# Patient Record
Sex: Female | Born: 1950
Health system: Southern US, Community
[De-identification: ages and names within clinical notes are randomized; demographics above are authoritative.]

## PROBLEM LIST (undated history)

## (undated) DIAGNOSIS — T7840XA Allergy, unspecified, initial encounter: Secondary | ICD-10-CM

## (undated) DIAGNOSIS — J45909 Unspecified asthma, uncomplicated: Secondary | ICD-10-CM

## (undated) DIAGNOSIS — J329 Chronic sinusitis, unspecified: Secondary | ICD-10-CM

## (undated) DIAGNOSIS — D649 Anemia, unspecified: Secondary | ICD-10-CM

## (undated) HISTORY — DX: Allergy, unspecified, initial encounter: T78.40XA

## (undated) HISTORY — DX: Anemia, unspecified: D64.9

## (undated) HISTORY — PX: OTHER SURGICAL HISTORY: SHX169

## (undated) HISTORY — PX: POLYPECTOMY: SHX149

---

## 2004-09-04 ENCOUNTER — Emergency Department (HOSPITAL_COMMUNITY): Admission: EM | Admit: 2004-09-04 | Discharge: 2004-09-04 | Payer: Self-pay | Admitting: Emergency Medicine

## 2005-04-29 ENCOUNTER — Ambulatory Visit: Payer: Self-pay | Admitting: Gastroenterology

## 2005-05-12 ENCOUNTER — Ambulatory Visit: Payer: Self-pay | Admitting: Gastroenterology

## 2005-05-13 ENCOUNTER — Ambulatory Visit: Payer: Self-pay | Admitting: Gastroenterology

## 2005-05-18 ENCOUNTER — Emergency Department (HOSPITAL_COMMUNITY): Admission: EM | Admit: 2005-05-18 | Discharge: 2005-05-18 | Payer: Self-pay | Admitting: Emergency Medicine

## 2006-08-12 ENCOUNTER — Ambulatory Visit (HOSPITAL_COMMUNITY): Admission: RE | Admit: 2006-08-12 | Discharge: 2006-08-12 | Payer: Self-pay | Admitting: Otolaryngology

## 2006-08-12 ENCOUNTER — Encounter (INDEPENDENT_AMBULATORY_CARE_PROVIDER_SITE_OTHER): Payer: Self-pay | Admitting: Specialist

## 2007-03-03 ENCOUNTER — Other Ambulatory Visit: Admission: RE | Admit: 2007-03-03 | Discharge: 2007-03-03 | Payer: Self-pay | Admitting: Internal Medicine

## 2007-12-30 ENCOUNTER — Encounter: Admission: RE | Admit: 2007-12-30 | Discharge: 2007-12-30 | Payer: Self-pay | Admitting: Internal Medicine

## 2008-04-11 ENCOUNTER — Other Ambulatory Visit: Admission: RE | Admit: 2008-04-11 | Discharge: 2008-04-11 | Payer: Self-pay | Admitting: Internal Medicine

## 2009-04-16 ENCOUNTER — Other Ambulatory Visit: Admission: RE | Admit: 2009-04-16 | Discharge: 2009-04-16 | Payer: Self-pay | Admitting: Internal Medicine

## 2009-12-03 ENCOUNTER — Encounter: Admission: RE | Admit: 2009-12-03 | Discharge: 2009-12-03 | Payer: Self-pay | Admitting: Internal Medicine

## 2010-07-14 ENCOUNTER — Encounter: Payer: Self-pay | Admitting: Internal Medicine

## 2010-11-08 NOTE — Op Note (Signed)
[name], Thompson           ACCOUNT NO.:  [id number]   MEDICAL RECORD NO.:  [id number]          PATIENT TYPE:  AMB   LOCATION:  SDS                          FACILITY:  [facility]   PHYSICIAN:  Kinnie Scales. Annalee Genta, M.D.DATE OF BIRTH:  05/28/1951   DATE OF PROCEDURE:  08/12/2006  DATE OF DISCHARGE:                               OPERATIVE REPORT   PRE AND POSTOPERATIVE DIAGNOSIS AND INDICATIONS FOR SURGERY:  1. Chronic sinusitis.  2. Nasal polyposis with airway obstruction.  3. Inferior turbinate hypertrophy.  4. Reactive airway disease.   SURGICAL PROCEDURES:  1. Bilateral endoscopic sinus surgery with computer-assisted      navigation (Stealth) consisting of bilateral total ethmoidectomy,      bilateral maxillary antrostomy with removal of diseased tissue,      bilateral nasal frontal recess exploration and right sphenoidotomy      with removal of tissue.  2. Bilateral inferior turbinate reduction.   SURGEON:  Kinnie Scales. Annalee Genta, M.D.   COMPLICATIONS:  None.   ANESTHESIA:  General endotracheal.   ESTIMATED BLOOD LOSS:  Approximately 200 mL.   The patient transferred from the operating room to recovery room in  stable condition.   BRIEF HISTORY:  [name] is a 60 year old black female who is  referred for evaluation of nasal airway obstruction and chronic  sinusitis.  She had undergone prior sinus surgery in the 1980s with an  initial improvement in symptoms of nasal airway and obstruction.  Over  the last five years, she has had increasing nasal blockage and recurrent  sinusitis.  She has a history of allergies and reactive airway disease  and has had difficulty managing her asthma because of worsening sinus  symptoms.  Examination in the office revealed massive nasal polyposis  with near complete bilateral nasal airway obstruction.  She was treated  with antibiotic therapy and oral and topical steroids and seen for  follow-up.  She had some improvement in breathing  but continued to have,  anosmia, nasal congestion and airway obstruction.  A CT scan was  obtained in the Stealth format for intraoperative computer-assisted  navigation and this revealed massive nasal polyposis involving the  entire nasal cavity maxillary, ethmoid, frontal and right sphenoid  sinuses.  After treatment CT scan consistent with chronic sinusitis and  nasal polyposis.  With these findings the patient was counseled  regarding surgical intervention and the above surgical procedure was  recommended.  Risk and possible complications of this procedure  discussed in detail with the patient and her husband.  They understood  and concurred with our plan for surgery which is scheduled on elective  basis at [facility] on August 12, 2006.   SURGICAL PROCEDURES:  The patient brought to the operating room at [facility] on August 12, 2006, placed in supine position on the  operating table.  General endotracheal anesthesia was established  without difficulty.  When the patient was adequately anesthetized, her  nose injected with total of 7 mL of 1% lidocaine 1:100,000 dilution of  epinephrine injected into the intranasal polyps on the lateral nasal  wall, inferior turbinates via  transoral sphenopalatine block.  A total  of 7 mL was injected.  Bilateral Afrin soaked cottonoid pledgets were  placed for hemostasis.  The patient was positioned on the operating  table, prepped and draped in sterile fashion.  The Stealth navigation  headgear was applied and anatomic and surgical landmarks were identified  and confirmed.  The Stealth device was used throughout the surgical  procedure to facilitate removal of polyps in a safe fashion.   With the patient prepared for surgery, a 0 degree endoscope was used on  the right-hand side to begin the sinus portion of the operation. Using a  0 degree endoscope and a straight microdebrider, polypoid disease was  resected from entire  nasal cavity to the level of the middle meatus.  With the nasal cavity patent, dissection was then carried out to the  middle meatus and anterior ethmoid region.  The ethmoid bulla was  identified and dissection was carried out preserving the middle  turbinate from posterior to anterior through the ethmoid region. Stealth  was used for guidance and a 0 degree scope and straight microdebrider  was used to remove all polypoid disease.  The posterior roof of the  ethmoid sinus was identified.  Dissection was then carried from  posterior to anterior removing bony septations and polyps, nasal frontal  recess was completely occluded as was the entire frontal sinus.  A 45  degrees telescope was used in this area with a curved microdebrider to  remove polyps.  A large amount of thick, mucinous material was aspirated  from the frontal sinus and the nasal frontal recess was widely patent.  Attention was then turned to the lateral nasal wall where the natural  ostium of the maxillary sinus identified.  This completely occluded with  polyp disease.  Microdebrider was used to resect polyps from within the  sinus creating a widely patent maxillary sinus.  There was continued  polypoid disease within the superior nasal cavity and sphenoethmoid  recess.  This was resected using a 0 degrees telescope and the Stealth  for guidance.  Natural ostium of the sphenoid sinus was identified and  polypoid material was removed from within the sphenoid ostium and  sphenoid sinus.   Attention then turned the left nasal passageway.  Again using a 0  degrees telescope and microdebrider, extensive polypoid disease was  resected from the entire nasal cavity, creating a patent nasal cavity,  identifying the natural anatomy including the middle turbinate and  anterior ethmoid region.  Dissection was then carried out through the  ethmoid sinus from anterior to posterior removing bony septations and significant diseased  polyps.  The posterior ethmoid region was  identified and cleared of polypoid disease and then resection was  carried from posterior to anterior on the roof of the ethmoid using 45  degrees telescope and curved microdebrider.  The nasal frontal recess  was identified.  Again this was completely occluded with polypoid  disease and resection was undertaken.  There was thick, mucinous  material which was aspirated and sent to pathology for evaluation of  allergic fungal mucin.  Lateral nasal wall was then inspected and  polypoid disease was removed.  The natural ostium of the maxillary sinus  was completely occluded and intramaxillary sinus disease was resected  using a curved microdebrider creating a widely patent maxillary sinus.   Attention was then turned to the inferior turbinates where bilateral  inferior turbinate reduction was performed.  Two passes were made in  submucosal  fashion with the bipolar cautery set at 12 watts.  The  turbinates were then outfractured and a small anterior incision was  created and turbinate bone was resected preserving the overlying mucosa.   With the sinus procedure completed, cottonoid pledgets were removed.  Sponge count was correct.  The patient's nasal cavity was thoroughly  irrigated and cleared of surgical debris.  A 50/50 mixture of Kenalog 40  and Bactroban cream was then instilled in the frontal ethmoid and  maxillary sinuses bilaterally and bilateral Kennedy sinus packs were  placed after the application of Bactroban ointment and hydrated with  sterile saline.  Oral cavity and  oropharynx were irrigated and suctioned.  Orogastric tube was passed and  the stomach contents were aspirated.  The patient awakened from her  anesthetic, extubated and transferred to the operating room to recovery  room in stable condition.  No complications.  Blood loss approximately  200 mL.           ______________________________  Kinnie Scales. Annalee Genta,  M.D.     DLS/MEDQ  D:  41/32/4401  T:  08/12/2006  Job:  [id number]

## 2010-12-26 ENCOUNTER — Other Ambulatory Visit: Payer: Self-pay | Admitting: Internal Medicine

## 2010-12-26 DIAGNOSIS — Z1231 Encounter for screening mammogram for malignant neoplasm of breast: Secondary | ICD-10-CM

## 2011-01-29 ENCOUNTER — Ambulatory Visit: Payer: Self-pay

## 2011-02-05 ENCOUNTER — Ambulatory Visit: Payer: Self-pay

## 2011-02-19 ENCOUNTER — Ambulatory Visit
Admission: RE | Admit: 2011-02-19 | Discharge: 2011-02-19 | Disposition: A | Discharge status: Home / Self Care | Payer: BC Managed Care – PPO | Source: Ambulatory Visit | Attending: Internal Medicine | Admitting: Internal Medicine

## 2011-02-19 DIAGNOSIS — Z1231 Encounter for screening mammogram for malignant neoplasm of breast: Secondary | ICD-10-CM

## 2012-03-15 ENCOUNTER — Other Ambulatory Visit: Payer: Self-pay | Admitting: Internal Medicine

## 2012-03-15 DIAGNOSIS — Z1231 Encounter for screening mammogram for malignant neoplasm of breast: Secondary | ICD-10-CM

## 2012-04-16 ENCOUNTER — Ambulatory Visit: Payer: BC Managed Care – PPO

## 2012-05-19 ENCOUNTER — Ambulatory Visit
Admission: RE | Admit: 2012-05-19 | Discharge: 2012-05-19 | Disposition: A | Discharge status: Home / Self Care | Payer: BC Managed Care – PPO | Source: Ambulatory Visit | Attending: Internal Medicine | Admitting: Internal Medicine

## 2012-05-19 DIAGNOSIS — Z1231 Encounter for screening mammogram for malignant neoplasm of breast: Secondary | ICD-10-CM

## 2012-07-01 ENCOUNTER — Other Ambulatory Visit: Payer: Self-pay | Admitting: Internal Medicine

## 2012-07-01 ENCOUNTER — Other Ambulatory Visit (HOSPITAL_COMMUNITY)
Admission: RE | Admit: 2012-07-01 | Discharge: 2012-07-01 | Disposition: A | Discharge status: Home / Self Care | Payer: BC Managed Care – PPO | Source: Ambulatory Visit | Attending: Internal Medicine | Admitting: Internal Medicine

## 2012-07-01 DIAGNOSIS — Z1151 Encounter for screening for human papillomavirus (HPV): Secondary | ICD-10-CM | POA: Insufficient documentation

## 2012-07-01 DIAGNOSIS — Z01419 Encounter for gynecological examination (general) (routine) without abnormal findings: Secondary | ICD-10-CM | POA: Insufficient documentation

## 2013-07-05 ENCOUNTER — Encounter (HOSPITAL_COMMUNITY): Payer: Self-pay | Admitting: Emergency Medicine

## 2013-07-05 ENCOUNTER — Emergency Department (HOSPITAL_COMMUNITY)
Admission: EM | Admit: 2013-07-05 | Discharge: 2013-07-05 | Disposition: A | Discharge status: Home / Self Care | Payer: Worker's Compensation | Attending: Emergency Medicine | Admitting: Emergency Medicine

## 2013-07-05 DIAGNOSIS — J45909 Unspecified asthma, uncomplicated: Secondary | ICD-10-CM | POA: Insufficient documentation

## 2013-07-05 DIAGNOSIS — X58XXXA Exposure to other specified factors, initial encounter: Secondary | ICD-10-CM | POA: Insufficient documentation

## 2013-07-05 DIAGNOSIS — Z23 Encounter for immunization: Secondary | ICD-10-CM | POA: Insufficient documentation

## 2013-07-05 DIAGNOSIS — Y9289 Other specified places as the place of occurrence of the external cause: Secondary | ICD-10-CM | POA: Insufficient documentation

## 2013-07-05 DIAGNOSIS — S058X9A Other injuries of unspecified eye and orbit, initial encounter: Secondary | ICD-10-CM | POA: Insufficient documentation

## 2013-07-05 DIAGNOSIS — Y9389 Activity, other specified: Secondary | ICD-10-CM | POA: Insufficient documentation

## 2013-07-05 DIAGNOSIS — IMO0002 Reserved for concepts with insufficient information to code with codable children: Secondary | ICD-10-CM | POA: Insufficient documentation

## 2013-07-05 DIAGNOSIS — S0500XA Injury of conjunctiva and corneal abrasion without foreign body, unspecified eye, initial encounter: Secondary | ICD-10-CM

## 2013-07-05 DIAGNOSIS — Z79899 Other long term (current) drug therapy: Secondary | ICD-10-CM | POA: Insufficient documentation

## 2013-07-05 HISTORY — DX: Unspecified asthma, uncomplicated: J45.909

## 2013-07-05 MED ORDER — TETRACAINE HCL 0.5 % OP SOLN
2.0000 [drp] | Freq: Once | OPHTHALMIC | Status: AC
  Administered 2013-07-05: 2 [drp] via OPHTHALMIC
  Filled 2013-07-05: qty 2

## 2013-07-05 MED ORDER — FLUORESCEIN SODIUM 1 MG OP STRP
1.0000 | ORAL_STRIP | Freq: Once | OPHTHALMIC | Status: AC
  Administered 2013-07-05: 1 via OPHTHALMIC
  Filled 2013-07-05: qty 1

## 2013-07-05 MED ORDER — TETANUS-DIPHTH-ACELL PERTUSSIS 5-2.5-18.5 LF-MCG/0.5 IM SUSP
0.5000 mL | Freq: Once | INTRAMUSCULAR | Status: AC
  Administered 2013-07-05: 0.5 mL via INTRAMUSCULAR
  Filled 2013-07-05: qty 0.5

## 2013-07-05 MED ORDER — CIPROFLOXACIN HCL 0.3 % OP SOLN: OPHTHALMIC | Status: DC

## 2013-07-05 NOTE — Progress Notes (Signed)
[facility] provided pt with a list of primary care resources and [facility] information.  °
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
## 2013-07-05 NOTE — Discharge Instructions (Signed)
Please call and set-up an appointment with eye doctor, please call first thing tomorrow morning Please rest and stay hydrated Please avoid rubbing the eye Please apply cool compressions Please take medication drops as prescribed Please continue to monitor symptoms closely and if symptoms are to worsen or change (fever greater than 101, neck pain, neck stiffness, swelling to the eye, tearing, drainage from the eye, pus drainage from the eye, redness, worsening pain, inability to see out of the eye, loss of vision, changes to vision) please report back to the ED   Corneal Abrasion The cornea is the clear covering at the front and center of the eye. When looking at the colored portion of the eye (iris), you are looking through the cornea. This very thin tissue is made up of many layers. The surface layer is a single layer of cells (corneal epithelium) and is one of the most sensitive tissues in the body. If a scratch or injury causes the corneal epithelium to come off, it is called a corneal abrasion. If the injury extends to the tissues below the epithelium, the condition is called a corneal ulcer. CAUSES   Scratches.  Trauma.  Foreign body in the eye. Some people have recurrences of abrasions in the area of the original injury even after it has healed (recurrent erosion syndrome). Recurrent erosion syndrome generally improves and goes away with time. SYMPTOMS   Eye pain.  Difficulty or inability to keep the injured eye open.  The eye becomes very sensitive to light.  Recurrent erosions tend to happen suddenly, first thing in the morning, usually after waking up and opening the eye. DIAGNOSIS  Your health care provider can diagnose a corneal abrasion during an eye exam. Dye is usually placed in the eye using a drop or a small paper strip moistened by your tears. When the eye is examined with a special light, the abrasion shows up clearly because of the dye. TREATMENT   Small abrasions  may be treated with antibiotic drops or ointment alone.  Usually a pressure patch is specially applied. Pressure patches prevent the eye from blinking, allowing the corneal epithelium to heal. A pressure patch also reduces the amount of pain present in the eye during healing. Most corneal abrasions heal within 2 3 days with no effect on vision. If the abrasion becomes infected and spreads to the deeper tissues of the cornea, a corneal ulcer can result. This is serious because it can cause corneal scarring. Corneal scars interfere with light passing through the cornea and cause a loss of vision in the involved eye. HOME CARE INSTRUCTIONS  Use medicine or ointment as directed. Only take over-the-counter or prescription medicines for pain, discomfort, or fever as directed by your health care provider.  Do not drive or operate machinery while your eye is patched. Your ability to judge distances is impaired.  If your health care provider has given you a follow-up appointment, it is very important to keep that appointment. Not keeping the appointment could result in a severe eye infection or permanent loss of vision. If there is any problem keeping the appointment, let your health care provider know. SEEK MEDICAL CARE IF:   You have pain, light sensitivity, and a scratchy feeling in one eye or both eyes.  Your pressure patch keeps loosening up, and you can blink your eye under the patch after treatment.  Any kind of discharge develops from the eye after treatment or if the lids stick together in the morning.  You have the same symptoms in the morning as you did with the original abrasion days, weeks, or months after the abrasion healed. MAKE SURE YOU:   Understand these instructions.  Will watch your condition.  Will get help right away if you are not doing well or get worse. Document Released: 06/06/2000 Document Revised: 03/30/2013 Document Reviewed: 02/14/2013 [facility] Patient Information  2014 [employer], [place].   Emergency Department Resource Guide 1) Find a Doctor and Pay Out of Pocket Although you won't have to find out who is covered by your insurance plan, it is a good idea to ask around and get recommendations. You will then need to call the office and see if the doctor you have chosen will accept you as a new patient and what types of options they offer for patients who are self-pay. Some doctors offer discounts or will set up payment plans for their patients who do not have insurance, but you will need to ask so you aren't surprised when you get to your appointment.  2) Contact Your Local Health Department Not all health departments have doctors that can see patients for sick visits, but many do, so it is worth a call to see if yours does. If you don't know where your local health department is, you can check in your phone book. The CDC also has a tool to help you locate your state's health department, and many state websites also have listings of all of their local health departments.  3) Find a Walk-in Clinic If your illness is not likely to be very severe or complicated, you may want to try a walk in clinic. These are popping up all over the country in pharmacies, drugstores, and shopping centers. They're usually staffed by nurse practitioners or physician assistants that have been trained to treat common illnesses and complaints. They're usually fairly quick and inexpensive. However, if you have serious medical issues or chronic medical problems, these are probably not your best option.  No Primary Care Doctor: - Call Health Connect at  [phone] - they can help you locate a primary care doctor that  accepts your insurance, provides certain services, etc. - Physician Referral Service- [phone]  Chronic Pain Problems: Organization         Address  Phone   Notes  [facility]  [phone] Patients need to be referred by their primary care doctor.    Medication Assistance: Organization         Address  Phone   Notes  [facility] [address] [place], [place] [address] [phone] --Must be a resident of [facility] [place] -- Must have NO insurance coverage whatsoever (no Medicaid/ Medicare, etc.) -- The pt. MUST have a primary care doctor that directs their care regularly and follows them in the community   MedAssist  [phone]   [employer]  [phone]    Agencies that provide inexpensive medical care: Organization         Address  Phone   Notes  [facility]  [phone]   [facility]    [phone]   [facility] [address] [place], [place] [address] [phone]) [phone]   [facility] [address], [place] [phone]   [facility]anned [facility]    [phone]   [facility]    [phone]   [facility]  [phone]  [address], [place] Phone:  [phone], Fax:  [phone] Hours of Operation:  9 am - 6 pm, M-F.  Also accepts Medicaid/Medicare and self-pay.  [facility]  [address], [place] Phone: [phone], Fax: [phone]. Hours of Operation:  8:30 am - 5:30 pm, M-F.  Also accepts Medicaid and self-pay.  [facility] [place] [address], [place] Phone: [phone]   [facility] [address] Natasha Bence [place], [place] [phone], Ext. [phone] Mondays & Thursdays: 7-9 AM.  First 15 patients are seen on a first come, first serve basis.    Medicaid-accepting [facility] Providers:  Organization         Address  Phone   Notes  [facility] [address], [place] [phone] Also accepts self-pay patients.  [facility] [address] Laurell Josephs [place], [place]  [phone]   [facility] [address], [place] [phone]   [facility] [address] [place] [phone]   Renaye Rakers [address], [place]   [phone] Only accepts [place] Access [place] patients after they have their name applied to their card.   Self-Pay (no insurance) in [facility]:  Organization         Address  Phone   Notes  Sickle Cell Patients, [facility] [address] [place], [place] [phone]   [facility] [address] [place], [place] [phone]   [facility]  [address], [place] [phone]   [facility]/Dr. Osei-Bonsu  [address], [place] or [address], [place] [phone] Phone number for both [place] and [place] locations is the same.  Urgent Medical and [facility] [address], [place] [phone]   [facility] [address], [place] or [address] [phone]   [facility] [address] [place] [phone], phone; [phone], fax Sees patients 1st and 3rd Saturday of every month.  Must not qualify for public or private insurance (i.e. Medicaid, Medicare, [place] Health Choice, Veterans' Benefits)  Household income should be no more than 200% of the poverty level The clinic cannot treat you if you are pregnant or think you are pregnant  Sexually transmitted diseases are not treated at the clinic.    Dental Care: Organization         Address  Phone  Notes  [facility] Department of [facility] [address] [place], [place] [phone] Accepts children up to age 2 who are enrolled in [place] or [place] Health Choice; pregnant women with a Medicaid card; and children who have applied for Medicaid or [place] Health Choice, but were declined, whose parents can pay a reduced fee at time of service.    [facility] Department of [facility]  [address], [place] [phone] Accepts children up to age 81 who are enrolled in [place] or [place] Health Choice; pregnant women with a Medicaid card; and children who have applied for Medicaid or [place] Health Choice, but were declined, whose parents can pay a reduced fee at time of service.  [facility] Adult Dental Access PROGRAM  [address] [place], [place] [phone]  Patients are seen by appointment only. Walk-ins are not accepted. [place] will see patients 27 years of age and older. Monday - Tuesday (8am-5pm) Most Wednesdays (8:30-5pm) $30 per visit, cash only  [facility] Adult [facility] Access PROGRAM  [address], [facility] [phone] Patients are seen by appointment only. Walk-ins are not accepted. [place] will see patients 61 years of age and older. One Wednesday Evening (Monthly: Volunteer Based).  $30 per visit, cash only  [place]  [phone] for adults; Children under age 42, call Graduate Pediatric Dentistry at [phone]. Children aged 64-14, please call [phone] to request a pediatric application.  Dental services are provided in all areas of dental care including fillings, crowns and bridges, complete and partial dentures, implants, gum treatment, root canals, and extractions. Preventive care is also provided. Treatment is provided to both adults and children. Patients are selected via a lottery and there is often a waiting list.   [facility] [address], [place]  [phone] [email or web address]   [facility] [address] [place], [place] [phone], Ext. [phone] Second and Fourth Thursday of each month, opens at 6:30 AM; Clinic ends at 9 AM.  Patients are seen on a first-come first-served basis, and a limited number are seen during each clinic.   [facility]  [address] Hillard Danker [place], [place] [phone]   Eligibility Requirements You must have lived in [place], [place], or [place] counties for at least the last three months.   You cannot be eligible for state or federal sponsored [employer], including [employer], [place], or [employer].   You generally cannot be eligible for healthcare insurance through your employer.    How to apply: Eligibility screenings are held every Tuesday and Wednesday afternoon from 1:00 pm until 4:00 pm. You do not need an appointment for the interview!  [facility] [address], [place], [place]   [place]  [place] Department  [place]  [phone]    Behavioral Health Resources in the Community: Intensive Outpatient Programs Organization         Address  Phone  Notes  [facility] [place]. [address] [place], [place] [phone]   [facility] [address] [place], [place]   ADS: Alcohol & Drug Svcs [address] [place], [place]   [place] [address],  [place], [place] or [phone]   Substance Abuse Resources Organization         Address  Phone  Notes  Alcohol and Drug Services  [phone]   [place]  [phone]   [facility] [place]   Chinita Pester  [phone]   Residential & Outpatient Substance Abuse Program  [phone]   Psychological Services Organization         Address  Phone  Notes  [facility] [place]  [place]  [phone]   [place] [address] [place] or [phone]    Mobile Crisis Teams Organization         Address  Phone  Notes  [facility], Mobile Crisis Care Unit  [phone]   [facility]  [address] [place], [place]   [facility] [address], [place]  [place] [phone]    Self-Help/Support Groups Organization         Address  Phone             Notes  [facility]ental Health Assoc[facility] of [place] - variety of support groups  [place] Call for more information  Narcotics Anonymous (NA), Caring [facility] [address], [employer] [place]  2 meetings at this location   Special educational needs teacher         Address  Phone  Notes  [facility] [place],    [place]  [phone]   [facility]  [address] [place] [id number], [place], [place]   [place], [place] [phone] Admissions: 8am-3pm M-F  Incentives Substance [place] [address]    [place], [place] [phone]   [facility] [address] [place], [place], [place]   [facility] [address]  [place], [place]   [facility] Programs - Intensive Outpatient [address] Kristeen [address], [place], [place]   [facility] [place].,  [place], [place] [phone] or [phone]   [facility] (RTS) [address], [place], [place] Accepts Medicaid  [facility] [address],  [place] [phone] Substance Abuse/Addiction Treatment   [facility] Organization         Address  Phone  Notes  [facility]  [phone]   Domenic Schwab, PhD [address] Arlis Porta [place], [place]   [phone] or [phone]   [facility] [place], [place] [phone]   [facility] [address], [place], [place] [phone] Insurance/Medicaid/sponsorship through [facility] and Families [address]                                    [place], [place] [phone]  [place] [address], [place] [phone]    Dr. Adele Schilder  [phone]   [facility] Dept. 1) [address] [place] 2) [place] 3)  [address], [place] [phone]  [phone]   [place] [phone] or [phone] (After Hours)
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
## 2013-07-08 NOTE — ED Provider Notes (Signed)
Medical screening examination/treatment/procedure(s) were performed by non-physician practitioner and as supervising physician I was immediately available for consultation/collaboration.  EKG Interpretation   None         Ephraim Hamburger, MD 07/08/13 [id number]

## 2013-07-15 ENCOUNTER — Other Ambulatory Visit: Payer: Self-pay

## 2013-07-15 DIAGNOSIS — Z1231 Encounter for screening mammogram for malignant neoplasm of breast: Secondary | ICD-10-CM

## 2013-07-31 ENCOUNTER — Emergency Department (HOSPITAL_COMMUNITY)
Admission: EM | Admit: 2013-07-31 | Discharge: 2013-07-31 | Disposition: A | Discharge status: Home / Self Care | Payer: Medicare Other | Attending: Emergency Medicine | Admitting: Emergency Medicine

## 2013-07-31 ENCOUNTER — Encounter (HOSPITAL_COMMUNITY): Payer: Self-pay | Admitting: Emergency Medicine

## 2013-07-31 DIAGNOSIS — J45909 Unspecified asthma, uncomplicated: Secondary | ICD-10-CM | POA: Insufficient documentation

## 2013-07-31 DIAGNOSIS — Z79899 Other long term (current) drug therapy: Secondary | ICD-10-CM | POA: Insufficient documentation

## 2013-07-31 DIAGNOSIS — J3489 Other specified disorders of nose and nasal sinuses: Secondary | ICD-10-CM | POA: Insufficient documentation

## 2013-07-31 DIAGNOSIS — X58XXXA Exposure to other specified factors, initial encounter: Secondary | ICD-10-CM | POA: Insufficient documentation

## 2013-07-31 DIAGNOSIS — Y939 Activity, unspecified: Secondary | ICD-10-CM | POA: Insufficient documentation

## 2013-07-31 DIAGNOSIS — S058X9A Other injuries of unspecified eye and orbit, initial encounter: Secondary | ICD-10-CM | POA: Insufficient documentation

## 2013-07-31 DIAGNOSIS — IMO0002 Reserved for concepts with insufficient information to code with codable children: Secondary | ICD-10-CM | POA: Insufficient documentation

## 2013-07-31 DIAGNOSIS — S0501XA Injury of conjunctiva and corneal abrasion without foreign body, right eye, initial encounter: Secondary | ICD-10-CM

## 2013-07-31 DIAGNOSIS — Z8669 Personal history of other diseases of the nervous system and sense organs: Secondary | ICD-10-CM | POA: Insufficient documentation

## 2013-07-31 DIAGNOSIS — Y929 Unspecified place or not applicable: Secondary | ICD-10-CM | POA: Insufficient documentation

## 2013-07-31 HISTORY — DX: Chronic sinusitis, unspecified: J32.9

## 2013-07-31 MED ORDER — FLUORESCEIN SODIUM 1 MG OP STRP
1.0000 | ORAL_STRIP | Freq: Once | OPHTHALMIC | Status: AC
  Administered 2013-07-31: 09:00:00 via OPHTHALMIC

## 2013-07-31 MED ORDER — TOBRAMYCIN 0.3 % OP SOLN: 2.0000 [drp] | OPHTHALMIC | Status: DC

## 2013-07-31 MED ORDER — PROPARACAINE HCL 0.5 % OP SOLN
1.0000 [drp] | Freq: Once | OPHTHALMIC | Status: AC
  Administered 2013-07-31: 1 [drp] via OPHTHALMIC
  Filled 2013-07-31: qty 15

## 2013-07-31 MED ORDER — HYDROCODONE-ACETAMINOPHEN 5-325 MG PO TABS: 1.0000 | ORAL_TABLET | ORAL | Status: DC | PRN

## 2013-07-31 NOTE — ED Notes (Signed)
Note:  uncorrected

## 2013-07-31 NOTE — ED Notes (Addendum)
Pt c/o R eye pain, light sensitivity, and blurred vision x 3 days.  Pain score 4/10.  Pink sclera and clear drainage noted.  Pt sts "I had this happen to my other eye before and they told me that my cornea was scratched."
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
## 2013-07-31 NOTE — Discharge Instructions (Signed)
Read the information below.  Use the prescribed medication as directed.  Please discuss all new medications with your pharmacist.  Do not take additional tylenol while taking the prescribed pain medication to avoid overdose.  You may return to the Emergency Department at any time for worsening condition or any new symptoms that concern you.  If you develop fevers, redness, swelling, uncontrolled pain, change in your vision, see the ophthalmologist above or return to the ER immediately for a recheck.     Corneal Abrasion The cornea is the clear covering at the front and center of the eye. When looking at the colored portion of the eye (iris), you are looking through the cornea. This very thin tissue is made up of many layers. The surface layer is a single layer of cells (corneal epithelium) and is one of the most sensitive tissues in the body. If a scratch or injury causes the corneal epithelium to come off, it is called a corneal abrasion. If the injury extends to the tissues below the epithelium, the condition is called a corneal ulcer. CAUSES   Scratches.  Trauma.  Foreign body in the eye. Some people have recurrences of abrasions in the area of the original injury even after it has healed (recurrent erosion syndrome). Recurrent erosion syndrome generally improves and goes away with time. SYMPTOMS   Eye pain.  Difficulty or inability to keep the injured eye open.  The eye becomes very sensitive to light.  Recurrent erosions tend to happen suddenly, first thing in the morning, usually after waking up and opening the eye. DIAGNOSIS  Your health care provider can diagnose a corneal abrasion during an eye exam. Dye is usually placed in the eye using a drop or a small paper strip moistened by your tears. When the eye is examined with a special light, the abrasion shows up clearly because of the dye. TREATMENT   Small abrasions may be treated with antibiotic drops or ointment alone.  Usually  a pressure patch is specially applied. Pressure patches prevent the eye from blinking, allowing the corneal epithelium to heal. A pressure patch also reduces the amount of pain present in the eye during healing. Most corneal abrasions heal within 2 3 days with no effect on vision. If the abrasion becomes infected and spreads to the deeper tissues of the cornea, a corneal ulcer can result. This is serious because it can cause corneal scarring. Corneal scars interfere with light passing through the cornea and cause a loss of vision in the involved eye. HOME CARE INSTRUCTIONS  Use medicine or ointment as directed. Only take over-the-counter or prescription medicines for pain, discomfort, or fever as directed by your health care provider.  Do not drive or operate machinery while your eye is patched. Your ability to judge distances is impaired.  If your health care provider has given you a follow-up appointment, it is very important to keep that appointment. Not keeping the appointment could result in a severe eye infection or permanent loss of vision. If there is any problem keeping the appointment, let your health care provider know. SEEK MEDICAL CARE IF:   You have pain, light sensitivity, and a scratchy feeling in one eye or both eyes.  Your pressure patch keeps loosening up, and you can blink your eye under the patch after treatment.  Any kind of discharge develops from the eye after treatment or if the lids stick together in the morning.  You have the same symptoms in the morning as  you did with the original abrasion days, weeks, or months after the abrasion healed. MAKE SURE YOU:   Understand these instructions.  Will watch your condition.  Will get help right away if you are not doing well or get worse. Document Released: 06/06/2000 Document Revised: 03/30/2013 Document Reviewed: 02/14/2013 [facility] Patient Information 2014 [place].

## 2013-08-02 NOTE — ED Provider Notes (Signed)
Medical screening examination/treatment/procedure(s) were performed by non-physician practitioner and as supervising physician I was immediately available for consultation/collaboration.  EKG Interpretation   None         Alfonzo Feller, DO 08/02/13 1148

## 2013-08-09 ENCOUNTER — Ambulatory Visit: Payer: Self-pay

## 2013-08-30 ENCOUNTER — Ambulatory Visit: Payer: Self-pay

## 2013-10-06 ENCOUNTER — Other Ambulatory Visit: Payer: Self-pay | Admitting: Internal Medicine

## 2013-10-06 DIAGNOSIS — E2839 Other primary ovarian failure: Secondary | ICD-10-CM

## 2013-10-27 ENCOUNTER — Ambulatory Visit
Admission: RE | Admit: 2013-10-27 | Discharge: 2013-10-27 | Disposition: A | Discharge status: Home / Self Care | Payer: Medicare Other | Source: Ambulatory Visit

## 2013-10-27 ENCOUNTER — Ambulatory Visit
Admission: RE | Admit: 2013-10-27 | Discharge: 2013-10-27 | Disposition: A | Discharge status: Home / Self Care | Payer: Medicare Other | Source: Ambulatory Visit | Attending: Internal Medicine | Admitting: Internal Medicine

## 2013-10-27 ENCOUNTER — Encounter (INDEPENDENT_AMBULATORY_CARE_PROVIDER_SITE_OTHER): Payer: Self-pay

## 2013-10-27 DIAGNOSIS — E2839 Other primary ovarian failure: Secondary | ICD-10-CM

## 2013-10-27 DIAGNOSIS — Z1231 Encounter for screening mammogram for malignant neoplasm of breast: Secondary | ICD-10-CM

## 2013-11-01 ENCOUNTER — Encounter (INDEPENDENT_AMBULATORY_CARE_PROVIDER_SITE_OTHER): Payer: Self-pay | Admitting: Ophthalmology

## 2013-11-09 ENCOUNTER — Encounter (INDEPENDENT_AMBULATORY_CARE_PROVIDER_SITE_OTHER): Payer: Self-pay | Admitting: Ophthalmology

## 2013-12-07 ENCOUNTER — Encounter (INDEPENDENT_AMBULATORY_CARE_PROVIDER_SITE_OTHER): Payer: Self-pay | Admitting: Ophthalmology

## 2014-01-06 ENCOUNTER — Encounter (INDEPENDENT_AMBULATORY_CARE_PROVIDER_SITE_OTHER): Payer: Medicare Other | Admitting: Ophthalmology

## 2014-01-06 DIAGNOSIS — H2510 Age-related nuclear cataract, unspecified eye: Secondary | ICD-10-CM

## 2014-01-06 DIAGNOSIS — H43819 Vitreous degeneration, unspecified eye: Secondary | ICD-10-CM

## 2014-01-06 DIAGNOSIS — H4420 Degenerative myopia, unspecified eye: Secondary | ICD-10-CM

## 2014-03-08 ENCOUNTER — Encounter (INDEPENDENT_AMBULATORY_CARE_PROVIDER_SITE_OTHER): Payer: Medicare Other | Admitting: Ophthalmology

## 2014-03-08 DIAGNOSIS — H35039 Hypertensive retinopathy, unspecified eye: Secondary | ICD-10-CM

## 2014-03-08 DIAGNOSIS — H4420 Degenerative myopia, unspecified eye: Secondary | ICD-10-CM

## 2014-03-08 DIAGNOSIS — I10 Essential (primary) hypertension: Secondary | ICD-10-CM

## 2014-03-08 DIAGNOSIS — H2510 Age-related nuclear cataract, unspecified eye: Secondary | ICD-10-CM

## 2014-05-10 ENCOUNTER — Encounter (INDEPENDENT_AMBULATORY_CARE_PROVIDER_SITE_OTHER): Payer: Medicare Other | Admitting: Ophthalmology

## 2014-05-10 DIAGNOSIS — H2513 Age-related nuclear cataract, bilateral: Secondary | ICD-10-CM

## 2014-05-10 DIAGNOSIS — H4423 Degenerative myopia, bilateral: Secondary | ICD-10-CM

## 2014-05-10 DIAGNOSIS — H35033 Hypertensive retinopathy, bilateral: Secondary | ICD-10-CM

## 2014-05-10 DIAGNOSIS — I10 Essential (primary) hypertension: Secondary | ICD-10-CM

## 2014-05-10 DIAGNOSIS — H43813 Vitreous degeneration, bilateral: Secondary | ICD-10-CM

## 2014-07-29 DIAGNOSIS — J321 Chronic frontal sinusitis: Secondary | ICD-10-CM | POA: Insufficient documentation

## 2014-07-29 DIAGNOSIS — J3489 Other specified disorders of nose and nasal sinuses: Secondary | ICD-10-CM | POA: Insufficient documentation

## 2014-07-29 DIAGNOSIS — IMO0001 Reserved for inherently not codable concepts without codable children: Secondary | ICD-10-CM | POA: Insufficient documentation

## 2014-08-17 ENCOUNTER — Encounter: Payer: Self-pay | Admitting: Gastroenterology

## 2014-09-13 ENCOUNTER — Ambulatory Visit (INDEPENDENT_AMBULATORY_CARE_PROVIDER_SITE_OTHER): Payer: Medicare Other | Admitting: Ophthalmology

## 2014-09-13 DIAGNOSIS — H35033 Hypertensive retinopathy, bilateral: Secondary | ICD-10-CM | POA: Diagnosis not present

## 2014-09-13 DIAGNOSIS — H4423 Degenerative myopia, bilateral: Secondary | ICD-10-CM | POA: Diagnosis not present

## 2014-09-13 DIAGNOSIS — H2513 Age-related nuclear cataract, bilateral: Secondary | ICD-10-CM | POA: Diagnosis not present

## 2014-09-13 DIAGNOSIS — I10 Essential (primary) hypertension: Secondary | ICD-10-CM | POA: Diagnosis not present

## 2014-09-13 DIAGNOSIS — H43813 Vitreous degeneration, bilateral: Secondary | ICD-10-CM

## 2014-09-14 ENCOUNTER — Other Ambulatory Visit (HOSPITAL_COMMUNITY): Payer: Self-pay | Admitting: Otolaryngology

## 2014-09-14 DIAGNOSIS — Z881 Allergy status to other antibiotic agents status: Secondary | ICD-10-CM

## 2014-09-21 ENCOUNTER — Ambulatory Visit (HOSPITAL_COMMUNITY)
Admission: RE | Admit: 2014-09-21 | Discharge: 2014-09-21 | Disposition: A | Discharge status: Home / Self Care | Payer: Medicare Other | Source: Ambulatory Visit | Attending: Otolaryngology | Admitting: Otolaryngology

## 2014-09-21 DIAGNOSIS — Z881 Allergy status to other antibiotic agents status: Secondary | ICD-10-CM

## 2014-09-21 DIAGNOSIS — J329 Chronic sinusitis, unspecified: Secondary | ICD-10-CM | POA: Diagnosis not present

## 2014-09-21 DIAGNOSIS — J339 Nasal polyp, unspecified: Secondary | ICD-10-CM | POA: Diagnosis not present

## 2014-10-20 ENCOUNTER — Ambulatory Visit (INDEPENDENT_AMBULATORY_CARE_PROVIDER_SITE_OTHER): Payer: Medicare Other | Admitting: Internal Medicine

## 2014-10-20 ENCOUNTER — Encounter: Payer: Self-pay | Admitting: Internal Medicine

## 2014-10-20 ENCOUNTER — Other Ambulatory Visit (INDEPENDENT_AMBULATORY_CARE_PROVIDER_SITE_OTHER): Payer: Medicare Other

## 2014-10-20 VITALS — BP 150/90 | HR 73 | Temp 97.8°F | Resp 16 | Ht 65.0 in | Wt 158.4 lb

## 2014-10-20 DIAGNOSIS — E789 Disorder of lipoprotein metabolism, unspecified: Secondary | ICD-10-CM

## 2014-10-20 DIAGNOSIS — Z0000 Encounter for general adult medical examination without abnormal findings: Secondary | ICD-10-CM

## 2014-10-20 DIAGNOSIS — R030 Elevated blood-pressure reading, without diagnosis of hypertension: Secondary | ICD-10-CM

## 2014-10-20 DIAGNOSIS — D563 Thalassemia minor: Secondary | ICD-10-CM | POA: Diagnosis not present

## 2014-10-20 DIAGNOSIS — J302 Other seasonal allergic rhinitis: Secondary | ICD-10-CM | POA: Diagnosis not present

## 2014-10-20 LAB — COMPREHENSIVE METABOLIC PANEL
ALT: 10 U/L (ref 0–35)
AST: 17 U/L (ref 0–37)
Albumin: 4.1 g/dL (ref 3.5–5.2)
Alkaline Phosphatase: 37 U/L — ABNORMAL LOW (ref 39–117)
BUN: 16 mg/dL (ref 6–23)
CO2: 31 mEq/L (ref 19–32)
Calcium: 9.7 mg/dL (ref 8.4–10.5)
Chloride: 102 mEq/L (ref 96–112)
Creatinine, Ser: 0.86 mg/dL (ref 0.40–1.20)
GFR: 84.85 mL/min (ref >60.00)
Glucose, Bld: 96 mg/dL (ref 70–99)
Potassium: 4.3 mEq/L (ref 3.5–5.1)
Sodium: 136 mEq/L (ref 135–145)
Total Bilirubin: 0.4 mg/dL (ref 0.2–1.2)
Total Protein: 7.4 g/dL (ref 6.0–8.3)

## 2014-10-20 LAB — CBC
HCT: 32.9 % — ABNORMAL LOW (ref 36.0–46.0)
Hemoglobin: 10.5 g/dL — ABNORMAL LOW (ref 12.0–15.0)
MCHC: 31.9 g/dL (ref 30.0–36.0)
MCV: 72.1 fl — ABNORMAL LOW (ref 78.0–100.0)
Platelets: 260 10*3/uL (ref 150.0–400.0)
RBC: 4.56 Mil/uL (ref 3.87–5.11)
RDW: 16 % — ABNORMAL HIGH (ref 11.5–15.5)
WBC: 4.9 10*3/uL (ref 4.0–10.5)

## 2014-10-20 LAB — LIPID PANEL
Cholesterol: 207 mg/dL — ABNORMAL HIGH (ref 0–200)
HDL: 75.9 mg/dL (ref >39.00)
LDL Cholesterol: 117 mg/dL — ABNORMAL HIGH (ref 0–99)
NonHDL: 131.1
Total CHOL/HDL Ratio: 3
Triglycerides: 72 mg/dL (ref 0.0–149.0)
VLDL: 14.4 mg/dL (ref 0.0–40.0)

## 2014-10-20 MED ORDER — ALBUTEROL SULFATE HFA 108 (90 BASE) MCG/ACT IN AERS: 2.0000 | INHALATION_SPRAY | Freq: Four times a day (QID) | RESPIRATORY_TRACT | Status: DC | PRN

## 2014-10-20 MED ORDER — BUDESONIDE 180 MCG/ACT IN AEPB: 1.0000 | INHALATION_SPRAY | Freq: Four times a day (QID) | RESPIRATORY_TRACT | Status: DC | PRN

## 2014-10-20 NOTE — Patient Instructions (Signed)
We will check on the blood work today and call you back with the results.   We have sent in the refills on the 2 inhalers to your insurance company.   We will continue to watch the blood pressure and see you back in about 6 months to check on it. It may be that it is starting to go up a little bit and we may eventually need to start a blood pressure medicine but not today.   Health Maintenance Adopting a healthy lifestyle and getting preventive care can go a long way to promote health and wellness. Talk with your health care provider about what schedule of regular examinations is right for you. This is a good chance for you to check in with your provider about disease prevention and staying healthy. In between checkups, there are plenty of things you can do on your own. Experts have done a lot of research about which lifestyle changes and preventive measures are most likely to keep you healthy. Ask your health care provider for more information. WEIGHT AND DIET  Eat a healthy diet  Be sure to include plenty of vegetables, fruits, low-fat dairy products, and lean protein.  Do not eat a lot of foods high in solid fats, added sugars, or salt.  Get regular exercise. This is one of the most important things you can do for your health.  Most adults should exercise for at least 150 minutes each week. The exercise should increase your heart rate and make you sweat (moderate-intensity exercise).  Most adults should also do strengthening exercises at least twice a week. This is in addition to the moderate-intensity exercise.  Maintain a healthy weight  Body mass index (BMI) is a measurement that can be used to identify possible weight problems. It estimates body fat based on height and weight. Your health care provider can help determine your BMI and help you achieve or maintain a healthy weight.  For females 72 years of age and older:   A BMI below 18.5 is considered underweight.  A BMI of 18.5  to 24.9 is normal.  A BMI of 25 to 29.9 is considered overweight.  A BMI of 30 and above is considered obese.  Watch levels of cholesterol and blood lipids  You should start having your blood tested for lipids and cholesterol at 66 years of age, then have this test every 5 years.  You may need to have your cholesterol levels checked more often if:  Your lipid or cholesterol levels are high.  You are older than 66 years of age.  You are at high risk for heart disease.  CANCER SCREENING   Lung Cancer  Lung cancer screening is recommended for adults 38-27 years old who are at high risk for lung cancer because of a history of smoking.  A yearly low-dose CT scan of the lungs is recommended for people who:  Currently smoke.  Have quit within the past 15 years.  Have at least a 30-pack-year history of smoking. A pack year is smoking an average of one pack of cigarettes a day for 1 year.  Yearly screening should continue until it has been 15 years since you quit.  Yearly screening should stop if you develop a health problem that would prevent you from having lung cancer treatment.  Breast Cancer  Practice breast self-awareness. This means understanding how your breasts normally appear and feel.  It also means doing regular breast self-exams. Let your health care provider know about  any changes, no matter how small.  If you are in your 20s or 30s, you should have a clinical breast exam (CBE) by a health care provider every 1-3 years as part of a regular health exam.  If you are 17 or older, have a CBE every year. Also consider having a breast X-ray (mammogram) every year.  If you have a family history of breast cancer, talk to your health care provider about genetic screening.  If you are at high risk for breast cancer, talk to your health care provider about having an MRI and a mammogram every year.  Breast cancer gene (BRCA) assessment is recommended for women who have  family members with BRCA-related cancers. BRCA-related cancers include:  Breast.  Ovarian.  Tubal.  Peritoneal cancers.  Results of the assessment will determine the need for genetic counseling and BRCA1 and BRCA2 testing. Cervical Cancer Routine pelvic examinations to screen for cervical cancer are no longer recommended for nonpregnant women who are considered low risk for cancer of the pelvic organs (ovaries, uterus, and vagina) and who do not have symptoms. A pelvic examination may be necessary if you have symptoms including those associated with pelvic infections. Ask your health care provider if a screening pelvic exam is right for you.   The Pap test is the screening test for cervical cancer for women who are considered at risk.  If you had a hysterectomy for a problem that was not cancer or a condition that could lead to cancer, then you no longer need Pap tests.  If you are older than 65 years, and you have had normal Pap tests for the past 10 years, you no longer need to have Pap tests.  If you have had past treatment for cervical cancer or a condition that could lead to cancer, you need Pap tests and screening for cancer for at least 20 years after your treatment.  If you no longer get a Pap test, assess your risk factors if they change (such as having a new sexual partner). This can affect whether you should start being screened again.  Some women have medical problems that increase their chance of getting cervical cancer. If this is the case for you, your health care provider may recommend more frequent screening and Pap tests.  The human papillomavirus (HPV) test is another test that may be used for cervical cancer screening. The HPV test looks for the virus that can cause cell changes in the cervix. The cells collected during the Pap test can be tested for HPV.  The HPV test can be used to screen women 69 years of age and older. Getting tested for HPV can extend the interval  between normal Pap tests from three to five years.  An HPV test also should be used to screen women of any age who have unclear Pap test results.  After 66 years of age, women should have HPV testing as often as Pap tests.  Colorectal Cancer  This type of cancer can be detected and often prevented.  Routine colorectal cancer screening usually begins at 66 years of age and continues through 66 years of age.  Your health care provider may recommend screening at an earlier age if you have risk factors for colon cancer.  Your health care provider may also recommend using home test kits to check for hidden blood in the stool.  A small camera at the end of a tube can be used to examine your colon directly (sigmoidoscopy or  colonoscopy). This is done to check for the earliest forms of colorectal cancer.  Routine screening usually begins at age 12.  Direct examination of the colon should be repeated every 5-10 years through 66 years of age. However, you may need to be screened more often if early forms of precancerous polyps or small growths are found. Skin Cancer  Check your skin from head to toe regularly.  Tell your health care provider about any new moles or changes in moles, especially if there is a change in a mole's shape or color.  Also tell your health care provider if you have a mole that is larger than the size of a pencil eraser.  Always use sunscreen. Apply sunscreen liberally and repeatedly throughout the day.  Protect yourself by wearing long sleeves, pants, a wide-brimmed hat, and sunglasses whenever you are outside. HEART DISEASE, DIABETES, AND HIGH BLOOD PRESSURE   Have your blood pressure checked at least every 1-2 years. High blood pressure causes heart disease and increases the risk of stroke.  If you are between 69 years and 57 years old, ask your health care provider if you should take aspirin to prevent strokes.  Have regular diabetes screenings. This involves  taking a blood sample to check your fasting blood sugar level.  If you are at a normal weight and have a low risk for diabetes, have this test once every three years after 66 years of age.  If you are overweight and have a high risk for diabetes, consider being tested at a younger age or more often. PREVENTING INFECTION  Hepatitis B  If you have a higher risk for hepatitis B, you should be screened for this virus. You are considered at high risk for hepatitis B if:  You were born in a country where hepatitis B is common. Ask your health care provider which countries are considered high risk.  Your parents were born in a high-risk country, and you have not been immunized against hepatitis B (hepatitis B vaccine).  You have HIV or AIDS.  You use needles to inject street drugs.  You live with someone who has hepatitis B.  You have had sex with someone who has hepatitis B.  You get hemodialysis treatment.  You take certain medicines for conditions, including cancer, organ transplantation, and autoimmune conditions. Hepatitis C  Blood testing is recommended for:  Everyone born from [birth year] through 1967.  Anyone with known risk factors for hepatitis C. Sexually transmitted infections (STIs)  You should be screened for sexually transmitted infections (STIs) including gonorrhea and chlamydia if:  You are sexually active and are younger than 66 years of age.  You are older than 66 years of age and your health care provider tells you that you are at risk for this type of infection.  Your sexual activity has changed since you were last screened and you are at an increased risk for chlamydia or gonorrhea. Ask your health care provider if you are at risk.  If you do not have HIV, but are at risk, it may be recommended that you take a prescription medicine daily to prevent HIV infection. This is called pre-exposure prophylaxis (PrEP). You are considered at risk if:  You are sexually active  and do not regularly use condoms or know the HIV status of your partner(s).  You take drugs by injection.  You are sexually active with a partner who has HIV. Talk with your health care provider about whether you are at high risk  of being infected with HIV. If you choose to begin PrEP, you should first be tested for HIV. You should then be tested every 3 months for as long as you are taking PrEP.  PREGNANCY   If you are premenopausal and you may become pregnant, ask your health care provider about preconception counseling.  If you may become pregnant, take 400 to 800 micrograms (mcg) of folic acid every day.  If you want to prevent pregnancy, talk to your health care provider about birth control (contraception). OSTEOPOROSIS AND MENOPAUSE   Osteoporosis is a disease in which the bones lose minerals and strength with aging. This can result in serious bone fractures. Your risk for osteoporosis can be identified using a bone density scan.  If you are 20 years of age or older, or if you are at risk for osteoporosis and fractures, ask your health care provider if you should be screened.  Ask your health care provider whether you should take a calcium or vitamin D supplement to lower your risk for osteoporosis.  Menopause may have certain physical symptoms and risks.  Hormone replacement therapy may reduce some of these symptoms and risks. Talk to your health care provider about whether hormone replacement therapy is right for you.  HOME CARE INSTRUCTIONS   Schedule regular health, dental, and eye exams.  Stay current with your immunizations.   Do not use any tobacco products including cigarettes, chewing tobacco, or electronic cigarettes.  If you are pregnant, do not drink alcohol.  If you are breastfeeding, limit how much and how often you drink alcohol.  Limit alcohol intake to no more than 1 drink per day for nonpregnant women. One drink equals 12 ounces of beer, 5 ounces of wine,  or 1 ounces of hard liquor.  Do not use street drugs.  Do not share needles.  Ask your health care provider for help if you need support or information about quitting drugs.  Tell your health care provider if you often feel depressed.  Tell your health care provider if you have ever been abused or do not feel safe at home. Document Released: 12/23/2010 Document Revised: 10/24/2013 Document Reviewed: 05/11/2013 [facility] Patient Information 2015 [place], [place]. This information is not intended to replace advice given to you by your health care provider. Make sure you discuss any questions you have with your health care provider.

## 2014-10-20 NOTE — Progress Notes (Signed)
Pre visit review using our clinic review tool, if applicable. No additional management support is needed unless otherwise documented below in the visit note. 

## 2014-10-22 DIAGNOSIS — R030 Elevated blood-pressure reading, without diagnosis of hypertension: Secondary | ICD-10-CM | POA: Insufficient documentation

## 2014-10-22 DIAGNOSIS — J309 Allergic rhinitis, unspecified: Secondary | ICD-10-CM | POA: Insufficient documentation

## 2014-10-22 DIAGNOSIS — D563 Thalassemia minor: Secondary | ICD-10-CM | POA: Insufficient documentation

## 2014-10-22 NOTE — Assessment & Plan Note (Signed)
She has been sick with bronchitis recently. Still may be taking some OTC meds. Recheck close to normal. Will monitor closely and if she has repeat elevated BP that would make 3 readings and could diagnose hypertension. Encouraged her to continue with her healthy lifestyle to help reduce her chance of hypertension.

## 2014-10-22 NOTE — Assessment & Plan Note (Signed)
Per patient baseline Hg around 10, check CBC today. No bleeding or dark stools.

## 2014-10-22 NOTE — Assessment & Plan Note (Signed)
She is taking claritin and flonase right now. Will add symbicort for the mild wheezing in her lungs (likely postbronchitic changes).

## 2014-10-22 NOTE — Progress Notes (Signed)
   Subjective:    Patient ID: Jessica Thompson, female    DOB: 1950-11-09, 64 y.o.   MRN: [id number]  HPI The patient is a 64 YO female who is coming in new to talk about her blood pressure. It has been high at one place recently and runs normal at home. She does not take medicine for it and does not have any symptoms including headaches, chest pains, SOB, nausea. She does exercise regularly and tries to eat low sodium by not adding salt to things and not buying pre-prepared foods.   PMH, [facility], social history reviewed with the patient.   Review of Systems  Constitutional: Negative for fever, activity change, appetite change and fatigue.  HENT: Positive for congestion. Negative for ear discharge, ear pain, postnasal drip, rhinorrhea, sinus pressure, sore throat and trouble swallowing.   Eyes: Negative.   Respiratory: Positive for cough and wheezing. Negative for chest tightness and shortness of breath.   Cardiovascular: Negative for chest pain, palpitations and leg swelling.  Gastrointestinal: Negative.   Musculoskeletal: Negative.   Skin: Negative.   Neurological: Negative.   Psychiatric/Behavioral: Negative.       Objective:   Physical Exam  Constitutional: She appears well-developed and well-nourished.  HENT:  Head: Normocephalic and atraumatic.  Eyes: EOM are normal.  Neck: Normal range of motion.  Cardiovascular: Normal rate and regular rhythm.   Pulmonary/Chest: Effort normal. She has wheezes.  Mild wheeze that partial clears with cough.   Abdominal: Soft.  Skin: Skin is warm and dry.    Filed Vitals:   10/20/14 0913 10/20/14 0955  BP: 170/100 150/90  Pulse: 73   Temp: 97.8 F (36.6 C)   TempSrc: Oral   Resp: 16   Height: 5\' 5"  (1.651 m)   Weight: 158 lb 6.4 oz (71.85 kg)   SpO2: 96%       Assessment & Plan:

## 2014-10-24 ENCOUNTER — Other Ambulatory Visit: Payer: Self-pay | Admitting: Geriatric Medicine

## 2014-10-24 ENCOUNTER — Telehealth: Payer: Self-pay | Admitting: Internal Medicine

## 2014-10-24 ENCOUNTER — Other Ambulatory Visit: Payer: Self-pay

## 2014-10-24 DIAGNOSIS — Z1231 Encounter for screening mammogram for malignant neoplasm of breast: Secondary | ICD-10-CM

## 2014-10-24 MED ORDER — ALBUTEROL SULFATE HFA 108 (90 BASE) MCG/ACT IN AERS: 2.0000 | INHALATION_SPRAY | Freq: Four times a day (QID) | RESPIRATORY_TRACT | Status: DC | PRN

## 2014-10-24 NOTE — Telephone Encounter (Signed)
md sent rx to [facility] resent to [facility]...Jessica Thompson

## 2014-10-24 NOTE — Telephone Encounter (Signed)
Sent to pharmacy 

## 2014-10-24 NOTE — Telephone Encounter (Signed)
Patient needs script for albuterol sent to [facility] on [address].  Script was sent to incorrect pharmacy.

## 2014-10-30 ENCOUNTER — Telehealth: Payer: Self-pay

## 2014-10-30 NOTE — Telephone Encounter (Signed)
Received pharmacy rejection stating that insurance will not cover ventolin  without a prior authorization. PA submitted to [facility] via covermymeds, approval now pending their decision.

## 2014-11-02 ENCOUNTER — Other Ambulatory Visit: Payer: Self-pay | Admitting: Geriatric Medicine

## 2014-11-02 MED ORDER — ALBUTEROL SULFATE HFA 108 (90 BASE) MCG/ACT IN AERS: 2.0000 | INHALATION_SPRAY | Freq: Four times a day (QID) | RESPIRATORY_TRACT | Status: DC | PRN

## 2014-11-10 ENCOUNTER — Ambulatory Visit
Admission: RE | Admit: 2014-11-10 | Discharge: 2014-11-10 | Disposition: A | Discharge status: Home / Self Care | Payer: Medicare Other | Source: Ambulatory Visit

## 2014-11-10 DIAGNOSIS — Z1231 Encounter for screening mammogram for malignant neoplasm of breast: Secondary | ICD-10-CM

## 2014-11-14 ENCOUNTER — Telehealth: Payer: Self-pay | Admitting: Internal Medicine

## 2014-11-14 NOTE — Telephone Encounter (Signed)
Received records from [place] forwarded to Dr. Vertell Novak 11/14/14 fbg.

## 2014-11-15 ENCOUNTER — Other Ambulatory Visit: Payer: Self-pay | Admitting: Otolaryngology

## 2014-12-22 ENCOUNTER — Other Ambulatory Visit: Payer: Self-pay | Admitting: Geriatric Medicine

## 2014-12-22 ENCOUNTER — Telehealth: Payer: Self-pay | Admitting: Internal Medicine

## 2014-12-22 MED ORDER — ALBUTEROL SULFATE HFA 108 (90 BASE) MCG/ACT IN AERS: 2.0000 | INHALATION_SPRAY | Freq: Four times a day (QID) | RESPIRATORY_TRACT | Status: DC | PRN

## 2014-12-22 NOTE — Telephone Encounter (Signed)
Patient called and insurance only needs verbal approval to fill her albuterol (PROVENTIL HFA;VENTOLIN HFA) 108 (90 BASE) MCG/ACT inhaler [[id number]]  Pharmacy is [facility] in [place] on [address].

## 2014-12-22 NOTE — Telephone Encounter (Signed)
Sent to pharmacy 

## 2015-02-22 ENCOUNTER — Encounter: Payer: Self-pay | Admitting: Gastroenterology

## 2015-03-01 ENCOUNTER — Telehealth: Payer: Self-pay | Admitting: Internal Medicine

## 2015-03-01 ENCOUNTER — Other Ambulatory Visit: Payer: Self-pay | Admitting: Geriatric Medicine

## 2015-03-01 MED ORDER — BUDESONIDE 180 MCG/ACT IN AEPB: 1.0000 | INHALATION_SPRAY | Freq: Four times a day (QID) | RESPIRATORY_TRACT | Status: DC | PRN

## 2015-03-01 NOTE — Telephone Encounter (Signed)
Pt request refill for budesonide (PULMICORT) 180 MCG/ACT inhaler to be send into [facility]. Pt stated she tried to get this done but we told the pharmacy that she is not in our system. Please help, pt need this done ASAP

## 2015-03-01 NOTE — Telephone Encounter (Signed)
Sent to pharmacy 

## 2015-03-28 ENCOUNTER — Ambulatory Visit: Payer: Medicare Other | Admitting: Allergy and Immunology

## 2015-04-03 ENCOUNTER — Ambulatory Visit: Payer: Medicare Other | Admitting: Allergy and Immunology

## 2015-04-24 ENCOUNTER — Ambulatory Visit: Payer: Self-pay | Admitting: Internal Medicine

## 2015-04-25 ENCOUNTER — Ambulatory Visit: Payer: Medicare Other | Admitting: Allergy and Immunology

## 2015-05-02 ENCOUNTER — Encounter: Payer: Self-pay | Admitting: Internal Medicine

## 2015-05-02 ENCOUNTER — Ambulatory Visit (INDEPENDENT_AMBULATORY_CARE_PROVIDER_SITE_OTHER): Payer: Medicare Other | Admitting: Internal Medicine

## 2015-05-02 ENCOUNTER — Ambulatory Visit (INDEPENDENT_AMBULATORY_CARE_PROVIDER_SITE_OTHER)
Admission: RE | Admit: 2015-05-02 | Discharge: 2015-05-02 | Disposition: A | Discharge status: Home / Self Care | Payer: Medicare Other | Source: Ambulatory Visit | Attending: Internal Medicine | Admitting: Internal Medicine

## 2015-05-02 VITALS — BP 138/88 | HR 77 | Temp 97.9°F | Resp 16 | Ht 66.0 in | Wt 158.1 lb

## 2015-05-02 DIAGNOSIS — Z23 Encounter for immunization: Secondary | ICD-10-CM

## 2015-05-02 DIAGNOSIS — Z111 Encounter for screening for respiratory tuberculosis: Secondary | ICD-10-CM

## 2015-05-02 DIAGNOSIS — Z0000 Encounter for general adult medical examination without abnormal findings: Secondary | ICD-10-CM | POA: Insufficient documentation

## 2015-05-02 NOTE — Progress Notes (Signed)
Pre visit review using our clinic review tool, if applicable. No additional management support is needed unless otherwise documented below in the visit note. 

## 2015-05-02 NOTE — Assessment & Plan Note (Signed)
Prevnar 13 given at visit, declines flu today. Up to date on other health maintenance. Does not want hepatitis C screening today. Talked to her about shingles. Counseled about regular exercise to help with her lungs to avoid flare and increase her stamina. 10 year screening recommendations given at visit. CXR for TB screening for her work given past PPD positive.

## 2015-05-02 NOTE — Patient Instructions (Signed)
We will check the chest x-ray today and finish your form and mail it to you.   Come back next year for a check up.   Health Maintenance, Female Adopting a healthy lifestyle and getting preventive care can go a long way to promote health and wellness. Talk with your health care provider about what schedule of regular examinations is right for you. This is a good chance for you to check in with your provider about disease prevention and staying healthy. In between checkups, there are plenty of things you can do on your own. Experts have done a lot of research about which lifestyle changes and preventive measures are most likely to keep you healthy. Ask your health care provider for more information. WEIGHT AND DIET  Eat a healthy diet  Be sure to include plenty of vegetables, fruits, low-fat dairy products, and lean protein.  Do not eat a lot of foods high in solid fats, added sugars, or salt.  Get regular exercise. This is one of the most important things you can do for your health.  Most adults should exercise for at least 150 minutes each week. The exercise should increase your heart rate and make you sweat (moderate-intensity exercise).  Most adults should also do strengthening exercises at least twice a week. This is in addition to the moderate-intensity exercise.  Maintain a healthy weight  Body mass index (BMI) is a measurement that can be used to identify possible weight problems. It estimates body fat based on height and weight. Your health care provider can help determine your BMI and help you achieve or maintain a healthy weight.  For females 29 years of age and older:   A BMI below 18.5 is considered underweight.  A BMI of 18.5 to 24.9 is normal.  A BMI of 25 to 29.9 is considered overweight.  A BMI of 30 and above is considered obese.  Watch levels of cholesterol and blood lipids  You should start having your blood tested for lipids and cholesterol at 66 years of age,  then have this test every 5 years.  You may need to have your cholesterol levels checked more often if:  Your lipid or cholesterol levels are high.  You are older than 66 years of age.  You are at high risk for heart disease.  CANCER SCREENING   Lung Cancer  Lung cancer screening is recommended for adults 31-27 years old who are at high risk for lung cancer because of a history of smoking.  A yearly low-dose CT scan of the lungs is recommended for people who:  Currently smoke.  Have quit within the past 15 years.  Have at least a 30-pack-year history of smoking. A pack year is smoking an average of one pack of cigarettes a day for 1 year.  Yearly screening should continue until it has been 15 years since you quit.  Yearly screening should stop if you develop a health problem that would prevent you from having lung cancer treatment.  Breast Cancer  Practice breast self-awareness. This means understanding how your breasts normally appear and feel.  It also means doing regular breast self-exams. Let your health care provider know about any changes, no matter how small.  If you are in your 20s or 30s, you should have a clinical breast exam (CBE) by a health care provider every 1-3 years as part of a regular health exam.  If you are 12 or older, have a CBE every year. Also consider having  a breast X-ray (mammogram) every year.  If you have a family history of breast cancer, talk to your health care provider about genetic screening.  If you are at high risk for breast cancer, talk to your health care provider about having an MRI and a mammogram every year.  Breast cancer gene (BRCA) assessment is recommended for women who have family members with BRCA-related cancers. BRCA-related cancers include:  Breast.  Ovarian.  Tubal.  Peritoneal cancers.  Results of the assessment will determine the need for genetic counseling and BRCA1 and BRCA2 testing. Cervical Cancer Your  health care provider may recommend that you be screened regularly for cancer of the pelvic organs (ovaries, uterus, and vagina). This screening involves a pelvic examination, including checking for microscopic changes to the surface of your cervix (Pap test). You may be encouraged to have this screening done every 3 years, beginning at age 27.  For women ages 30-65, health care providers may recommend pelvic exams and Pap testing every 3 years, or they may recommend the Pap and pelvic exam, combined with testing for human papilloma virus (HPV), every 5 years. Some types of HPV increase your risk of cervical cancer. Testing for HPV may also be done on women of any age with unclear Pap test results.  Other health care providers may not recommend any screening for nonpregnant women who are considered low risk for pelvic cancer and who do not have symptoms. Ask your health care provider if a screening pelvic exam is right for you.  If you have had past treatment for cervical cancer or a condition that could lead to cancer, you need Pap tests and screening for cancer for at least 20 years after your treatment. If Pap tests have been discontinued, your risk factors (such as having a new sexual partner) need to be reassessed to determine if screening should resume. Some women have medical problems that increase the chance of getting cervical cancer. In these cases, your health care provider may recommend more frequent screening and Pap tests. Colorectal Cancer  This type of cancer can be detected and often prevented.  Routine colorectal cancer screening usually begins at 66 years of age and continues through 66 years of age.  Your health care provider may recommend screening at an earlier age if you have risk factors for colon cancer.  Your health care provider may also recommend using home test kits to check for hidden blood in the stool.  A small camera at the end of a tube can be used to examine your  colon directly (sigmoidoscopy or colonoscopy). This is done to check for the earliest forms of colorectal cancer.  Routine screening usually begins at age 60.  Direct examination of the colon should be repeated every 5-10 years through 66 years of age. However, you may need to be screened more often if early forms of precancerous polyps or small growths are found. Skin Cancer  Check your skin from head to toe regularly.  Tell your health care provider about any new moles or changes in moles, especially if there is a change in a mole's shape or color.  Also tell your health care provider if you have a mole that is larger than the size of a pencil eraser.  Always use sunscreen. Apply sunscreen liberally and repeatedly throughout the day.  Protect yourself by wearing long sleeves, pants, a wide-brimmed hat, and sunglasses whenever you are outside. HEART DISEASE, DIABETES, AND HIGH BLOOD PRESSURE   High blood pressure  causes heart disease and increases the risk of stroke. High blood pressure is more likely to develop in:  People who have blood pressure in the high end of the normal range (130-139/85-89 mm Hg).  People who are overweight or obese.  People who are African American.  If you are 30-53 years of age, have your blood pressure checked every 3-5 years. If you are 41 years of age or older, have your blood pressure checked every year. You should have your blood pressure measured twice--once when you are at a hospital or clinic, and once when you are not at a hospital or clinic. Record the average of the two measurements. To check your blood pressure when you are not at a hospital or clinic, you can use:  An automated blood pressure machine at a pharmacy.  A home blood pressure monitor.  If you are between 56 years and 33 years old, ask your health care provider if you should take aspirin to prevent strokes.  Have regular diabetes screenings. This involves taking a blood sample to  check your fasting blood sugar level.  If you are at a normal weight and have a low risk for diabetes, have this test once every three years after 66 years of age.  If you are overweight and have a high risk for diabetes, consider being tested at a younger age or more often. PREVENTING INFECTION  Hepatitis B  If you have a higher risk for hepatitis B, you should be screened for this virus. You are considered at high risk for hepatitis B if:  You were born in a country where hepatitis B is common. Ask your health care provider which countries are considered high risk.  Your parents were born in a high-risk country, and you have not been immunized against hepatitis B (hepatitis B vaccine).  You have HIV or AIDS.  You use needles to inject street drugs.  You live with someone who has hepatitis B.  You have had sex with someone who has hepatitis B.  You get hemodialysis treatment.  You take certain medicines for conditions, including cancer, organ transplantation, and autoimmune conditions. Hepatitis C  Blood testing is recommended for:  Everyone born from [birth year] through 1967.  Anyone with known risk factors for hepatitis C. Sexually transmitted infections (STIs)  You should be screened for sexually transmitted infections (STIs) including gonorrhea and chlamydia if:  You are sexually active and are younger than 66 years of age.  You are older than 66 years of age and your health care provider tells you that you are at risk for this type of infection.  Your sexual activity has changed since you were last screened and you are at an increased risk for chlamydia or gonorrhea. Ask your health care provider if you are at risk.  If you do not have HIV, but are at risk, it may be recommended that you take a prescription medicine daily to prevent HIV infection. This is called pre-exposure prophylaxis (PrEP). You are considered at risk if:  You are sexually active and do not regularly use  condoms or know the HIV status of your partner(s).  You take drugs by injection.  You are sexually active with a partner who has HIV. Talk with your health care provider about whether you are at high risk of being infected with HIV. If you choose to begin PrEP, you should first be tested for HIV. You should then be tested every 3 months for as long as you  are taking PrEP.  PREGNANCY   If you are premenopausal and you may become pregnant, ask your health care provider about preconception counseling.  If you may become pregnant, take 400 to 800 micrograms (mcg) of folic acid every day.  If you want to prevent pregnancy, talk to your health care provider about birth control (contraception). OSTEOPOROSIS AND MENOPAUSE   Osteoporosis is a disease in which the bones lose minerals and strength with aging. This can result in serious bone fractures. Your risk for osteoporosis can be identified using a bone density scan.  If you are 36 years of age or older, or if you are at risk for osteoporosis and fractures, ask your health care provider if you should be screened.  Ask your health care provider whether you should take a calcium or vitamin D supplement to lower your risk for osteoporosis.  Menopause may have certain physical symptoms and risks.  Hormone replacement therapy may reduce some of these symptoms and risks. Talk to your health care provider about whether hormone replacement therapy is right for you.  HOME CARE INSTRUCTIONS   Schedule regular health, dental, and eye exams.  Stay current with your immunizations.   Do not use any tobacco products including cigarettes, chewing tobacco, or electronic cigarettes.  If you are pregnant, do not drink alcohol.  If you are breastfeeding, limit how much and how often you drink alcohol.  Limit alcohol intake to no more than 1 drink per day for nonpregnant women. One drink equals 12 ounces of beer, 5 ounces of wine, or 1 ounces of hard  liquor.  Do not use street drugs.  Do not share needles.  Ask your health care provider for help if you need support or information about quitting drugs.  Tell your health care provider if you often feel depressed.  Tell your health care provider if you have ever been abused or do not feel safe at home.   This information is not intended to replace advice given to you by your health care provider. Make sure you discuss any questions you have with your health care provider.   Document Released: 12/23/2010 Document Revised: 06/30/2014 Document Reviewed: 05/11/2013 Elsevier Interactive Patient Education [employer].

## 2015-05-02 NOTE — Progress Notes (Signed)
   Subjective:    Patient ID: Jessica Thompson, female    DOB: Jan 07, 1951, 64 y.o.   MRN: [id number]  HPI Here for medicare wellness, no new complaints.   Diet: heart healthy  Physical activity: sedentary Depression/mood screen: negative Hearing: intact to whispered voice Visual acuity: grossly normal, performs annual eye exam  ADLs: capable Fall risk: none Home safety: good Cognitive evaluation: intact to orientation, naming, recall and repetition EOL planning: adv directives discussed  I have personally reviewed and have noted 1. The patient's medical and social history - reviewed today no changes 2. Their use of alcohol, tobacco or illicit drugs 3. Their current medications and supplements 4. The patient's functional ability including ADL's, fall risks, home safety risks and hearing or visual impairment. 5. Diet and physical activities 6. Evidence for depression or mood disorders 7. Care team reviewed and updated (available in snapshot)  Review of Systems  Constitutional: Negative for fever, activity change, appetite change and fatigue.  HENT: Negative for congestion, ear discharge, ear pain, postnasal drip, rhinorrhea, sinus pressure, sore throat and trouble swallowing.   Eyes: Negative.   Respiratory: Negative for cough, chest tightness, shortness of breath and wheezing.   Cardiovascular: Negative for chest pain, palpitations and leg swelling.  Gastrointestinal: Negative.   Musculoskeletal: Negative.   Skin: Negative.   Neurological: Negative.   Psychiatric/Behavioral: Negative.       Objective:   Physical Exam  Constitutional: She is oriented to person, place, and time. She appears well-developed and well-nourished.  HENT:  Head: Normocephalic and atraumatic.  Eyes: EOM are normal.  Neck: Normal range of motion.  Cardiovascular: Normal rate and regular rhythm.   Pulmonary/Chest: Effort normal. She has no wheezes.  Abdominal: Soft.  Musculoskeletal: She  exhibits no edema.  Neurological: She is alert and oriented to person, place, and time.  Skin: Skin is warm and dry.   Filed Vitals:   05/02/15 1055  BP: 138/88  Pulse: 77  Temp: 97.9 F (36.6 C)  TempSrc: Oral  Resp: 16  Height: 5\' 6"  (1.676 m)  Weight: 158 lb 1.9 oz (71.723 kg)  SpO2: 99%      Assessment & Plan:  Prevnar 13 given at visit.

## 2015-05-22 ENCOUNTER — Ambulatory Visit: Payer: Medicare Other | Admitting: Allergy and Immunology

## 2015-06-02 ENCOUNTER — Ambulatory Visit (INDEPENDENT_AMBULATORY_CARE_PROVIDER_SITE_OTHER): Payer: Medicare Other | Admitting: Family

## 2015-06-02 ENCOUNTER — Encounter: Payer: Self-pay | Admitting: Family

## 2015-06-02 VITALS — BP 150/82 | HR 89 | Temp 98.2°F | Wt 157.0 lb

## 2015-06-02 DIAGNOSIS — J069 Acute upper respiratory infection, unspecified: Secondary | ICD-10-CM | POA: Insufficient documentation

## 2015-06-02 MED ORDER — PREDNISONE 10 MG PO TABS: 10.0000 mg | ORAL_TABLET | Freq: Every day | ORAL | Status: DC

## 2015-06-02 NOTE — Patient Instructions (Signed)
Thank you for choosing [facility].  Summary/Instructions:  If your symptoms worsen or fail to improve, please contact our office for further instruction, or in case of emergency go directly to the emergency room at the closest medical facility.    General Recommendations:    Please drink plenty of fluids.  Get plenty of rest   Sleep in humidified air  Use saline nasal sprays  Netti pot   OTC Medications:  Decongestants - helps relieve congestion   Flonase (generic fluticasone) or Nasacort (generic triamcinolone) - please make sure to use the "cross-over" technique at a 45 degree angle towards the opposite eye as opposed to straight up the nasal passageway.   Sudafed (generic pseudoephedrine - Note this is the one that is available behind the pharmacy counter); Products with phenylephrine (-PE) may also be used but is often not as effective as pseudoephedrine.   If you have HIGH BLOOD PRESSURE - Coricidin HBP; AVOID any product that is -D as this contains pseudoephedrine which may increase your blood pressure.  Afrin (oxymetazoline) every 6-8 hours for up to 3 days.   Allergies - helps relieve runny nose, itchy eyes and sneezing   Claritin (generic loratidine), Allegra (fexofenidine), or Zyrtec (generic cyrterizine) for runny nose. These medications should not cause drowsiness.  Note - Benadryl (generic diphenhydramine) may be used however may cause drowsiness  Cough -   Delsym or Robitussin (generic dextromethorphan)  Expectorants - helps loosen mucus to ease removal   Mucinex (generic guaifenesin) as directed on the package.  Headaches / General Aches   Tylenol (generic acetaminophen) - DO NOT EXCEED 3 grams (3,000 mg) in a 24 hour time period  Advil/Motrin (generic ibuprofen)   Sore Throat -   Salt water gargle   Chloraseptic (generic benzocaine) spray or lozenges / Sucrets (generic dyclonine)     Upper Respiratory Infection, Adult Most  upper respiratory infections (URIs) are a viral infection of the air passages leading to the lungs. A URI affects the nose, throat, and upper air passages. The most common type of URI is nasopharyngitis and is typically referred to as "the common cold." URIs run their course and usually go away on their own. Most of the time, a URI does not require medical attention, but sometimes a bacterial infection in the upper airways can follow a viral infection. This is called a secondary infection. Sinus and middle ear infections are common types of secondary upper respiratory infections. Bacterial pneumonia can also complicate a URI. A URI can worsen asthma and chronic obstructive pulmonary disease (COPD). Sometimes, these complications can require emergency medical care and may be life threatening.  CAUSES Almost all URIs are caused by viruses. A virus is a type of germ and can spread from one person to another.  RISKS FACTORS You may be at risk for a URI if:  4. You smoke.  5. You have chronic heart or lung disease. 6. You have a weakened defense (immune) system.  39. You are very young or very old.  8. You have nasal allergies or asthma. 9. You work in crowded or poorly ventilated areas. 10. You work in health care facilities or schools. SIGNS AND SYMPTOMS  Symptoms typically develop 2-3 days after you come in contact with a cold virus. Most viral URIs last 7-10 days. However, viral URIs from the influenza virus (flu virus) can last 14-18 days and are typically more severe. Symptoms may include:  2. Runny or stuffy (congested) nose.  3. Sneezing.  4. Cough.  5. Sore throat.  6. Headache.  7. Fatigue.  8. Fever.  9. Loss of appetite.  10. Pain in your forehead, behind your eyes, and over your cheekbones (sinus pain). 11. Muscle aches.  DIAGNOSIS  Your health care provider may diagnose a URI by: 2. Physical exam. 3. Tests to check that your symptoms are not due to another condition  such as: 1. Strep throat. 2. Sinusitis. 3. Pneumonia. 4. Asthma. TREATMENT  A URI goes away on its own with time. It cannot be cured with medicines, but medicines may be prescribed or recommended to relieve symptoms. Medicines may help: 3. Reduce your fever. 4. Reduce your cough. 5. Relieve nasal congestion. HOME CARE INSTRUCTIONS  3. Take medicines only as directed by your health care provider.  4. Gargle warm saltwater or take cough drops to comfort your throat as directed by your health care provider. 5. Use a warm mist humidifier or inhale steam from a shower to increase air moisture. This may make it easier to breathe. 6. Drink enough fluid to keep your urine clear or pale yellow.  7. Eat soups and other clear broths and maintain good nutrition.  8. Rest as needed.  9. Return to work when your temperature has returned to normal or as your health care provider advises. You may need to stay home longer to avoid infecting others. You can also use a face mask and careful hand washing to prevent spread of the virus. 10. Increase the usage of your inhaler if you have asthma.  11. Do not use any tobacco products, including cigarettes, chewing tobacco, or electronic cigarettes. If you need help quitting, ask your health care provider. PREVENTION  The best way to protect yourself from getting a cold is to practice good hygiene.  6. Avoid oral or hand contact with people with cold symptoms.  7. Wash your hands often if contact occurs.  There is no clear evidence that vitamin C, vitamin E, echinacea, or exercise reduces the chance of developing a cold. However, it is always recommended to get plenty of rest, exercise, and practice good nutrition.  SEEK MEDICAL CARE IF:   You are getting worse rather than better.   Your symptoms are not controlled by medicine.   You have chills.  You have worsening shortness of breath.  You have brown or red mucus.  You have yellow or brown  nasal discharge.  You have pain in your face, especially when you bend forward.  You have a fever.  You have swollen neck glands.  You have pain while swallowing.  You have white areas in the back of your throat. SEEK IMMEDIATE MEDICAL CARE IF:  2. You have severe or persistent: 1. Headache. 2. Ear pain. 3. Sinus pain. 4. Chest pain. 3. You have chronic lung disease and any of the following: 1. Wheezing. 2. Prolonged cough. 3. Coughing up blood. 4. A change in your usual mucus. 4. You have a stiff neck. 5. You have changes in your: 1. Vision. 2. Hearing. 3. Thinking. 4. Mood. MAKE SURE YOU:  3. Understand these instructions. 4. Will watch your condition. 5. Will get help right away if you are not doing well or get worse.   This information is not intended to replace advice given to you by your health care provider. Make sure you discuss any questions you have with your health care provider.   Document Released: 12/03/2000 Document Revised: 10/24/2014 Document Reviewed: 09/14/2013 Elsevier Interactive Patient Education 2016  [employer].

## 2015-06-02 NOTE — Progress Notes (Signed)
Subjective:    Patient ID: Jessica Thompson, female    DOB: 08-10-1950, 64 y.o.   MRN: [id number]  Chief Complaint  Patient presents with  . chest congestion    HPI:  Jessica Thompson is a 64 y.o. female who  has a past medical history of Asthma and Sinusitis. and presents today for an acute office visit.   This is a new problem. Associated symptoms of chest congestion, productive cough with purulent sputum, and wheezing has been going on for about 6 days. Modifying factors include saline nasal sprays, Mucinex, and her albuterol which did seem to help. Denies fevers. Over the course of the week the symptoms have gotten better.   Allergies  Allergen Reactions  . Cabbage Other (See Comments)    congestion  . Cheese Other (See Comments)    congestion  . Corn-Containing Products Other (See Comments)    congestion  . Erythromycin Other (See Comments)    Abdominal pain  . Peanut-Containing Drug Products Other (See Comments)    congestion  . Soy Allergy Other (See Comments)    congestion  . Wheat Bran Other (See Comments)    congestion  . Mushroom Extract Complex Hives, Rash and Other (See Comments)    Congestion      Current Outpatient Prescriptions on File Prior to Visit  Medication Sig Dispense Refill  . budesonide (PULMICORT) 180 MCG/ACT inhaler Inhale 1 puff into the lungs every 6 (six) hours as needed (wheezing). 3 Inhaler 4  . Calcium-Vitamin D (CALTRATE 600 PLUS-VIT D PO) Take 1 tablet by mouth daily.    Marland Kitchen loratadine (CLARITIN) 10 MG tablet Take 10 mg by mouth 2 (two) times daily.    . Multiple Vitamins-Minerals (CENTRUM SILVER ADULT 50+ PO) Take 1 tablet by mouth daily.    Marland Kitchen triamcinolone (NASACORT) 55 MCG/ACT AERO nasal inhaler Place 2 sprays into the nose daily.    Marland Kitchen albuterol (PROAIR HFA) 108 (90 BASE) MCG/ACT inhaler Inhale 2 puffs into the lungs every 6 (six) hours as needed for wheezing or shortness of breath. (Patient not taking: Reported on 05/02/2015) 1  Inhaler 3  . guaiFENesin (MUCINEX) 600 MG 12 hr tablet Take 600 mg by mouth 2 (two) times daily.     No current facility-administered medications on file prior to visit.    Review of Systems  Constitutional: Negative for fever and chills.  HENT: Positive for congestion. Negative for sinus pressure and sore throat.   Respiratory: Positive for cough and wheezing. Negative for chest tightness and shortness of breath.   Neurological: Negative for headaches.      Objective:    BP 150/82 mmHg  Pulse 89  Temp(Src) 98.2 F (36.8 C)  Wt 157 lb (71.215 kg)  SpO2 98% Nursing note and vital signs reviewed.  Physical Exam  Constitutional: She is oriented to person, place, and time. She appears well-developed and well-nourished. No distress.  HENT:  Right Ear: Hearing, tympanic membrane, external ear and ear canal normal.  Left Ear: Hearing, tympanic membrane, external ear and ear canal normal.  Nose: Nose normal.  Mouth/Throat: Uvula is midline, oropharynx is clear and moist and mucous membranes are normal.  Cardiovascular: Normal rate, regular rhythm, normal heart sounds and intact distal pulses.   Pulmonary/Chest: Effort normal and breath sounds normal.  Neurological: She is alert and oriented to person, place, and time.  Skin: Skin is warm and dry.  Psychiatric: She has a normal mood and affect. Her behavior is normal. Judgment  and thought content normal.       Assessment & Plan:   Problem List Items Addressed This Visit      Respiratory   Acute upper respiratory infection - Primary    Symptoms and exam consistent with upper respiratory infection. Continue conservative treatment with over the counter medications as needed for symptom relief and supportive care. Written prescription for prednisone provided for wheezing and cough. Follow up if symptoms worsen or fail to improve.       Relevant Medications   predniSONE (DELTASONE) 10 MG tablet

## 2015-06-02 NOTE — Assessment & Plan Note (Signed)
Symptoms and exam consistent with upper respiratory infection. Continue conservative treatment with over the counter medications as needed for symptom relief and supportive care. Written prescription for prednisone provided for wheezing and cough. Follow up if symptoms worsen or fail to improve.

## 2015-06-19 ENCOUNTER — Ambulatory Visit: Payer: Medicare Other | Admitting: Allergy and Immunology

## 2015-06-26 ENCOUNTER — Ambulatory Visit (INDEPENDENT_AMBULATORY_CARE_PROVIDER_SITE_OTHER): Payer: Medicare Other | Admitting: Allergy and Immunology

## 2015-06-26 ENCOUNTER — Encounter: Payer: Self-pay | Admitting: Allergy and Immunology

## 2015-06-26 ENCOUNTER — Ambulatory Visit: Payer: Medicare Other | Admitting: Allergy and Immunology

## 2015-06-26 VITALS — BP 122/52 | HR 80 | Temp 98.0°F | Resp 16 | Ht 63.58 in | Wt 153.9 lb

## 2015-06-26 DIAGNOSIS — J339 Nasal polyp, unspecified: Secondary | ICD-10-CM | POA: Insufficient documentation

## 2015-06-26 DIAGNOSIS — J4520 Mild intermittent asthma, uncomplicated: Secondary | ICD-10-CM | POA: Insufficient documentation

## 2015-06-26 DIAGNOSIS — J3089 Other allergic rhinitis: Secondary | ICD-10-CM | POA: Diagnosis not present

## 2015-06-26 MED ORDER — METHYLPREDNISOLONE ACETATE 80 MG/ML IJ SUSP
80.0000 mg | Freq: Once | INTRAMUSCULAR | Status: AC
  Administered 2015-06-26: 80 mg via INTRAMUSCULAR

## 2015-06-26 NOTE — Progress Notes (Signed)
[facility]    NEW PATIENT NOTE  Referring Provider: Hoyt Koch, * Primary Provider: Hoyt Koch, MD Date of office visit: 06/26/2015    Subjective:   Chief Complaint:  Jessica Thompson is a 65 y.o. female with a chief complaint of No chief complaint on file.  who presents to the clinic with the following problems:  HPI Comments:  Jessica Thompson presents this clinic on 06/26/2015 in evaluation of respiratory tract symptoms. She apparently has a history of asthma dating back many years that exacerbates a possibly 4 times per year for which she will use an inhaled steroid any short acting bronchodilator but has not required any systemic steroids to treat this condition. She rarely uses any short acting bronchodilator in a rescue mode. Provoking factors for her symptoms include exposure cats. She also has a history of nasal congestion and some occasional rhinorrhea and sniffing usually following exposure to dust and cats is been of long-standing nature. She's apparently had a polypectomy performed by Dr. Ernesto Rutherford in May 2016 with a good response. She did not have very good ability to smell prior to that polypectomy but since that polypectomy she is smelled well until this past week. She thinks that she may contracted a small cold with nasal congestion and sneezing and rhinorrhea and anosmia over the course of the past week. Jessica Thompson is undergone a course of immunotherapy back in the 1970s and 45s. She had a very good response regarding her atopic disease although she had to discontinue this therapy in the 1980s because she may of had a allergic reaction with administration of immunotherapy which may of involved her respiratory tract. She has tried some Singulair in the past but apparently this gave rise to blurred vision. She relies on the use of Pulmicort and Ventolin as needed and Claritin every day.   Past Medical History   Diagnosis Date  . Asthma   . Sinusitis     Past Surgical History  Procedure Laterality Date  . Sinus sugery      Current Outpatient Prescriptions on File Prior to Visit  Medication Sig Dispense Refill  . budesonide (PULMICORT) 180 MCG/ACT inhaler Inhale 1 puff into the lungs every 6 (six) hours as needed (wheezing). 3 Inhaler 4  . loratadine (CLARITIN) 10 MG tablet Take 10 mg by mouth 2 (two) times daily.    . Multiple Vitamins-Minerals (CENTRUM SILVER ADULT 50+ PO) Take 1 tablet by mouth daily.    Marland Kitchen albuterol (PROAIR HFA) 108 (90 BASE) MCG/ACT inhaler Inhale 2 puffs into the lungs every 6 (six) hours as needed for wheezing or shortness of breath. (Patient not taking: Reported on 05/02/2015) 1 Inhaler 3  . Calcium-Vitamin D (CALTRATE 600 PLUS-VIT D PO) Take 1 tablet by mouth daily. Reported on 06/26/2015    . guaiFENesin (MUCINEX) 600 MG 12 hr tablet Take 600 mg by mouth 2 (two) times daily. Reported on 06/26/2015    . predniSONE (DELTASONE) 10 MG tablet Take 1 tablet (10 mg total) by mouth daily with breakfast. (Patient not taking: Reported on 06/26/2015) 10 tablet 0  . triamcinolone (NASACORT) 55 MCG/ACT AERO nasal inhaler Place 2 sprays into the nose daily. Reported on 06/26/2015     No current facility-administered medications on file prior to visit.    Meds ordered this encounter  Medications  . methylPREDNISolone acetate (DEPO-MEDROL) injection 80 mg    Sig:     Allergies  Allergen Reactions  .  Cabbage Other (See Comments)    congestion  . Cheese Other (See Comments)    congestion  . Corn-Containing Products Other (See Comments)    congestion  . Erythromycin Other (See Comments)    Abdominal pain  . Peanut-Containing Drug Products Other (See Comments)    congestion  . Soy Allergy Other (See Comments)    congestion  . Wheat Bran Other (See Comments)    congestion  . Mushroom Extract Complex Hives, Rash and Other (See Comments)    Congestion     Review of systems  negative except as noted in HPI / PMHx or noted below:  Review of Systems  Constitutional: Negative.   HENT: Negative.   Eyes: Negative.   Respiratory: Negative.   Cardiovascular: Negative.   Gastrointestinal: Negative.   Genitourinary: Negative.   Musculoskeletal: Negative.   Skin: Negative.   Neurological: Negative.   Endo/Heme/Allergies: Negative.   Psychiatric/Behavioral: Negative.     Family History  Problem Relation Age of Onset  . Hyperlipidemia Mother   . Anemia Mother   . Hypertension Father   . Emphysema Father     Social History   Social History  . Marital Status: Married    Spouse Name: N/A  . Number of Children: N/A  . Years of Education: N/A   Occupational History  . Not on file.   Social History Main Topics  . Smoking status: Never Smoker   . Smokeless tobacco: Never Used  . Alcohol Use: No  . Drug Use: No  . Sexual Activity: Not on file   Other Topics Concern  . Not on file   Social History Narrative    Environmental and Social history  Living in a house with a dry environment, a dog located inside the household, carpeting in the bedroom, no plastic on the bed or pillow, and no smokers located inside the household   Objective:   Filed Vitals:   06/26/15 1348  BP: 122/52  Pulse: 80  Temp: 98 F (36.7 C)  Resp: 16   Height: 5' 3.58" (161.5 cm) Weight: 153 lb 14.1 oz (69.8 kg)  Physical Exam  Constitutional: She is well-developed, well-nourished, and in no distress. No distress.  Slightly nasal voice  HENT:  Head: Normocephalic. Head is without right periorbital erythema and without left periorbital erythema.  Right Ear: Tympanic membrane, external ear and ear canal normal.  Left Ear: Tympanic membrane, external ear and ear canal normal.  Nose: Mucosal edema (Erythematous) and rhinorrhea (Clear) present.  Mouth/Throat: Oropharynx is clear and moist and mucous membranes are normal. No oropharyngeal exudate.  Eyes: Conjunctivae and  lids are normal. Pupils are equal, round, and reactive to light.  Neck: Trachea normal. No tracheal deviation present. No thyromegaly present.  Cardiovascular: Normal rate, regular rhythm, S1 normal, S2 normal and normal heart sounds.   No murmur heard. Pulmonary/Chest: Effort normal. No stridor. No tachypnea. No respiratory distress. She has no wheezes. She has no rales. She exhibits no tenderness.  Abdominal: Soft. She exhibits no distension and no mass. There is no hepatosplenomegaly. There is no tenderness. There is no rebound and no guarding.  Musculoskeletal: She exhibits no edema or tenderness.  Lymphadenopathy:       Head (right side): No tonsillar adenopathy present.       Head (left side): No tonsillar adenopathy present.    She has no cervical adenopathy.    She has no axillary adenopathy.  Neurological: She is alert. Gait normal.  Skin: No rash  noted. She is not diaphoretic. No erythema. No pallor. Nails show no clubbing.  Psychiatric: Mood and affect normal.     Diagnostics:  Allergy skin tests were not performed secondary to the use of an antihistamine this morning  Spirometry was performed and demonstrated an FEV1 of 1.55 @ 79 % of predicted. Following the administration of nebulized albuterol her FEV1 rose to 1.76 which was 90% of predicted and [place] out to an increase in the FEV1 of 13%.  The patient had an Asthma Control Test with the following results:  .     Assessment and Plan:    1. Mild intermittent asthma, uncomplicated   2. Other allergic rhinitis   3. Nasal polyposis      1. Allergen avoidance measures - return for skin testing without antihistamine  2. Over-the-counter Nasacort or Rhinocort 1-2 sprays each nostril one time per day  3. "Action plan" for asthma flare up:   A. start Pulmicort 180 - 3 inhalations 3 times per day  B. use ProAir HFA or Ventolin HFA 2 puffs every 4-6 hours if needed  4. If needed:   A. nasal saline wash  B. OTC  Claritin or Zyrtec one time per day  5. Consider immunotherapy?  6. Depo-Medrol 80 mg IM today  Jessica Thompson appears to have some degree of respiratory tract inflammation that may be based upon her atopic state but her most recent event probably is secondary to a upper respiratory tract infection of viral etiology and we will treat her with a Depo-Medrol injection and assume she'll do well. She'll keep in contact with [place] noting her response. I will see her back in this clinic to define her aeroallergens hypersensitivity profile was skin testing sometime in the next several weeks without the use of any antihistamines. I will make a determination about how to proceed pending her response to medical therapy and the results of her diagnostic testing.  Allena Katz, MD [place]

## 2015-06-26 NOTE — Patient Instructions (Signed)
  1. Allergen avoidance measures - return for skin testing without antihistamine  2. Over-the-counter Nasacort or Rhinocort 1-2 sprays each nostril one time per day  3. "Action plan" for asthma flare up:   A. start Pulmicort 180 - 3 inhalations 3 times per day  B. use ProAir HFA or Ventolin HFA 2 puffs every 4-6 hours if needed  4. If needed:   A. nasal saline wash  B. OTC Claritin or Zyrtec one time per day  5. Consider immunotherapy?  6. Depo-Medrol 80 mg IM today

## 2015-07-09 ENCOUNTER — Telehealth: Payer: Self-pay | Admitting: Allergy and Immunology

## 2015-07-09 MED ORDER — AMOXICILLIN-POT CLAVULANATE 875-125 MG PO TABS
ORAL_TABLET | ORAL | Status: DC
Start: 2015-07-09 — End: 2015-07-24

## 2015-07-09 NOTE — Telephone Encounter (Signed)
PT CALLED AND SAID THAT SHE SEEN YOU ON THE THIRD AND SHE IS NO BETTER AND HAS CONGESTION IN HER CHEST AND COUGHING UP GRAYISH COLOR. SHE WOULD LIKE FOR YOU TO CALL IN ANITBOTIC. [place]. [phone] OR [phone].

## 2015-07-09 NOTE — Telephone Encounter (Signed)
Sent prescription to patients pharmacy and patient notified.

## 2015-07-09 NOTE — Telephone Encounter (Signed)
Please provide patient with AUGMENTIN 875 one tablet two times per day for 14 days

## 2015-07-09 NOTE — Telephone Encounter (Signed)
Dr Neldon Mc please advise.

## 2015-07-24 ENCOUNTER — Encounter: Payer: Self-pay | Admitting: Allergy and Immunology

## 2015-07-24 ENCOUNTER — Ambulatory Visit (INDEPENDENT_AMBULATORY_CARE_PROVIDER_SITE_OTHER): Payer: Medicare Other | Admitting: Allergy and Immunology

## 2015-07-24 VITALS — BP 122/78 | HR 72 | Resp 18

## 2015-07-24 DIAGNOSIS — J4520 Mild intermittent asthma, uncomplicated: Secondary | ICD-10-CM

## 2015-07-24 DIAGNOSIS — J3089 Other allergic rhinitis: Secondary | ICD-10-CM

## 2015-07-24 NOTE — Progress Notes (Signed)
Jessica Thompson returns to this clinic today to have skin testing performed. Since her last visit she did require a course of antibiotics to clear up what appeared to be a lingering sinus infection. Fortunately, this was successful in alleviating all of her upper respiratory tract symptoms. She's been very good about using a nasal steroid in the form of Nasacort and she does not have to activate an action plan for her asthma. Allergy skin testing was performed. He demonstrated hypersensitivity against grasses and house dust mite on epi cutaneous testing. She did not have any hypersensitivity identified on intradermal testing. She will performed house dust avoidance measures and we will see her back in this clinic in 12 weeks where she continues on her medical plan. She is also considering a course of immunotherapy at this point.

## 2015-09-18 ENCOUNTER — Ambulatory Visit (INDEPENDENT_AMBULATORY_CARE_PROVIDER_SITE_OTHER): Payer: Medicare Other | Admitting: Ophthalmology

## 2015-10-13 ENCOUNTER — Ambulatory Visit (INDEPENDENT_AMBULATORY_CARE_PROVIDER_SITE_OTHER): Payer: Medicare Other | Admitting: Family Medicine

## 2015-10-13 ENCOUNTER — Encounter: Payer: Self-pay | Admitting: Family Medicine

## 2015-10-13 VITALS — BP 144/92 | HR 68 | Temp 98.2°F | Ht 63.58 in | Wt 152.5 lb

## 2015-10-13 DIAGNOSIS — J45901 Unspecified asthma with (acute) exacerbation: Secondary | ICD-10-CM | POA: Insufficient documentation

## 2015-10-13 DIAGNOSIS — J4531 Mild persistent asthma with (acute) exacerbation: Secondary | ICD-10-CM

## 2015-10-13 MED ORDER — GUAIFENESIN-CODEINE 100-10 MG/5ML PO SYRP: 5.0000 mL | ORAL_SOLUTION | Freq: Every evening | ORAL | Status: DC | PRN

## 2015-10-13 MED ORDER — ALBUTEROL SULFATE (2.5 MG/3ML) 0.083% IN NEBU
5.0000 mg | INHALATION_SOLUTION | Freq: Once | RESPIRATORY_TRACT | Status: AC
  Administered 2015-10-13: 5 mg via RESPIRATORY_TRACT

## 2015-10-13 MED ORDER — AZITHROMYCIN 250 MG PO TABS: ORAL_TABLET | ORAL | Status: DC

## 2015-10-13 MED ORDER — PREDNISONE 20 MG PO TABS
ORAL_TABLET | ORAL | Status: DC
Start: 2015-10-13 — End: 2016-07-26

## 2015-10-13 NOTE — Addendum Note (Signed)
Addended by: Karle Barr on: 10/13/2015 09:39 AM   Modules accepted: Orders

## 2015-10-13 NOTE — Progress Notes (Signed)
Pre visit review using our clinic review tool, if applicable. No additional management support is needed unless otherwise documented below in the visit note. 

## 2015-10-13 NOTE — Patient Instructions (Signed)
Complete course of antibiotics. If shortness of breath not improving start prednsione taper. Cough suppressant at night as needed.  Use albuterol as needed and continue pulmicort as per usual.  Go to ER if severe shortness of breath.

## 2015-10-13 NOTE — Progress Notes (Signed)
   Subjective:    Patient ID: Jessica Thompson, female    DOB: 04/17/1951, 65 y.o.   MRN: [id number]  Cough This is a new problem. The current episode started in the past 7 days. The problem has been gradually worsening. The problem occurs constantly. The cough is productive of sputum and productive of purulent sputum. Associated symptoms include a fever, nasal congestion, postnasal drip and wheezing. Pertinent negatives include no ear congestion, ear pain, hemoptysis, myalgias, sore throat or shortness of breath. Associated symptoms comments: hoarseness. Risk factors: nonsmoker. Treatments tried: Pt has tried mucinex, nasacort, cough drops and cough syrup.  The treatment provided no relief. Her past medical history is significant for asthma.   Asthma: Pulmicort, claritin, now using albuterol more frequently twice a day.  Review of Systems  Constitutional: Positive for fever.  HENT: Positive for postnasal drip. Negative for ear pain and sore throat.   Respiratory: Positive for cough and wheezing. Negative for hemoptysis and shortness of breath.   Musculoskeletal: Negative for myalgias.       Objective:   Physical Exam  Constitutional: Vital signs are normal. She appears well-developed and well-nourished. She is cooperative.  Non-toxic appearance. She does not appear ill. No distress.  HENT:  Head: Normocephalic.  Right Ear: Hearing, tympanic membrane, external ear and ear canal normal. Tympanic membrane is not erythematous, not retracted and not bulging.  Left Ear: Hearing, tympanic membrane, external ear and ear canal normal. Tympanic membrane is not erythematous, not retracted and not bulging.  Nose: Mucosal edema and rhinorrhea present. Right sinus exhibits no maxillary sinus tenderness and no frontal sinus tenderness. Left sinus exhibits no maxillary sinus tenderness and no frontal sinus tenderness.  Mouth/Throat: Uvula is midline, oropharynx is clear and moist and mucous membranes are  normal.  Eyes: Conjunctivae, EOM and lids are normal. Pupils are equal, round, and reactive to light. Lids are everted and swept, no foreign bodies found.  Neck: Trachea normal and normal range of motion. Neck supple. Carotid bruit is not present. No thyroid mass and no thyromegaly present.  Cardiovascular: Normal rate, regular rhythm, S1 normal, S2 normal, normal heart sounds, intact distal pulses and normal pulses.  Exam reveals no gallop and no friction rub.   No murmur heard. Pulmonary/Chest: Effort normal. No tachypnea. No respiratory distress. She has decreased breath sounds. She has wheezes. She has no rhonchi. She has no rales.  Scattered wheeze, improved after neb  Neurological: She is alert.  Skin: Skin is warm, dry and intact. No rash noted.  Psychiatric: Her speech is normal and behavior is normal. Judgment normal. Her mood appears not anxious. Cognition and memory are normal. She does not exhibit a depressed mood.          Assessment & Plan:

## 2015-10-13 NOTE — Assessment & Plan Note (Signed)
Given worsening and purulent mucus at 1 week.. Will cover for bacterial infeciton. Pt given albuterol ne in office today. Pt currently refuses pred taper but will give rx for her to start if not improving as expected.

## 2015-10-15 ENCOUNTER — Telehealth: Payer: Self-pay

## 2015-10-15 MED ORDER — FLUTICASONE FUROATE 100 MCG/ACT IN AEPB: 1.0000 | INHALATION_SPRAY | Freq: Every day | RESPIRATORY_TRACT | Status: DC

## 2015-10-15 NOTE — Telephone Encounter (Signed)
PA initiated via CoverMyMeds key [id number]

## 2015-10-15 NOTE — Telephone Encounter (Signed)
PA DENIED, pt must first try and fail Arnuity Ellipta

## 2015-10-15 NOTE — Telephone Encounter (Signed)
Sent in arnuity, 1 puff daily.

## 2015-11-02 ENCOUNTER — Ambulatory Visit (INDEPENDENT_AMBULATORY_CARE_PROVIDER_SITE_OTHER): Payer: Medicare Other | Admitting: Ophthalmology

## 2015-11-02 DIAGNOSIS — H4423 Degenerative myopia, bilateral: Secondary | ICD-10-CM

## 2015-11-02 DIAGNOSIS — I10 Essential (primary) hypertension: Secondary | ICD-10-CM

## 2015-11-02 DIAGNOSIS — H35033 Hypertensive retinopathy, bilateral: Secondary | ICD-10-CM | POA: Diagnosis not present

## 2015-11-02 DIAGNOSIS — H43813 Vitreous degeneration, bilateral: Secondary | ICD-10-CM | POA: Diagnosis not present

## 2015-11-23 ENCOUNTER — Other Ambulatory Visit: Payer: Self-pay | Admitting: Internal Medicine

## 2016-01-04 ENCOUNTER — Other Ambulatory Visit: Payer: Self-pay | Admitting: Internal Medicine

## 2016-01-04 DIAGNOSIS — Z1231 Encounter for screening mammogram for malignant neoplasm of breast: Secondary | ICD-10-CM

## 2016-01-23 ENCOUNTER — Ambulatory Visit: Payer: Self-pay

## 2016-01-29 ENCOUNTER — Ambulatory Visit: Payer: Self-pay

## 2016-02-27 ENCOUNTER — Encounter: Payer: Self-pay | Admitting: Gastroenterology

## 2016-03-18 ENCOUNTER — Ambulatory Visit (AMBULATORY_SURGERY_CENTER): Payer: Self-pay

## 2016-03-18 VITALS — Ht 65.0 in | Wt 154.6 lb

## 2016-03-18 DIAGNOSIS — Z1211 Encounter for screening for malignant neoplasm of colon: Secondary | ICD-10-CM

## 2016-03-18 MED ORDER — NA SULFATE-K SULFATE-MG SULF 17.5-3.13-1.6 GM/177ML PO SOLN: ORAL | 0 refills | Status: DC

## 2016-03-18 NOTE — Progress Notes (Signed)
Pt is "ALLERGIC TO SOY"-CAUSES CONGESTION!   No allergies to egg products.Pt not taking any weight loss meds or using  O2 at home.

## 2016-03-28 ENCOUNTER — Ambulatory Visit (AMBULATORY_SURGERY_CENTER): Payer: Medicare Other | Admitting: Gastroenterology

## 2016-03-28 ENCOUNTER — Encounter: Payer: Self-pay | Admitting: Gastroenterology

## 2016-03-28 VITALS — BP 134/64 | HR 63 | Temp 96.4°F | Resp 16 | Ht 65.0 in | Wt 154.0 lb

## 2016-03-28 DIAGNOSIS — Z1211 Encounter for screening for malignant neoplasm of colon: Secondary | ICD-10-CM

## 2016-03-28 DIAGNOSIS — Z1212 Encounter for screening for malignant neoplasm of rectum: Secondary | ICD-10-CM | POA: Diagnosis not present

## 2016-03-28 DIAGNOSIS — D123 Benign neoplasm of transverse colon: Secondary | ICD-10-CM | POA: Diagnosis not present

## 2016-03-28 MED ORDER — SODIUM CHLORIDE 0.9 % IV SOLN: 500.0000 mL | INTRAVENOUS | Status: DC

## 2016-03-28 NOTE — Patient Instructions (Signed)
HANDOUT GIVEN FOR POLYPS  YOU HAD AN ENDOSCOPIC PROCEDURE TODAY AT THE [facility]:   Refer to the procedure report that was given to you for any specific questions about what was found during the examination.  If the procedure report does not answer your questions, please call your gastroenterologist to clarify.  If you requested that your care partner not be given the details of your procedure findings, then the procedure report has been included in a sealed envelope for you to review at your convenience later.  YOU SHOULD EXPECT: Some feelings of bloating in the abdomen. Passage of more gas than usual.  Walking can help get rid of the air that was put into your GI tract during the procedure and reduce the bloating. If you had a lower endoscopy (such as a colonoscopy or flexible sigmoidoscopy) you may notice spotting of blood in your stool or on the toilet paper. If you underwent a bowel prep for your procedure, you may not have a normal bowel movement for a few days.  Please Note:  You might notice some irritation and congestion in your nose or some drainage.  This is from the oxygen used during your procedure.  There is no need for concern and it should clear up in a day or so.  SYMPTOMS TO REPORT IMMEDIATELY:   Following lower endoscopy (colonoscopy or flexible sigmoidoscopy):  Excessive amounts of blood in the stool  Significant tenderness or worsening of abdominal pains  Swelling of the abdomen that is new, acute  Fever of 100F or higher  For urgent or emergent issues, a gastroenterologist can be reached at any hour by calling [phone].   DIET:  We do recommend a small meal at first, but then you may proceed to your regular diet.  Drink plenty of fluids but you should avoid alcoholic beverages for 24 hours.  ACTIVITY:  You should plan to take it easy for the rest of today and you should NOT DRIVE or use heavy machinery until tomorrow (because of the sedation medicines  used during the test).    FOLLOW UP: Our staff will call the number listed on your records the next business day following your procedure to check on you and address any questions or concerns that you may have regarding the information given to you following your procedure. If we do not reach you, we will leave a message.  However, if you are feeling well and you are not experiencing any problems, there is no need to return our call.  We will assume that you have returned to your regular daily activities without incident.  If any biopsies were taken you will be contacted by phone or by letter within the next 1-3 weeks.  Please call us at [phone] if you have not heard about the biopsies in 3 weeks.    SIGNATURES/CONFIDENTIALITY: You and/or your care partner have signed paperwork which will be entered into your electronic medical record.  These signatures attest to the fact that that the information above on your After Visit Summary has been reviewed and is understood.  Full responsibility of the confidentiality of this discharge information lies with you and/or your care-partner. 

## 2016-03-28 NOTE — Progress Notes (Signed)
A/ox3 pleased with MAC, report to Karen RN 

## 2016-03-28 NOTE — Op Note (Signed)
[place] Patient Name: Jessica Thompson Procedure Date: 03/28/2016 3:15 PM MRN: [id number] Endoscopist: Remo Lipps P. Havery [name] , MD Age: 65 Referring MD:  Date of Birth: 06/11/1951 Gender: Female Account #: [id number] Procedure:                Colonoscopy Indications:              Screening for malignant neoplasm in the colon, last                            exam in 2006 normal. Medicines:                Monitored Anesthesia Care Procedure:                Pre-Anesthesia Assessment:                           - Prior to the procedure, a History and Physical                            was performed, and patient medications and                            allergies were reviewed. The patient's tolerance of                            previous anesthesia was also reviewed. The risks                            and benefits of the procedure and the sedation                            options and risks were discussed with the patient.                            All questions were answered, and informed consent                            was obtained. Prior Anticoagulants: The patient has                            taken no previous anticoagulant or antiplatelet                            agents. ASA Grade Assessment: II - A patient with                            mild systemic disease. After reviewing the risks                            and benefits, the patient was deemed in                            satisfactory condition to undergo the procedure.  After obtaining informed consent, the colonoscope                            was passed under direct vision. Throughout the                            procedure, the patient's blood pressure, pulse, and                            oxygen saturations were monitored continuously. The                            Model PCF-H190DL [id number]) scope was introduced                            through the anus and  advanced to the the cecum,                            identified by appendiceal orifice and ileocecal                            valve. The colonoscopy was performed without                            difficulty. The patient tolerated the procedure                            well. The quality of the bowel preparation was                            good. The ileocecal valve, appendiceal orifice, and                            rectum were photographed. Scope In: 3:23:04 PM Scope Out: 3:41:01 PM Scope Withdrawal Time: 0 hours 14 minutes 51 seconds  Total Procedure Duration: 0 hours 17 minutes 57 seconds  Findings:                 The perianal and digital rectal examinations were                            normal.                           A 3 mm polyp was found in the transverse colon. The                            polyp was sessile. The polyp was removed with a                            cold biopsy forceps. Resection and retrieval were                            complete.  The exam was otherwise without abnormality on                            direct and retroflexion views. Complications:            No immediate complications. Estimated blood loss:                            Minimal. Estimated Blood Loss:     Estimated blood loss was minimal. Impression:               - One 3 mm polyp in the transverse colon, removed                            with a cold biopsy forceps. Resected and retrieved.                           - The examination was otherwise normal on direct                            and retroflexion views. Recommendation:           - Patient has a contact number available for                            emergencies. The signs and symptoms of potential                            delayed complications were discussed with the                            patient. Return to normal activities tomorrow.                            Written discharge  instructions were provided to the                            patient.                           - Resume previous diet.                           - Continue present medications.                           - Await pathology results.                           - Repeat colonoscopy is recommended for                            surveillance. The colonoscopy date will be                            determined after pathology results from today's  exam become available for review. Remo Lipps P. Armbruster, MD 03/28/2016 3:43:41 PM This report has been signed electronically.

## 2016-03-28 NOTE — Progress Notes (Signed)
Called to room to assist during endoscopic procedure.  Patient ID and intended procedure confirmed with present staff. Received instructions for my participation in the procedure from the performing physician.  

## 2016-03-31 ENCOUNTER — Telehealth: Payer: Self-pay

## 2016-03-31 NOTE — Telephone Encounter (Signed)
  Follow up Call-  Call back number 03/28/2016  Post procedure Call Back phone  # [phone]  Permission to leave phone message Yes  Some recent data might be hidden    Patient was called for follow up after her procedure on 03/28/2016. No answer at the number given for follow up phone call. A message was left on the answering machine. This was the second attempt to contact the patient.

## 2016-03-31 NOTE — Telephone Encounter (Signed)
  Follow up Call-  Call back number 03/28/2016  Post procedure Call Back phone  # [phone]  Permission to leave phone message Yes  Some recent data might be hidden    Patient reports that she had a lot of trouble with the oxygen irritating her nasal passages after the procedure. Patient also complained that when she was sedated the medication burned her hand when it was administered. The patient seems to be doing fine at this point in time and has returned to her normal daily activities.    Patient questions:  Do you have a fever, pain , or abdominal swelling? No. Pain Score  0 *  Have you tolerated food without any problems? Yes.    Have you been able to return to your normal activities? Yes.    Do you have any questions about your discharge instructions: Diet   No. Medications  No. Follow up visit  No.  Do you have questions or concerns about your Care? No.  Actions: * If pain score is 4 or above: No action needed, pain <4.

## 2016-03-31 NOTE — Telephone Encounter (Signed)
Left message on answering machine. 

## 2016-04-05 ENCOUNTER — Encounter: Payer: Self-pay | Admitting: Gastroenterology

## 2016-04-08 ENCOUNTER — Ambulatory Visit: Payer: Self-pay

## 2016-04-16 ENCOUNTER — Inpatient Hospital Stay: Admission: RE | Admit: 2016-04-16 | Payer: Self-pay | Source: Ambulatory Visit

## 2016-05-06 ENCOUNTER — Encounter: Payer: Medicare Other | Admitting: Internal Medicine

## 2016-05-10 ENCOUNTER — Encounter: Payer: Self-pay | Admitting: Family Medicine

## 2016-05-10 ENCOUNTER — Ambulatory Visit (INDEPENDENT_AMBULATORY_CARE_PROVIDER_SITE_OTHER): Payer: Medicare Other | Admitting: Family Medicine

## 2016-05-10 VITALS — BP 110/70 | HR 69 | Temp 97.9°F | Resp 18 | Ht 63.58 in | Wt 160.8 lb

## 2016-05-10 DIAGNOSIS — J069 Acute upper respiratory infection, unspecified: Secondary | ICD-10-CM | POA: Diagnosis not present

## 2016-05-10 DIAGNOSIS — R062 Wheezing: Secondary | ICD-10-CM

## 2016-05-10 MED ORDER — IPRATROPIUM-ALBUTEROL 0.5-2.5 (3) MG/3ML IN SOLN
3.0000 mL | Freq: Once | RESPIRATORY_TRACT | Status: AC
  Administered 2016-05-10: 3 mL via RESPIRATORY_TRACT

## 2016-05-10 MED ORDER — BUDESONIDE 180 MCG/ACT IN AEPB: 1.0000 | INHALATION_SPRAY | Freq: Four times a day (QID) | RESPIRATORY_TRACT | 4 refills | Status: DC | PRN

## 2016-05-10 MED ORDER — AZITHROMYCIN 250 MG PO TABS: ORAL_TABLET | ORAL | 0 refills | Status: DC

## 2016-05-10 NOTE — Assessment & Plan Note (Signed)
Pt's sxs and PE consistent w/ URI.  Given her hx of asthma- will start abx.  She reports she has taken Zpack in the past w/o difficulty.  Duoneb tx given in office.  Refill provided on Pulmicort inhaler at pt's request.  Reviewed supportive care and red flags that should prompt return.  Pt expressed understanding and is in agreement w/ plan.

## 2016-05-10 NOTE — Patient Instructions (Signed)
Follow up as needed Start the Zpack as directed Drink plenty of fluids Continue the Pulmicort and Albuterol inhalers Mucinex DM for cough and congestion REST! Call with any questions or concerns Hang in there! Happy Holidays!

## 2016-05-10 NOTE — Progress Notes (Signed)
Pre-visit discussion using our clinic review tool. No additional management support is needed unless otherwise documented below in the visit note.  

## 2016-05-10 NOTE — Progress Notes (Signed)
   Subjective:    Patient ID: Jessica Thompson, female    DOB: September 25, 1950, 65 y.o.   MRN: [id number]  HPI URI- pt reports hoarseness and chest congestion.  + cough- productive of thick yellow sputum x2-3 days.  No fevers.  No sinus pain/pressure.  No ear pain/pressure.  + sore throat.  + sick contacts.  No N/V.  + SOB w/ some wheezing.  + hx of asthma.  Using albuterol more regularly   Review of Systems For ROS see HPI     Objective:   Physical Exam  Constitutional: She is oriented to person, place, and time. She appears well-developed and well-nourished. No distress.  HENT:  Head: Normocephalic and atraumatic.  TMs normal bilaterally Mild nasal congestion  No TTP over frontal or maxillary sinuses Throat w/out erythema, edema, or exudate  Eyes: Conjunctivae and EOM are normal. Pupils are equal, round, and reactive to light.  Neck: Normal range of motion. Neck supple.  Cardiovascular: Normal rate, regular rhythm, normal heart sounds and intact distal pulses.   No murmur heard. Pulmonary/Chest: Effort normal. No respiratory distress. She has no wheezes.  + cough Coarse BS on L, CTA on R  Lymphadenopathy:    She has no cervical adenopathy.  Neurological: She is alert and oriented to person, place, and time.  Skin: Skin is warm and dry.  Psychiatric: She has a normal mood and affect. Her behavior is normal. Thought content normal.          Assessment & Plan:

## 2016-05-22 IMAGING — DX DG CHEST 2V
2 series · 2 of 2 positions shown · non-contrast
Comparison: 07/01/2012.

CLINICAL DATA: Positive TB test.

EXAM:
CHEST  2 VIEW

[chest pa]
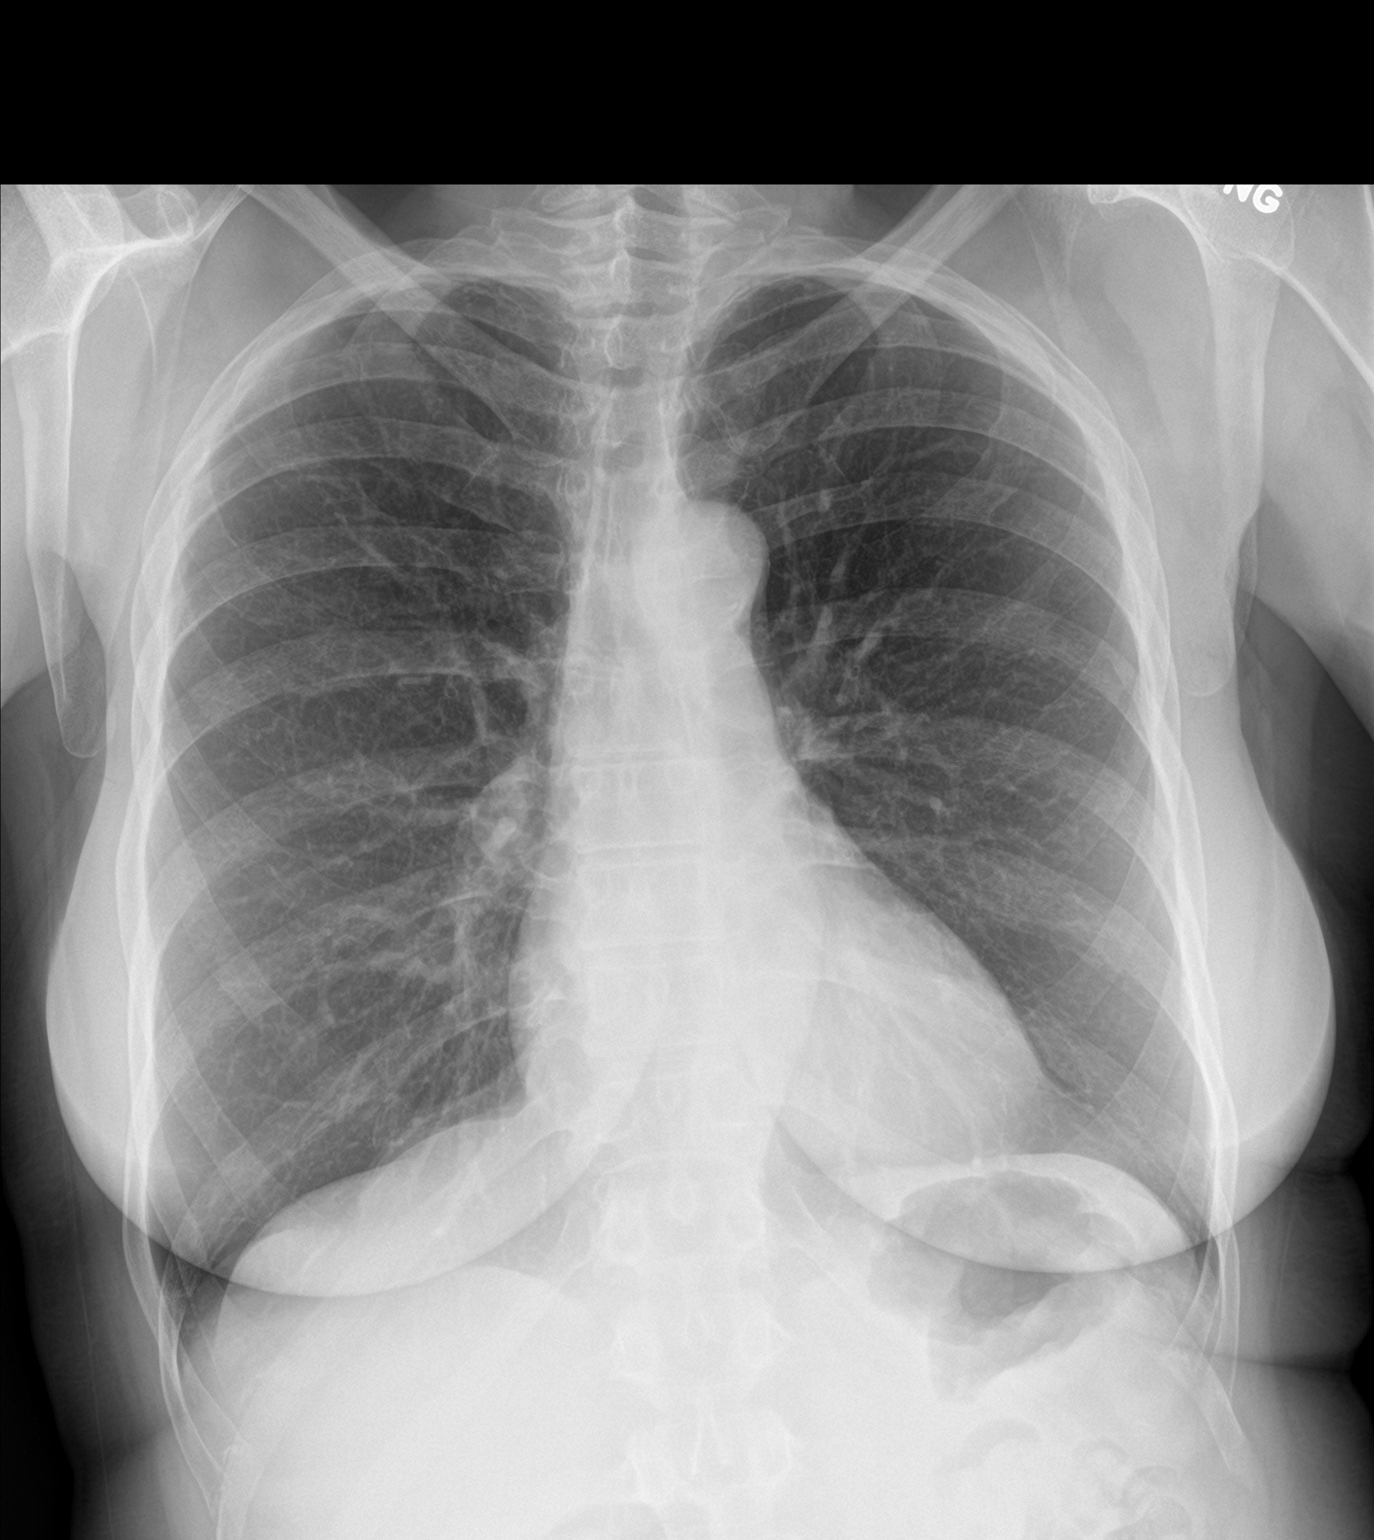

[chest lat]
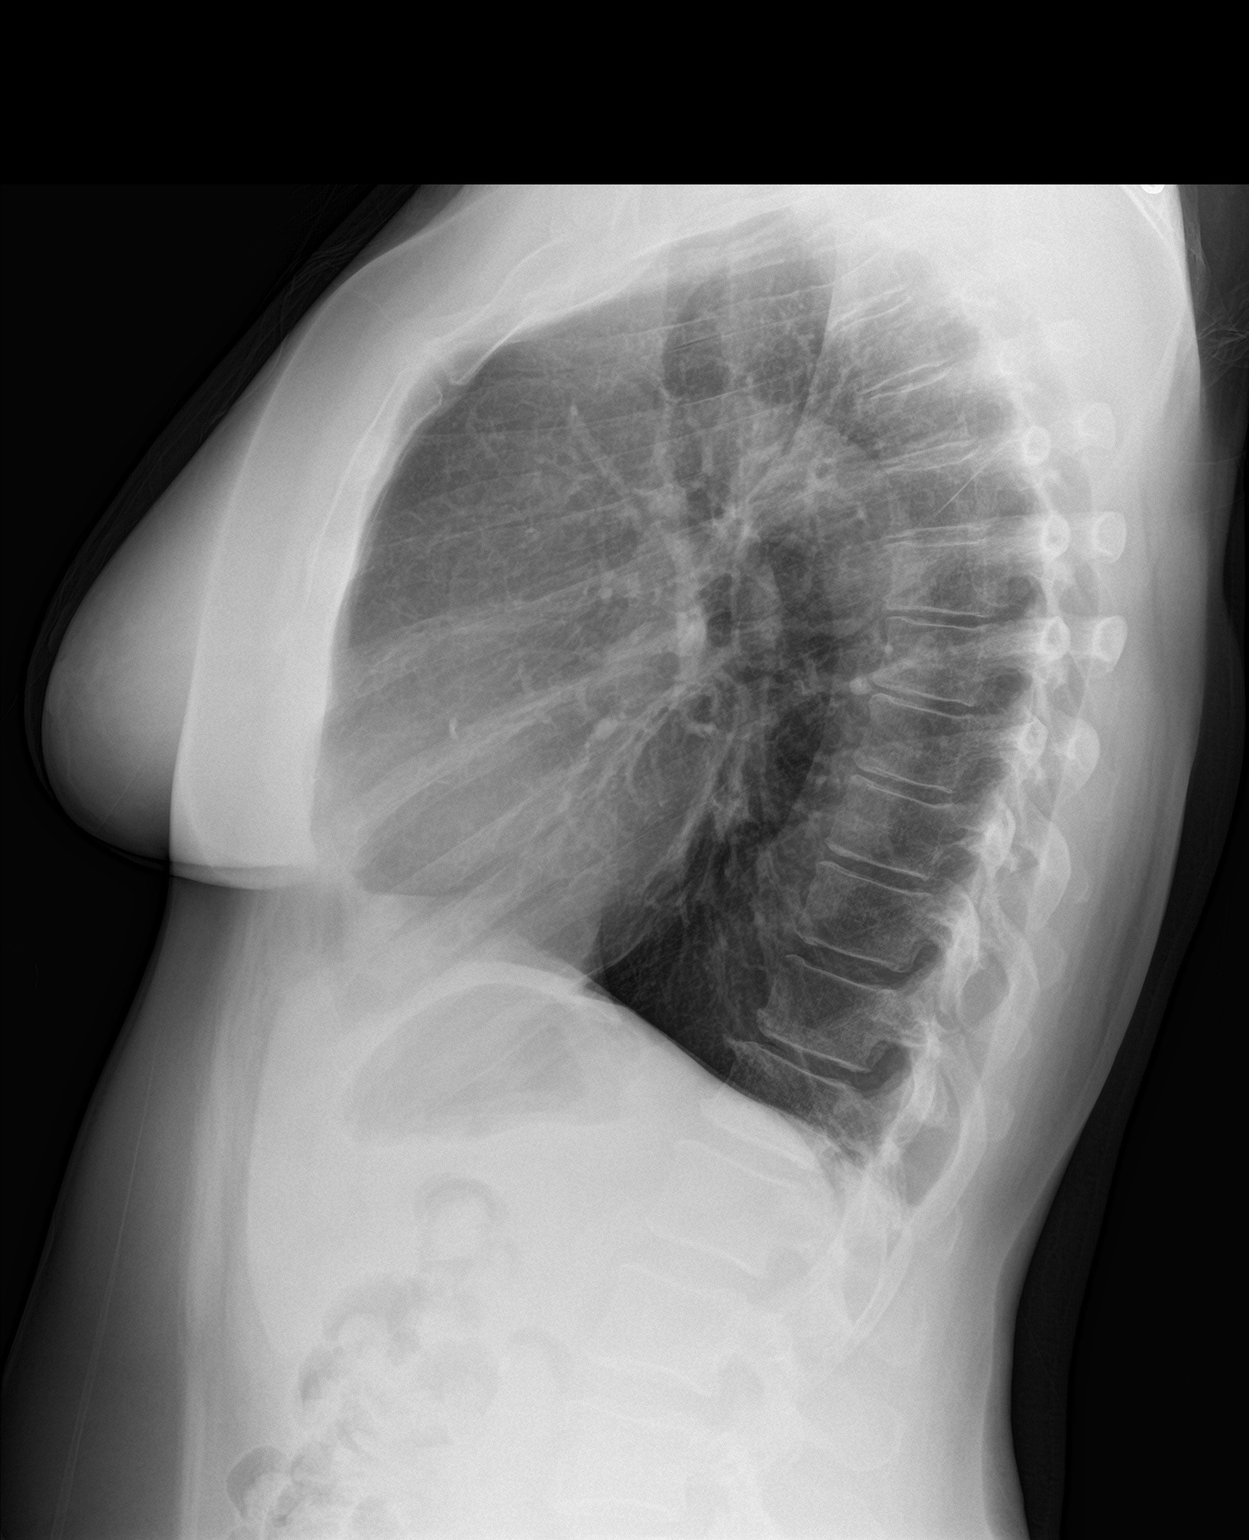

[2 of 2 positions shown; findings below may reference images not displayed]

FINDINGS: Mediastinum and hilar structures normal. Lungs are clear. Heart size
normal. No pleural effusion or pneumothorax. Chest is stable from
prior study of 07/01/2012 .
IMPRESSION: No active cardiopulmonary disease.

## 2016-06-09 ENCOUNTER — Other Ambulatory Visit: Payer: Self-pay | Admitting: Internal Medicine

## 2016-06-09 DIAGNOSIS — E2839 Other primary ovarian failure: Secondary | ICD-10-CM

## 2016-07-04 ENCOUNTER — Ambulatory Visit
Admission: RE | Admit: 2016-07-04 | Discharge: 2016-07-04 | Disposition: A | Discharge status: Home / Self Care | Payer: Medicare Other | Source: Ambulatory Visit | Attending: Internal Medicine | Admitting: Internal Medicine

## 2016-07-04 DIAGNOSIS — E2839 Other primary ovarian failure: Secondary | ICD-10-CM

## 2016-07-04 DIAGNOSIS — Z1231 Encounter for screening mammogram for malignant neoplasm of breast: Secondary | ICD-10-CM

## 2016-07-26 ENCOUNTER — Ambulatory Visit (INDEPENDENT_AMBULATORY_CARE_PROVIDER_SITE_OTHER): Payer: Medicare Other | Admitting: Family Medicine

## 2016-07-26 ENCOUNTER — Encounter: Payer: Self-pay | Admitting: Family Medicine

## 2016-07-26 VITALS — BP 130/76 | HR 80 | Temp 98.4°F | Resp 20 | Wt 156.0 lb

## 2016-07-26 DIAGNOSIS — J45909 Unspecified asthma, uncomplicated: Secondary | ICD-10-CM

## 2016-07-26 DIAGNOSIS — J209 Acute bronchitis, unspecified: Secondary | ICD-10-CM | POA: Diagnosis not present

## 2016-07-26 MED ORDER — AZITHROMYCIN 250 MG PO TABS: ORAL_TABLET | ORAL | 0 refills | Status: DC

## 2016-07-26 MED ORDER — GUAIFENESIN-CODEINE 100-10 MG/5ML PO SYRP: 5.0000 mL | ORAL_SOLUTION | Freq: Every evening | ORAL | 0 refills | Status: DC | PRN

## 2016-07-26 NOTE — Progress Notes (Addendum)
BP 130/76   Pulse 80   Temp 98.4 F (36.9 C) (Oral)   Resp 20   Wt 156 lb (70.8 kg)   SpO2 95%   BMI 27.13 kg/m    CC: chest congestion Subjective:    Patient ID: Jessica Thompson, female    DOB: 02-08-1951, 66 y.o.   MRN: [id number]  HPI: Jessica Thompson is a 66 y.o. female presenting on 07/26/2016 for Nasal Congestion and Cough   Several day h/o chest congestion with cough, over last 2 days noticing darker mucous. + wheezing and some exertional dyspnea.   No fevers/chills, ear or tooth pain, PNDrainge, ST. No nasal congestion. No body aches.   Known asthma and allergies.  Taking claritin, mucinex and taking her inhalers (pulmicort, nasacort, ventolin prn).  + sick contacts at home - flu and bronchitis.   Relevant past medical, surgical, family and social history reviewed and updated as indicated. Interim medical history since our last visit reviewed. Allergies and medications reviewed and updated. Current Outpatient Prescriptions on File Prior to Visit  Medication Sig  . budesonide (PULMICORT) 180 MCG/ACT inhaler Inhale 1 puff into the lungs every 6 (six) hours as needed (wheezing).  . Calcium-Phosphorus-Vitamin D (CITRACAL +D3 PO) Take by mouth daily.  . Calcium-Vitamin D (CALTRATE 600 PLUS-VIT D PO) Take 1 tablet by mouth daily. Reported on 06/26/2015  . Fluticasone Furoate (ARNUITY ELLIPTA) 100 MCG/ACT AEPB Inhale 1 puff into the lungs daily.  Marland Kitchen guaiFENesin (MUCINEX) 600 MG 12 hr tablet Take 600 mg by mouth 2 (two) times daily. Reported on 06/26/2015  . loratadine (CLARITIN) 10 MG tablet Take 10 mg by mouth 2 (two) times daily.  . Multiple Vitamins-Minerals (CENTRUM SILVER ADULT 50+ PO) Take 1 tablet by mouth daily.  . Omega-3 Fatty Acids (OMEGA 3 PO) Take by mouth daily.  Marland Kitchen triamcinolone (NASACORT) 55 MCG/ACT AERO nasal inhaler Place 2 sprays into the nose daily. Reported on 06/26/2015  . VENTOLIN HFA 108 (90 Base) MCG/ACT inhaler INHALE 2 PUFFS BY MOUTH EVERY 6 HOURS  AS NEEDED FOR WHEEZING OR SHORTNESS OF BREATH   Current Facility-Administered Medications on File Prior to Visit  Medication  . 0.9 %  sodium chloride infusion    Review of Systems Per HPI unless specifically indicated in ROS section     Objective:    BP 130/76   Pulse 80   Temp 98.4 F (36.9 C) (Oral)   Resp 20   Wt 156 lb (70.8 kg)   SpO2 95%   BMI 27.13 kg/m   Wt Readings from Last 3 Encounters:  07/26/16 156 lb (70.8 kg)  05/10/16 160 lb 12 oz (72.9 kg)  03/28/16 154 lb (69.9 kg)    Physical Exam  Constitutional: She appears well-developed and well-nourished. No distress.  HENT:  Head: Normocephalic and atraumatic.  Right Ear: Hearing, tympanic membrane, external ear and ear canal normal.  Left Ear: Hearing, tympanic membrane, external ear and ear canal normal.  Nose: Mucosal edema (nasal mucosal congestion) present. No rhinorrhea. Right sinus exhibits no maxillary sinus tenderness and no frontal sinus tenderness. Left sinus exhibits no maxillary sinus tenderness and no frontal sinus tenderness.  Mouth/Throat: Uvula is midline, oropharynx is clear and moist and mucous membranes are normal. No oropharyngeal exudate, posterior oropharyngeal edema, posterior oropharyngeal erythema or tonsillar abscesses.  Eyes: Conjunctivae and EOM are normal. Pupils are equal, round, and reactive to light. No scleral icterus.  Neck: Normal range of motion. Neck supple.  Cardiovascular: Normal rate,  regular rhythm, normal heart sounds and intact distal pulses.   No murmur heard. Pulmonary/Chest: Effort normal and breath sounds normal. No respiratory distress. She has no wheezes. She has no rales.  Lungs clear  Lymphadenopathy:    She has no cervical adenopathy.  Skin: Skin is warm and dry. No rash noted.  Nursing note and vitals reviewed.     Assessment & Plan:   Problem List Items Addressed This Visit    Acute bronchitis with asthma - Primary    Anticipate viral given short  duration. Discussed anticipated course of improvement with patient. Continue supportive care, codeine cough syrup for night time. If worsening productive cough, fever, or not improving as expected, low threshold to start zpack provided today.  Flu swab today given exposures at home - negative.           Follow up plan: Return if symptoms worsen or fail to improve.  Ria Bush, MD

## 2016-07-26 NOTE — Assessment & Plan Note (Addendum)
Anticipate viral given short duration. Discussed anticipated course of improvement with patient. Continue supportive care, codeine cough syrup for night time. If worsening productive cough, fever, or not improving as expected, low threshold to start zpack provided today.  Flu swab today given exposures at home - negative.

## 2016-07-26 NOTE — Patient Instructions (Addendum)
I think you have beginnings of bronchitis developing. These are usually viral and should improve over the next week.  If fever >101, worsening productive cough or not improving as expected, fill zpack antibiotic provided today.  Continue other medicines as up to now.  Codeine codeine cough syrup for night time cough.

## 2016-07-26 NOTE — Progress Notes (Signed)
Pre visit review using our clinic review tool, if applicable. No additional management support is needed unless otherwise documented below in the visit note. 

## 2016-07-28 LAB — POCT INFLUENZA A/B
Influenza A, POC: NEGATIVE
Influenza B, POC: NEGATIVE

## 2016-07-28 NOTE — Addendum Note (Signed)
Addended by: Della Goo C on: 07/28/2016 08:20 AM   Modules accepted: Orders

## 2016-11-04 ENCOUNTER — Ambulatory Visit (INDEPENDENT_AMBULATORY_CARE_PROVIDER_SITE_OTHER): Payer: Medicare Other | Admitting: Ophthalmology

## 2017-04-17 ENCOUNTER — Ambulatory Visit (HOSPITAL_COMMUNITY)
Admission: EM | Admit: 2017-04-17 | Discharge: 2017-04-17 | Disposition: A | Discharge status: Home / Self Care | Payer: Medicare Other | Attending: Emergency Medicine | Admitting: Emergency Medicine

## 2017-04-17 ENCOUNTER — Encounter (HOSPITAL_COMMUNITY): Payer: Self-pay | Admitting: Emergency Medicine

## 2017-04-17 DIAGNOSIS — J4541 Moderate persistent asthma with (acute) exacerbation: Secondary | ICD-10-CM

## 2017-04-17 DIAGNOSIS — R062 Wheezing: Secondary | ICD-10-CM

## 2017-04-17 DIAGNOSIS — R05 Cough: Secondary | ICD-10-CM

## 2017-04-17 DIAGNOSIS — R0602 Shortness of breath: Secondary | ICD-10-CM

## 2017-04-17 MED ORDER — IPRATROPIUM-ALBUTEROL 0.5-2.5 (3) MG/3ML IN SOLN
3.0000 mL | Freq: Once | RESPIRATORY_TRACT | Status: AC
  Administered 2017-04-17: 3 mL via RESPIRATORY_TRACT

## 2017-04-17 MED ORDER — PREDNISONE 10 MG PO TABS: 20.0000 mg | ORAL_TABLET | Freq: Every day | ORAL | 0 refills | Status: DC

## 2017-04-17 MED ORDER — METHYLPREDNISOLONE SODIUM SUCC 125 MG IJ SOLR
INTRAMUSCULAR | Status: AC
Start: 2017-04-17 — End: 2017-04-17
  Filled 2017-04-17: qty 2

## 2017-04-17 MED ORDER — IPRATROPIUM-ALBUTEROL 0.5-2.5 (3) MG/3ML IN SOLN
RESPIRATORY_TRACT | Status: AC
  Filled 2017-04-17: qty 3

## 2017-04-17 MED ORDER — METHYLPREDNISOLONE SODIUM SUCC 125 MG IJ SOLR
80.0000 mg | Freq: Once | INTRAMUSCULAR | Status: AC
  Administered 2017-04-17: 80 mg via INTRAMUSCULAR

## 2017-04-17 NOTE — ED Triage Notes (Signed)
Pt here for asthma flare up onset last night associated w/SOB, wheezing, prod cough, nasal congestion/drainage  Pt is speaking in complete sentences  A&O x4... NAD... Ambulatory

## 2017-04-17 NOTE — ED Provider Notes (Signed)
[facility]    CSN: [id number] Arrival date & time: 04/17/17  1140     History   Chief Complaint Chief Complaint  Patient presents with  . Asthma    HPI Jessica Thompson is a 66 y.o. female.   Jessica Thompson presents with complaints of wheezing, cough and shortness of breath which has worsened since last night. She has had a mild cough which she has been taking mucinex for but has minimally helped. She uses a ventolin inhaler which helps some, but inadequate at this time. She takes daily pulmicort as well for asthma. She states she feels similar to other asthma flairs. Without fevers, gi/gu complaints, ear or throat pain. Her cough is productive. Denies any pain. She does not smoke. Without history of hypertension, she states she is always "borderline." she does have a PCP.   ROS per HPI.       Past Medical History:  Diagnosis Date  . Allergy   . Anemia    Betathalasemia minor  . Asthma   . Sinusitis     Patient Active Problem List   Diagnosis Date Noted  . Acute bronchitis with asthma 07/26/2016  . Nasal polyposis 06/26/2015  . Mild intermittent asthma 06/26/2015  . Routine general medical examination at a health care facility 05/02/2015  . Elevated blood pressure (not hypertension) 10/22/2014  . Allergic rhinitis 10/22/2014  . Thalassemia trait 10/22/2014    Past Surgical History:  Procedure Laterality Date  . POLYPECTOMY    . sinus sugery      OB History    No data available       Home Medications    Prior to Admission medications   Medication Sig Start Date End Date Taking? Authorizing Provider  budesonide (PULMICORT) 180 MCG/ACT inhaler Inhale 1 puff into the lungs every 6 (six) hours as needed (wheezing). 05/10/16  Yes Midge Minium, MD  Calcium-Phosphorus-Vitamin D (CITRACAL +D3 PO) Take by mouth daily.   Yes [provider]  Calcium-Vitamin D (CALTRATE 600 PLUS-VIT D PO) Take 1 tablet by mouth daily. Reported on 06/26/2015    Yes [provider]  Fluticasone Furoate (ARNUITY ELLIPTA) 100 MCG/ACT AEPB Inhale 1 puff into the lungs daily. 10/15/15  Yes Hoyt Koch, MD  loratadine (CLARITIN) 10 MG tablet Take 10 mg by mouth 2 (two) times daily.   Yes [provider]  Multiple Vitamins-Minerals (CENTRUM SILVER ADULT 50+ PO) Take 1 tablet by mouth daily.   Yes [provider]  Omega-3 Fatty Acids (OMEGA 3 PO) Take by mouth daily.   Yes [provider]  triamcinolone (NASACORT) 55 MCG/ACT AERO nasal inhaler Place 2 sprays into the nose daily. Reported on 06/26/2015   Yes [provider]  VENTOLIN HFA 108 (90 Base) MCG/ACT inhaler INHALE 2 PUFFS BY MOUTH EVERY 6 HOURS AS NEEDED FOR WHEEZING OR SHORTNESS OF BREATH 11/23/15  Yes Hoyt Koch, MD  azithromycin [facility]) 250 MG tablet Take two tablets on day one followed by one tablet on days 2-5 07/26/16   Ria Bush, MD  guaiFENesin (MUCINEX) 600 MG 12 hr tablet Take 600 mg by mouth 2 (two) times daily. Reported on 06/26/2015    [provider]  guaiFENesin-codeine (ROBITUSSIN AC) 100-10 MG/5ML syrup Take 5-10 mLs by mouth at bedtime as needed for cough. 07/26/16   Ria Bush, MD  predniSONE (DELTASONE) 10 MG tablet Take 2 tablets (20 mg total) by mouth daily. Take 5 tabs x 2 days, 4 tabs x  2 days, 2 tabs for 2 days 04/17/17   Zigmund Gottron, NP    Family History Family History  Problem Relation Age of Onset  . Hyperlipidemia Mother   . Anemia Mother   . Hypertension Father   . Emphysema Father   . Heart disease Brother   . Liver disease Brother     Social History Social History  Substance Use Topics  . Smoking status: Never Smoker  . Smokeless tobacco: Never Used  . Alcohol use No     Allergies   Cabbage; Cheese; Corn-containing products; Erythromycin; Peanut-containing drug products; Soy allergy; Wheat bran; and Mushroom extract complex   Review of Systems Review of  Systems   Physical Exam Triage Vital Signs ED Triage Vitals [04/17/17 1150]  Enc Vitals Group     BP (!) 167/91     Pulse Rate 98     Resp (!) 24     Temp 98 F (36.7 C)     Temp Source Oral     SpO2 98 %     Weight      Height      Head Circumference      Peak Flow      Pain Score      Pain Loc      Pain Edu?      Excl. in [place]?    No data found.   Updated Vital Signs BP (!) 167/91 (BP Location: Right Arm)   Pulse 98   Temp 98 F (36.7 C) (Oral)   Resp (!) 24   SpO2 98%   Visual Acuity Right Eye Distance:   Left Eye Distance:   Bilateral Distance:    Right Eye Near:   Left Eye Near:    Bilateral Near:     Physical Exam  Constitutional: She is oriented to person, place, and time. She appears well-developed and well-nourished. No distress.  HENT:  Right Ear: External ear normal.  Left Ear: External ear normal.  Mouth/Throat: Oropharynx is clear and moist.  Eyes: Pupils are equal, round, and reactive to light. Conjunctivae are normal.  Cardiovascular: Normal rate, regular rhythm and normal heart sounds.   Pulmonary/Chest: Effort normal. No accessory muscle usage. Tachypnea noted. No respiratory distress. She has wheezes in the right upper field, the right lower field, the left upper field and the left lower field. She exhibits no tenderness.  Inspiratory and expiratory wheezes throughout on initial exam. Patient non distressed at this time without significant work of breathing, tachypnea noted  Musculoskeletal: Normal range of motion.  Neurological: She is alert and oriented to person, place, and time.  Skin: Skin is warm and dry. She is not diaphoretic.  Vitals reviewed.    UC Treatments / Results  Labs (all labs ordered are listed, but only abnormal results are displayed) Labs Reviewed - No data to display  EKG  EKG Interpretation None       Radiology No results found.  Procedures Procedures (including critical care time)  Medications  Ordered in UC Medications  methylPREDNISolone sodium succinate (SOLU-MEDROL) 125 mg/2 mL injection 80 mg (80 mg Intramuscular Given 04/17/17 1237)  ipratropium-albuterol (DUONEB) 0.5-2.5 (3) MG/3ML nebulizer solution 3 mL (3 mLs Nebulization Given 04/17/17 1238)     Initial Impression / Assessment and Plan / UC Course  I have reviewed the triage vital signs and the nursing notes.  Pertinent labs & imaging results that were available during my care of the patient were reviewed by me and considered  in my medical decision making (see chart for details).  Clinical Course as of Apr 17 1321  Fri Apr 17, 2017  1254 Patient verbalizes she already feels significantly improved s/p neb. Wheezing primarily with expiration only at this time, improvement noted on auscultation   [NB]  1319 O2 98%, Hr 86. Lungs with minimal residual wheezing. RR 20  [NB]    Clinical Course User Index [NB] Augusto Gamble B, NP   Significant improvement in lung sounds and RR s/p solumedrol and duoneb. Patient endorses she feels much improved. Course of steroids provided. Continue with use of rescue inhaler as needed. Push fluids. Discussed signs to return. If symptoms worsen or do not improve in the next week to return to be seen or to follow up with PCP. Patient verbalized understanding and agreeable to plan.  Ambulatory out of clinic without difficulty.   Final Clinical Impressions(s) / UC Diagnoses   Final diagnoses:  Moderate persistent asthma with exacerbation    New Prescriptions New Prescriptions   PREDNISONE (DELTASONE) 10 MG TABLET    Take 2 tablets (20 mg total) by mouth daily. Take 5 tabs x 2 days, 4 tabs x 2 days, 2 tabs for 2 days     Controlled Substance Prescriptions [place] Controlled Substance Registry consulted? Not Applicable   Zigmund Gottron, NP 04/17/17 1323

## 2017-04-27 ENCOUNTER — Encounter (HOSPITAL_COMMUNITY): Payer: Self-pay | Admitting: Emergency Medicine

## 2017-04-27 ENCOUNTER — Ambulatory Visit (HOSPITAL_COMMUNITY)
Admission: EM | Admit: 2017-04-27 | Discharge: 2017-04-27 | Disposition: A | Discharge status: Home / Self Care | Payer: Medicare Other | Attending: Family Medicine | Admitting: Family Medicine

## 2017-04-27 DIAGNOSIS — R059 Cough, unspecified: Secondary | ICD-10-CM

## 2017-04-27 DIAGNOSIS — R062 Wheezing: Secondary | ICD-10-CM

## 2017-04-27 DIAGNOSIS — R05 Cough: Secondary | ICD-10-CM

## 2017-04-27 MED ORDER — AZITHROMYCIN 250 MG PO TABS: ORAL_TABLET | ORAL | 0 refills | Status: DC

## 2017-04-27 MED ORDER — PREDNISONE 10 MG (48) PO TBPK: ORAL_TABLET | ORAL | 0 refills | Status: DC

## 2017-04-27 NOTE — ED Triage Notes (Signed)
Pt here 1 week ago for wheezing, given steroids and breathing treatment, felt better but then this morning woke up and felt the asthma flair up again today. Pt speaking in complete sentences.

## 2017-04-27 NOTE — ED Provider Notes (Signed)
[place]   [id number] 04/27/17 Arrival Time: 3220  ASSESSMENT & PLAN:  1. Wheezing   2. Cough     Meds ordered this encounter  Medications  . azithromycin (ZITHROMAX) 250 MG tablet    Sig: Take two tablets on day one followed by one tablet on days 2-5    Dispense:  6 each    Refill:  0  . predniSONE (STERAPRED UNI-PAK 48 TAB) 10 MG (48) TBPK tablet    Sig: Take as directed.    Dispense:  48 tablet    Refill:  0   May f/u as needed. Recommend PCP f/u if not improving over the next few days. OTC symptom care as needed.  Reviewed expectations re: course of current medical issues. Questions answered. Outlined signs and symptoms indicating need for more acute intervention. Patient verbalized understanding. After Visit Summary given.   SUBJECTIVE:  Jessica Thompson is a 66 y.o. female who presents with complaint of nasal congestion, post-nasal drainage, and a persistent cough. Onset abrupt approximately 1 week ago. Temporary relief with prednisone prescribed last week. Symptoms have returned. Overall fatigued. SOB: none. Wheezing: moderate. OTC treatment: None. Cough interfering with sleep. No n/v.  ROS: As per HPI.   OBJECTIVE:  Vitals:   04/27/17 1021  BP: (!) 160/81  Pulse: 69  Resp: 20  Temp: (!) 97.1 F (36.2 C)  SpO2: 100%     General appearance: alert; no distress HEENT: nasal congestion; clear runny nose; throat irritation secondary to post-nasal drainage Neck: supple without LAD Lungs: moving air fairly well; mild wheezing bilaterally; no resp distress Skin: warm and dry Psychological: alert and cooperative; normal mood and affect    Allergies  Allergen Reactions  . Cabbage Other (See Comments)    congestion  . Cheese Other (See Comments)    congestion  . Corn-Containing Products Other (See Comments)    congestion  . Erythromycin Other (See Comments)    Abdominal pain  . Peanut-Containing Drug Products Other (See Comments)   congestion  . Soy Allergy Other (See Comments)    congestion  . Wheat Bran Other (See Comments)    congestion  . Mushroom Extract Complex Hives, Rash and Other (See Comments)    Congestion     Past Medical History:  Diagnosis Date  . Allergy   . Anemia    Betathalasemia minor  . Asthma   . Sinusitis    Social History   Socioeconomic History  . Marital status: Married    Spouse name: Not on file  . Number of children: Not on file  . Years of education: Not on file  . Highest education level: Not on file  Social Needs  . Financial resource strain: Not on file  . Food insecurity - worry: Not on file  . Food insecurity - inability: Not on file  . Transportation needs - medical: Not on file  . Transportation needs - non-medical: Not on file  Occupational History  . Not on file  Tobacco Use  . Smoking status: Never Smoker  . Smokeless tobacco: Never Used  Substance and Sexual Activity  . Alcohol use: No  . Drug use: No  . Sexual activity: Not on file  Other Topics Concern  . Not on file  Social History Narrative  . Not on file   Family History  Problem Relation Age of Onset  . Hyperlipidemia Mother   . Anemia Mother   . Hypertension Father   . Emphysema Father   .  Heart disease Brother   . Liver disease Brother            Vanessa Kick, MD 04/27/17 1038

## 2017-06-24 ENCOUNTER — Other Ambulatory Visit: Payer: Self-pay | Admitting: Internal Medicine

## 2017-06-24 DIAGNOSIS — Z1231 Encounter for screening mammogram for malignant neoplasm of breast: Secondary | ICD-10-CM

## 2017-07-15 ENCOUNTER — Ambulatory Visit: Payer: Self-pay

## 2017-08-27 DIAGNOSIS — R69 Illness, unspecified: Secondary | ICD-10-CM | POA: Diagnosis not present

## 2017-09-08 DIAGNOSIS — Z882 Allergy status to sulfonamides status: Secondary | ICD-10-CM | POA: Diagnosis not present

## 2017-09-08 DIAGNOSIS — Z8249 Family history of ischemic heart disease and other diseases of the circulatory system: Secondary | ICD-10-CM | POA: Diagnosis not present

## 2017-09-08 DIAGNOSIS — Z7951 Long term (current) use of inhaled steroids: Secondary | ICD-10-CM | POA: Diagnosis not present

## 2017-09-08 DIAGNOSIS — J45909 Unspecified asthma, uncomplicated: Secondary | ICD-10-CM | POA: Diagnosis not present

## 2018-02-25 ENCOUNTER — Ambulatory Visit (INDEPENDENT_AMBULATORY_CARE_PROVIDER_SITE_OTHER)
Admission: RE | Admit: 2018-02-25 | Discharge: 2018-02-25 | Disposition: A | Discharge status: Home / Self Care | Payer: Medicare HMO | Source: Ambulatory Visit | Attending: Internal Medicine | Admitting: Internal Medicine

## 2018-02-25 ENCOUNTER — Other Ambulatory Visit (INDEPENDENT_AMBULATORY_CARE_PROVIDER_SITE_OTHER): Payer: Medicare HMO

## 2018-02-25 ENCOUNTER — Encounter: Payer: Self-pay | Admitting: Internal Medicine

## 2018-02-25 ENCOUNTER — Ambulatory Visit (INDEPENDENT_AMBULATORY_CARE_PROVIDER_SITE_OTHER): Payer: Medicare HMO | Admitting: Internal Medicine

## 2018-02-25 VITALS — BP 132/80 | HR 72 | Temp 98.2°F | Ht 63.58 in | Wt 158.0 lb

## 2018-02-25 DIAGNOSIS — Z0000 Encounter for general adult medical examination without abnormal findings: Secondary | ICD-10-CM

## 2018-02-25 DIAGNOSIS — Z8611 Personal history of tuberculosis: Secondary | ICD-10-CM | POA: Diagnosis not present

## 2018-02-25 DIAGNOSIS — J4520 Mild intermittent asthma, uncomplicated: Secondary | ICD-10-CM | POA: Diagnosis not present

## 2018-02-25 LAB — CBC
HCT: 30.6 % — ABNORMAL LOW (ref 36.0–46.0)
Hemoglobin: 9.7 g/dL — ABNORMAL LOW (ref 12.0–15.0)
MCHC: 31.8 g/dL (ref 30.0–36.0)
MCV: 70.1 fl — ABNORMAL LOW (ref 78.0–100.0)
Platelets: 277 10*3/uL (ref 150.0–400.0)
RBC: 4.37 Mil/uL (ref 3.87–5.11)
RDW: 16.1 % — ABNORMAL HIGH (ref 11.5–15.5)
WBC: 6.7 10*3/uL (ref 4.0–10.5)

## 2018-02-25 LAB — LIPID PANEL
Cholesterol: 195 mg/dL (ref 0–200)
HDL: 60.3 mg/dL (ref >39.00)
LDL Cholesterol: 119 mg/dL — ABNORMAL HIGH (ref 0–99)
NonHDL: 135.04
Total CHOL/HDL Ratio: 3
Triglycerides: 79 mg/dL (ref 0.0–149.0)
VLDL: 15.8 mg/dL (ref 0.0–40.0)

## 2018-02-25 LAB — COMPREHENSIVE METABOLIC PANEL
ALT: 11 U/L (ref 0–35)
AST: 16 U/L (ref 0–37)
Albumin: 4.1 g/dL (ref 3.5–5.2)
Alkaline Phosphatase: 50 U/L (ref 39–117)
BUN: 16 mg/dL (ref 6–23)
CO2: 31 mEq/L (ref 19–32)
Calcium: 9.4 mg/dL (ref 8.4–10.5)
Chloride: 103 mEq/L (ref 96–112)
Creatinine, Ser: 0.94 mg/dL (ref 0.40–1.20)
GFR: 75.81 mL/min (ref >60.00)
Glucose, Bld: 87 mg/dL (ref 70–99)
Potassium: 3.7 mEq/L (ref 3.5–5.1)
Sodium: 139 mEq/L (ref 135–145)
Total Bilirubin: 0.3 mg/dL (ref 0.2–1.2)
Total Protein: 7.4 g/dL (ref 6.0–8.3)

## 2018-02-25 MED ORDER — BUDESONIDE 180 MCG/ACT IN AEPB: 1.0000 | INHALATION_SPRAY | Freq: Four times a day (QID) | RESPIRATORY_TRACT | 4 refills | Status: DC | PRN

## 2018-02-25 MED ORDER — ALBUTEROL SULFATE HFA 108 (90 BASE) MCG/ACT IN AERS: INHALATION_SPRAY | RESPIRATORY_TRACT | 0 refills | Status: DC

## 2018-02-25 MED ORDER — ZOSTER VAC RECOMB ADJUVANTED 50 MCG/0.5ML IM SUSR
0.5000 mL | Freq: Once | INTRAMUSCULAR | 1 refills | Status: AC
Start: 2018-02-25 — End: 2018-02-25

## 2018-02-25 NOTE — Progress Notes (Signed)
   Subjective:    Patient ID: Jessica Thompson, female    DOB: 09/26/1950, 67 y.o.   MRN: [id number]  HPI Here for medicare wellness and physical, no new complaints. Please see A/P for status and treatment of chronic medical problems.   Diet: heart healthy  Physical activity: sedentary Depression/mood screen: negative Hearing: intact to whispered voice Visual acuity: grossly normal with lens, performs annual eye exam  ADLs: capable Fall risk: none Home safety: good Cognitive evaluation: intact to orientation, naming, recall and repetition EOL planning: adv directives discussed  I have personally reviewed and have noted 1. The patient's medical and social history - reviewed today no changes 2. Their use of alcohol, tobacco or illicit drugs 3. Their current medications and supplements 4. The patient's functional ability including ADL's, fall risks, home safety risks and hearing or visual impairment. 5. Diet and physical activities 6. Evidence for depression or mood disorders 7. Care team reviewed and updated (available in snapshot)  Review of Systems  Constitutional: Negative.   HENT: Negative.   Eyes: Negative.   Respiratory: Negative for cough, chest tightness and shortness of breath.   Cardiovascular: Negative for chest pain, palpitations and leg swelling.  Gastrointestinal: Negative for abdominal distention, abdominal pain, constipation, diarrhea, nausea and vomiting.  Musculoskeletal: Negative.   Skin: Negative.   Neurological: Negative.   Psychiatric/Behavioral: Negative.       Objective:   Physical Exam  Constitutional: She is oriented to person, place, and time. She appears well-developed and well-nourished.  HENT:  Head: Normocephalic and atraumatic.  Eyes: EOM are normal.  Neck: Normal range of motion.  Cardiovascular: Normal rate and regular rhythm.  Pulmonary/Chest: Effort normal and breath sounds normal. No respiratory distress. She has no wheezes. She has  no rales.  Abdominal: Soft. Bowel sounds are normal. She exhibits no distension. There is no tenderness. There is no rebound.  Musculoskeletal: She exhibits no edema.  Neurological: She is alert and oriented to person, place, and time. Coordination normal.  Skin: Skin is warm and dry.  Psychiatric: She has a normal mood and affect.   Vitals:   02/25/18 1005  BP: 132/80  Pulse: 72  Temp: 98.2 F (36.8 C)  TempSrc: Oral  SpO2: 99%  Weight: 158 lb (71.7 kg)  Height: 5' 3.58" (1.615 m)      Assessment & Plan:

## 2018-02-25 NOTE — Assessment & Plan Note (Signed)
Flu shot declines. Pneumonia complete. Shingrix rx given today. Tetanus up to date. Colonoscopy up to date. Mammogram up to date, pap smear aged out and dexa up to date. Counseled about sun safety and mole surveillance. Counseled about the dangers of distracted driving. Given 10 year screening recommendations.

## 2018-02-25 NOTE — Assessment & Plan Note (Signed)
Uses pulmicort and albuterol prn. No flare currently.

## 2018-02-25 NOTE — Patient Instructions (Signed)

## 2018-03-02 ENCOUNTER — Encounter: Payer: Self-pay | Admitting: Internal Medicine

## 2018-03-02 NOTE — Telephone Encounter (Signed)
This encounter was created in error - please disregard.

## 2018-03-03 DIAGNOSIS — R69 Illness, unspecified: Secondary | ICD-10-CM | POA: Diagnosis not present

## 2018-03-16 ENCOUNTER — Telehealth: Payer: Self-pay | Admitting: Internal Medicine

## 2018-03-16 NOTE — Telephone Encounter (Signed)
Copied from [place] [id number]. Topic: General - Other >> Mar 16, 2018 10:28 AM Jessica Thompson, NT wrote: Reason for CRM: Patient called and said she would like something for anxiety , she is not currently stressed now bit would like to have medication just in case her responsibilities are increasing and she realizes she needs something to keep her insides  calm , please call her at  [phone] to discuss this further .

## 2018-03-16 NOTE — Telephone Encounter (Signed)
Appointment has been made for 10/4

## 2018-03-16 NOTE — Telephone Encounter (Signed)
Patient will need an office visit to discuss as she is not on anything currently for anxiety and I do not see where anything was discussed at last office visit.

## 2018-03-26 ENCOUNTER — Encounter: Payer: Self-pay | Admitting: Internal Medicine

## 2018-03-26 ENCOUNTER — Ambulatory Visit (INDEPENDENT_AMBULATORY_CARE_PROVIDER_SITE_OTHER): Payer: Medicare HMO | Admitting: Internal Medicine

## 2018-03-26 DIAGNOSIS — F439 Reaction to severe stress, unspecified: Secondary | ICD-10-CM | POA: Diagnosis not present

## 2018-03-26 DIAGNOSIS — R69 Illness, unspecified: Secondary | ICD-10-CM | POA: Diagnosis not present

## 2018-03-26 MED ORDER — BUSPIRONE HCL 5 MG PO TABS: 5.0000 mg | ORAL_TABLET | Freq: Two times a day (BID) | ORAL | 3 refills | Status: DC

## 2018-03-26 NOTE — Progress Notes (Signed)
   Subjective:    Patient ID: Jessica Thompson, female    DOB: 04/10/1951, 67 y.o.   MRN: [id number]  HPI The patient is a 67 YO female coming in for anxiety and increasing stress. Her bipolar daughter is moving back in with them and has 1 child and pregnant with a second child. This is very stressful to her. She is doing deep breathing and praying to help with stress which works most of the time. She is having times where she feels she is losing control and wants something to help. She does not want anything sedating or addicting. She needs to be clear headed at home. She does not feel bad every day or all the time. This is rare overall.   Review of Systems  Constitutional: Negative.   Respiratory: Negative for cough, chest tightness and shortness of breath.   Cardiovascular: Negative for chest pain, palpitations and leg swelling.  Gastrointestinal: Negative for abdominal distention, abdominal pain, constipation, diarrhea, nausea and vomiting.  Musculoskeletal: Negative.   Skin: Negative.   Neurological: Negative.   Psychiatric/Behavioral: Positive for decreased concentration and sleep disturbance. The patient is nervous/anxious.       Objective:   Physical Exam  Constitutional: She is oriented to person, place, and time. She appears well-developed and well-nourished.  HENT:  Head: Normocephalic and atraumatic.  Eyes: EOM are normal.  Neck: Normal range of motion.  Cardiovascular: Normal rate and regular rhythm.  Pulmonary/Chest: Effort normal and breath sounds normal. No respiratory distress. She has no wheezes. She has no rales.  Abdominal: Soft. Bowel sounds are normal. She exhibits no distension. There is no tenderness. There is no rebound.  Musculoskeletal: She exhibits no edema.  Neurological: She is alert and oriented to person, place, and time. Coordination normal.  Skin: Skin is warm and dry.  Psychiatric: She has a normal mood and affect.   Vitals:   03/26/18 1006  BP:  122/80  Pulse: 76  Temp: 98 F (36.7 C)  TempSrc: Oral  SpO2: 98%  Weight: 157 lb (71.2 kg)  Height: 5' 3.58" (1.615 m)      Assessment & Plan:

## 2018-03-26 NOTE — Patient Instructions (Signed)
We have sent in buspar that you can use if needed up to 3 times per day for anxiety or stress.   Keep using the deep breathing and prayer to help.

## 2018-03-26 NOTE — Assessment & Plan Note (Signed)
Rx for buspar to help if needed. Talked about using self-care, deep breathing, meditation, prayer, support network to help as well and she agrees.

## 2018-04-11 ENCOUNTER — Other Ambulatory Visit: Payer: Self-pay

## 2018-04-11 ENCOUNTER — Encounter (HOSPITAL_COMMUNITY): Payer: Self-pay | Admitting: *Deleted

## 2018-04-11 ENCOUNTER — Ambulatory Visit (HOSPITAL_COMMUNITY)
Admission: EM | Admit: 2018-04-11 | Discharge: 2018-04-11 | Disposition: A | Discharge status: Home / Self Care | Payer: Medicare HMO | Attending: Internal Medicine | Admitting: Internal Medicine

## 2018-04-11 DIAGNOSIS — B9789 Other viral agents as the cause of diseases classified elsewhere: Secondary | ICD-10-CM | POA: Diagnosis not present

## 2018-04-11 DIAGNOSIS — J069 Acute upper respiratory infection, unspecified: Secondary | ICD-10-CM | POA: Diagnosis not present

## 2018-04-11 MED ORDER — BENZONATATE 200 MG PO CAPS: 200.0000 mg | ORAL_CAPSULE | Freq: Three times a day (TID) | ORAL | 0 refills | Status: AC | PRN

## 2018-04-11 NOTE — Discharge Instructions (Signed)
Please continue to use Claritin Contin, Nasacort and Mucinex.   Please use your inhalers and nebulizers as needed for shortness of breath May add an Tessalon for cough  Continue to monitor symptoms and temperature, please follow-up if developing fever, shortness of breath, chest discomfort, difficulty breathing, wheezing, symptoms not improving in approximately 5 to 7 days; return sooner if worsening

## 2018-04-11 NOTE — ED Triage Notes (Signed)
C/O productive cough x 2 days with congestion; states worsening.  Denies fevers.

## 2018-04-11 NOTE — ED Provider Notes (Signed)
[place]    CSN: [id number] Arrival date & time: 04/11/18  1043     History   Chief Complaint Chief Complaint  Patient presents with  . Cough    HPI Jessica Thompson is a 67 y.o. female history of allergic rhinitis, asthma, presenting today for evaluation of cough and congestion.  Patient states that she has had a cough for the past 2 days.  She has also had associated productive cough and congestion.  Symptoms initially started with a scratchy throat, but she no longer has a sore throat.  She is also noticed some chest tightness, but has not felt as if her asthma is worsening.  She did use one nebulizer earlier today and has continued to use her Ventolin and Pulmicort as prescribed.  She is also taking Claritin, Nasacort and Mucinex.  Denies fevers.  HPI  Past Medical History:  Diagnosis Date  . Allergy   . Anemia    Betathalasemia minor  . Asthma   . Sinusitis     Patient Active Problem List   Diagnosis Date Noted  . Situational stress 03/26/2018  . Nasal polyposis 06/26/2015  . Mild intermittent asthma 06/26/2015  . Routine general medical examination at a health care facility 05/02/2015  . Allergic rhinitis 10/22/2014  . Thalassemia trait 10/22/2014    Past Surgical History:  Procedure Laterality Date  . POLYPECTOMY    . sinus sugery      OB History   None      Home Medications    Prior to Admission medications   Medication Sig Start Date End Date Taking? Authorizing Provider  albuterol (VENTOLIN HFA) 108 (90 Base) MCG/ACT inhaler INHALE 2 PUFFS BY MOUTH EVERY 6 HOURS AS NEEDED FOR WHEEZING OR SHORTNESS OF BREATH 02/25/18  Yes Hoyt Koch, MD  budesonide (PULMICORT) 180 MCG/ACT inhaler Inhale 1 puff into the lungs every 6 (six) hours as needed (wheezing). 02/25/18  Yes Hoyt Koch, MD  busPIRone (BUSPAR) 5 MG tablet Take 1 tablet (5 mg total) by mouth 2 (two) times daily. 03/26/18  Yes Hoyt Koch, MD    Calcium-Vitamin D (CALTRATE 600 PLUS-VIT D PO) Take 1 tablet by mouth daily. Reported on 06/26/2015   Yes [provider]  loratadine (CLARITIN) 10 MG tablet Take 10 mg by mouth 2 (two) times daily.   Yes [provider]  Multiple Vitamins-Minerals (CENTRUM SILVER ADULT 50+ PO) Take 1 tablet by mouth daily.   Yes [provider]  Omega-3 Fatty Acids (OMEGA 3 PO) Take by mouth daily.   Yes [provider]  triamcinolone (NASACORT) 55 MCG/ACT AERO nasal inhaler Place 2 sprays into the nose daily. Reported on 06/26/2015   Yes [provider]  benzonatate (TESSALON) 200 MG capsule Take 1 capsule (200 mg total) by mouth 3 (three) times daily as needed for up to 7 days for cough. 04/11/18 04/18/18  Delenn Ahn C, PA-C  guaiFENesin-codeine (ROBITUSSIN AC) 100-10 MG/5ML syrup Take 5-10 mLs by mouth at bedtime as needed for cough. 07/26/16   Ria Bush, MD    Family History Family History  Problem Relation Age of Onset  . Hyperlipidemia Mother   . Anemia Mother   . Hypertension Father   . Emphysema Father   . Heart disease Brother   . Liver disease Brother     Social History Social History   Tobacco Use  . Smoking status: Never Smoker  . Smokeless tobacco: Never Used  Substance Use Topics  .  Alcohol use: No  . Drug use: No     Allergies   Cabbage; Cheese; Corn-containing products; Erythromycin; Peanut-containing drug products; Soy allergy; Wheat bran; and Mushroom extract complex   Review of Systems Review of Systems  Constitutional: Negative for activity change, appetite change, chills, fatigue and fever.  HENT: Positive for congestion and rhinorrhea. Negative for ear pain, sinus pressure, sore throat and trouble swallowing.   Eyes: Negative for discharge and redness.  Respiratory: Positive for cough. Negative for chest tightness and shortness of breath.   Cardiovascular: Negative for chest pain.  Gastrointestinal: Negative for  abdominal pain, diarrhea, nausea and vomiting.  Musculoskeletal: Negative for myalgias.  Skin: Negative for rash.  Neurological: Negative for dizziness, light-headedness and headaches.     Physical Exam Triage Vital Signs ED Triage Vitals  Enc Vitals Group     BP 04/11/18 1124 (!) 157/78     Pulse Rate 04/11/18 1124 74     Resp 04/11/18 1124 20     Temp 04/11/18 1124 97.8 F (36.6 C)     Temp Source 04/11/18 1124 Oral     SpO2 04/11/18 1124 100 %     Weight --      Height --      Head Circumference --      Peak Flow --      Pain Score 04/11/18 1126 0     Pain Loc --      Pain Edu? --      Excl. in [place]? --    No data found.  Updated Vital Signs BP (!) 157/78   Pulse 74   Temp 97.8 F (36.6 C) (Oral)   Resp 20   SpO2 100%   Visual Acuity Right Eye Distance:   Left Eye Distance:   Bilateral Distance:    Right Eye Near:   Left Eye Near:    Bilateral Near:     Physical Exam  Constitutional: She appears well-developed and well-nourished. No distress.  HENT:  Head: Normocephalic and atraumatic.  Bilateral EACs with cerumen impaction, TMs not visualized bilaterally  Nasal mucosa boggy and pale  Oral mucosa pink and moist, no tonsillar enlargement or exudate. Posterior pharynx patent and nonerythematous, no uvula deviation or swelling.  Sounds congested.  Patient has slightly audible respiration, no stridor auscultated over trachea  Eyes: Conjunctivae are normal.  Neck: Neck supple.  Cardiovascular: Normal rate and regular rhythm.  No murmur heard. Pulmonary/Chest: Effort normal and breath sounds normal. No respiratory distress.  Breathing comfortably at rest, CTABL, no wheezing, rales or other adventitious sounds auscultated  Abdominal: Soft. There is no tenderness.  Musculoskeletal: She exhibits no edema.  Neurological: She is alert.  Skin: Skin is warm and dry.  Psychiatric: She has a normal mood and affect.  Nursing note and vitals reviewed.    UC  Treatments / Results  Labs (all labs ordered are listed, but only abnormal results are displayed) Labs Reviewed - No data to display  EKG None  Radiology No results found.  Procedures Procedures (including critical care time)  Medications Ordered in UC Medications - No data to display  Initial Impression / Assessment and Plan / UC Course  I have reviewed the triage vital signs and the nursing notes.  Pertinent labs & imaging results that were available during my care of the patient were reviewed by me and considered in my medical decision making (see chart for details).     Patient with URI symptoms for 2 days.  Most likely viral etiology.  Vital signs stable without fever, tachycardia or hypoxia.  Exam unremarkable, no wheezing or adventitious sounds auscultated in lungs, does not appear to have exacerbation of her asthma at this time.  Patient has no acute distress, nontoxic-appearing.  Discussed with patient continuing symptomatic management that she is currently doing, will add in Tessalon for cough.  Will hold off on prednisone at this point as patient not wheezing or having worsening of her asthma symptoms.  Advised her to keep a close eye on her breathing, temperature and follow-up if developing fever, shortness of breath, worsening chest tightness or difficulty breathing.Discussed strict return precautions. Patient verbalized understanding and is agreeable with plan.  Final Clinical Impressions(s) / UC Diagnoses   Final diagnoses:  Viral URI with cough     Discharge Instructions     Please continue to use Claritin Contin, Nasacort and Mucinex.   Please use your inhalers and nebulizers as needed for shortness of breath May add an Tessalon for cough  Continue to monitor symptoms and temperature, please follow-up if developing fever, shortness of breath, chest discomfort, difficulty breathing, wheezing, symptoms not improving in approximately 5 to 7 days; return sooner if  worsening   ED Prescriptions    Medication Sig Dispense Auth. Provider   benzonatate (TESSALON) 200 MG capsule Take 1 capsule (200 mg total) by mouth 3 (three) times daily as needed for up to 7 days for cough. 28 capsule Nimah Uphoff C, PA-C     Controlled Substance Prescriptions [place] Controlled Substance Registry consulted? Not Applicable   Janith Lima, [place] 04/11/18 1155

## 2018-04-12 ENCOUNTER — Telehealth: Payer: Self-pay | Admitting: Internal Medicine

## 2018-04-12 NOTE — Telephone Encounter (Signed)
FYI

## 2018-04-12 NOTE — Telephone Encounter (Signed)
Received [facility] call report from 10/20 at 10:07 am.  States patient had cold symptoms and cough.  Has been using inhalers and nebulizer.  Denies any difficulty breathing at time of call.  States is coughing up yellow sputum with nasal congestion.  States has had cough for two days.  Chest was tight the night before.   Patient was advised to see PCP within 24 hours. Looks like patient was seen 10/20 at ED.

## 2018-04-21 ENCOUNTER — Ambulatory Visit (INDEPENDENT_AMBULATORY_CARE_PROVIDER_SITE_OTHER): Payer: Medicare HMO | Admitting: Family

## 2018-04-21 ENCOUNTER — Encounter: Payer: Self-pay | Admitting: Family

## 2018-04-21 VITALS — BP 130/82 | HR 82 | Temp 98.2°F | Ht 63.58 in | Wt 158.1 lb

## 2018-04-21 DIAGNOSIS — J209 Acute bronchitis, unspecified: Secondary | ICD-10-CM | POA: Diagnosis not present

## 2018-04-21 MED ORDER — AZITHROMYCIN 250 MG PO TABS: ORAL_TABLET | ORAL | 0 refills | Status: DC

## 2018-04-21 NOTE — Progress Notes (Signed)
Jessica Thompson is a 67 y.o. female with the following history as recorded in [employer]:  Patient Active Problem List   Diagnosis Date Noted  . Situational stress 03/26/2018  . Nasal polyposis 06/26/2015  . Mild intermittent asthma 06/26/2015  . Routine general medical examination at a health care facility 05/02/2015  . Allergic rhinitis 10/22/2014  . Thalassemia trait 10/22/2014    Current Outpatient Medications  Medication Sig Dispense Refill  . albuterol (VENTOLIN HFA) 108 (90 Base) MCG/ACT inhaler INHALE 2 PUFFS BY MOUTH EVERY 6 HOURS AS NEEDED FOR WHEEZING OR SHORTNESS OF BREATH 18 g 0  . budesonide (PULMICORT) 180 MCG/ACT inhaler Inhale 1 puff into the lungs every 6 (six) hours as needed (wheezing). 3 Inhaler 4  . busPIRone (BUSPAR) 5 MG tablet Take 1 tablet (5 mg total) by mouth 2 (two) times daily. 30 tablet 3  . Calcium-Vitamin D (CALTRATE 600 PLUS-VIT D PO) Take 1 tablet by mouth daily. Reported on 06/26/2015    . FLUAD 0.5 ML SUSY     . guaiFENesin (MUCINEX) 600 MG 12 hr tablet Take by mouth 2 (two) times daily.    Marland Kitchen loratadine (CLARITIN) 10 MG tablet Take 10 mg by mouth 2 (two) times daily.    . montelukast (SINGULAIR) 10 MG tablet Take by mouth.    . Multiple Vitamins-Minerals (CENTRUM SILVER ADULT 50+ PO) Take 1 tablet by mouth daily.    . Omega-3 Fatty Acids (OMEGA 3 PO) Take by mouth daily.    Marland Kitchen triamcinolone (NASACORT) 55 MCG/ACT AERO nasal inhaler Place 2 sprays into the nose daily. Reported on 06/26/2015    . azithromycin (ZITHROMAX) 250 MG tablet 2 tabs po qd x 1 day; 1 tablet per day x 4 days; 6 tablet 0   No current facility-administered medications for this visit.     Allergies: Cabbage; Cheese; Corn-containing products; Erythromycin; Peanut-containing drug products; Soy allergy; Wheat bran; and Mushroom extract complex  Past Medical History:  Diagnosis Date  . Allergy   . Anemia    Betathalasemia minor  . Asthma   . Sinusitis     Past Surgical History:   Procedure Laterality Date  . POLYPECTOMY    . sinus sugery      Family History  Problem Relation Age of Onset  . Hyperlipidemia Mother   . Anemia Mother   . Hypertension Father   . Emphysema Father   . Heart disease Brother   . Liver disease Brother     Social History   Tobacco Use  . Smoking status: Never Smoker  . Smokeless tobacco: Never Used  Substance Use Topics  . Alcohol use: No    Subjective:  Patient presents with concerns for persisting cough/ congestion; went to [facility] on Sunday- started on Tessalon Perles; has been using nebulizer at home; no fever, no night sweats; Taking her Pulmicort daily; feels like she just can't shake the symptoms- wonders about antibiotic;    Objective:  Vitals:   04/21/18 1034  BP: 130/82  Pulse: 82  Temp: 98.2 F (36.8 C)  TempSrc: Oral  SpO2: 99%  Weight: 158 lb 1.9 oz (71.7 kg)  Height: 5' 3.58" (1.615 m)    General: Well developed, well nourished, in no acute distress  Skin : Warm and dry.  Head: Normocephalic and atraumatic  Lungs: Respirations unlabored; clear to auscultation bilaterally without wheeze, rales, rhonchi  CVS exam: normal rate and regular rhythm.  Abdomen: Soft; nontender; nondistended; normoactive bowel sounds; no masses or hepatosplenomegaly  Neurologic: Alert and oriented; speech intact; face symmetrical; moves all extremities well; CNII-XII intact without focal deficit  Assessment:   1. Acute bronchitis, unspecified organism     Plan:  Rx for Z-pak #1 take as directed; continue Tessalon Perles, Pulmicort, home nebulizer; increase fluids, rest; follow-up if symptoms still present in 48 hours and will add oral steroids;  No follow-ups on file.  No orders of the defined types were placed in this encounter.   Requested Prescriptions   Signed Prescriptions Disp Refills  . azithromycin (ZITHROMAX) 250 MG tablet 6 tablet 0    Sig: 2 tabs po qd x 1 day; 1 tablet per day x 4 days;

## 2018-05-27 DIAGNOSIS — H4422 Degenerative myopia, left eye: Secondary | ICD-10-CM | POA: Diagnosis not present

## 2018-05-27 DIAGNOSIS — H5231 Anisometropia: Secondary | ICD-10-CM | POA: Diagnosis not present

## 2018-05-27 DIAGNOSIS — H5213 Myopia, bilateral: Secondary | ICD-10-CM | POA: Diagnosis not present

## 2018-09-02 ENCOUNTER — Ambulatory Visit (INDEPENDENT_AMBULATORY_CARE_PROVIDER_SITE_OTHER): Payer: Medicare HMO | Admitting: Internal Medicine

## 2018-09-02 ENCOUNTER — Encounter: Payer: Self-pay | Admitting: Internal Medicine

## 2018-09-02 DIAGNOSIS — J4520 Mild intermittent asthma, uncomplicated: Secondary | ICD-10-CM

## 2018-09-02 NOTE — Assessment & Plan Note (Signed)
Rx for promethazine/dm cough medicine given in physical form due to downtime of system. No indication for antibiotics or steroids today. Will call back for any worsening or SOB.

## 2018-09-02 NOTE — Progress Notes (Signed)
   Subjective:   Patient ID: Jessica Thompson, female    DOB: Jul 16, 1950, 68 y.o.   MRN: [id number]  HPI The patient is a 68 YO female coming in for 5 days of cough and SOB. She does have concurrent asthma which is overall controlled well. Takes Pulmicort daily and uses albuterol prn and nebulizer as needed. Since this started she has been using nebulizer treatments at least once a day. She denies fevers or chills. She denies body aches. She denies known sick contacts. Overall she feels like she is mildly improving. She is coughing up yellow sputum. Denies SOB.   Review of Systems  Constitutional: Positive for activity change. Negative for appetite change, chills, fatigue, fever and unexpected weight change.  HENT: Positive for congestion. Negative for ear discharge, ear pain, postnasal drip, rhinorrhea, sinus pressure, sinus pain, sneezing, sore throat, tinnitus, trouble swallowing and voice change.   Eyes: Negative.   Respiratory: Positive for cough. Negative for chest tightness, shortness of breath and wheezing.   Cardiovascular: Negative.   Gastrointestinal: Negative.   Musculoskeletal: Negative.   Neurological: Negative.     Objective:  Physical Exam Constitutional:      Appearance: She is well-developed.  HENT:     Head: Normocephalic and atraumatic.     Comments: Oropharynx with redness and clear drainage, nose with swollen turbinates, TMs normal bilaterally.  Neck:     Musculoskeletal: Normal range of motion.     Thyroid: No thyromegaly.  Cardiovascular:     Rate and Rhythm: Normal rate and regular rhythm.  Pulmonary:     Effort: Pulmonary effort is normal. No respiratory distress.     Breath sounds: Normal breath sounds. No wheezing or rales.  Abdominal:     Palpations: Abdomen is soft.  Musculoskeletal:        General: Tenderness present.  Lymphadenopathy:     Cervical: No cervical adenopathy.  Skin:    General: Skin is warm and dry.  Neurological:     Mental  Status: She is alert and oriented to person, place, and time.     Vitals:   09/02/18 1104  BP: 138/70  Pulse: 83  Temp: 97.9 F (36.6 C)  TempSrc: Oral  SpO2: 98%  Weight: 156 lb (70.8 kg)  Height: 5' 3.58" (1.615 m)    Assessment & Plan:

## 2018-09-27 ENCOUNTER — Other Ambulatory Visit: Payer: Self-pay | Admitting: Internal Medicine

## 2018-12-08 DIAGNOSIS — R69 Illness, unspecified: Secondary | ICD-10-CM | POA: Diagnosis not present

## 2018-12-30 ENCOUNTER — Other Ambulatory Visit: Payer: Self-pay | Admitting: Internal Medicine

## 2019-03-15 ENCOUNTER — Other Ambulatory Visit: Payer: Self-pay

## 2019-03-15 MED ORDER — BUSPIRONE HCL 5 MG PO TABS: 5.0000 mg | ORAL_TABLET | Freq: Two times a day (BID) | ORAL | 0 refills | Status: DC

## 2019-03-22 NOTE — Progress Notes (Signed)
Subjective:   Jessica Thompson is a 68 y.o. female who presents for Medicare Annual (Subsequent) preventive examination.  I connected with patient 03/23/19 at 11:00 AM EDT by a video/audio enabled telemedicine application and verified that I am speaking with the correct person using two identifiers. Patient stated full name and DOB. Patient gave permission to continue with virtual visit. Patient's location was at home and Nurse's location was at [place] office.   Review of Systems:  Cardiac Risk Factors include: advanced age (>72men, >66 women) Sleep patterns: feels rested on waking and sleeps 6-7 hours nightly.    Home Safety/Smoke Alarms: Feels safe in home. Smoke alarms in place.  Living environment; residence and Firearm Safety: [place] with husband, no needs for DME, good support system. Seat Belt Safety/Bike Helmet: Wears seat belt.     Objective:     Vitals: There were no vitals taken for this visit.  There is no height or weight on file to calculate BMI.  Advanced Directives 03/23/2019  Does Patient Have a Medical Advance Directive? Yes  Type of Paramedic of [place];Living will  Copy of [place] in Chart? No - copy requested    Tobacco Social History   Tobacco Use  Smoking Status Never Smoker  Smokeless Tobacco Never Used     Counseling given: Not Answered   Past Medical History:  Diagnosis Date   Allergy    Anemia    Betathalasemia minor   Asthma    Sinusitis    Past Surgical History:  Procedure Laterality Date   POLYPECTOMY     sinus sugery     Family History  Problem Relation Age of Onset   Hyperlipidemia Mother    Anemia Mother    Hypertension Father    Emphysema Father    Heart disease Brother    Liver disease Brother    Social History   Socioeconomic History   Marital status: Married    Spouse name: Not on file   Number of children: Not on file   Years of  education: Not on file   Highest education level: Not on file  Occupational History   Occupation: retired  Scientist, product/process development strain: Not hard at all   Food insecurity    Worry: Never true    Inability: Never true   Transportation needs    Medical: No    Non-medical: No  Tobacco Use   Smoking status: Never Smoker   Smokeless tobacco: Never Used  Substance and Sexual Activity   Alcohol use: No   Drug use: No   Sexual activity: Not Currently  Lifestyle   Physical activity    Days per week: 0 days    Minutes per session: 0 min   Stress: Only a little  Relationships   Social connections    Talks on phone: More than three times a week    Gets together: More than three times a week    Attends religious service: More than 4 times per year    Active member of club or organization: Yes    Attends meetings of clubs or organizations: More than 4 times per year    Relationship status: Married  Other Topics Concern   Not on file  Social History Narrative   Not on file    Outpatient Encounter Medications as of 03/23/2019  Medication Sig   budesonide (PULMICORT) 180 MCG/ACT inhaler Inhale 1 puff into the lungs every  6 (six) hours as needed (wheezing).   busPIRone (BUSPAR) 5 MG tablet Take 1 tablet (5 mg total) by mouth 2 (two) times daily. Need appointment for further refills   Calcium-Vitamin D (CALTRATE 600 PLUS-VIT D PO) Take 1 tablet by mouth daily. Reported on 06/26/2015   guaiFENesin (MUCINEX) 600 MG 12 hr tablet Take by mouth 2 (two) times daily.   loratadine (CLARITIN) 10 MG tablet Take 10 mg by mouth 2 (two) times daily.   Multiple Vitamins-Minerals (CENTRUM SILVER ADULT 50+ PO) Take 1 tablet by mouth daily.   Omega-3 Fatty Acids (OMEGA 3 PO) Take by mouth daily.   triamcinolone (NASACORT) 55 MCG/ACT AERO nasal inhaler Place 2 sprays into the nose daily. Reported on 06/26/2015   [DISCONTINUED] albuterol (PROVENTIL HFA;VENTOLIN HFA) 108 (90  Base) MCG/ACT inhaler INHALE 2 PUFFS BY MOUTH EVERY 6 HOURS AS NEEDED FOR WHEEZING OR SHORTNESS OF BREATH   [DISCONTINUED] FLUAD 0.5 ML SUSY    [DISCONTINUED] montelukast (SINGULAIR) 10 MG tablet Take by mouth.   No facility-administered encounter medications on file as of 03/23/2019.     Activities of Daily Living In your present state of health, do you have any difficulty performing the following activities: 03/23/2019  Hearing? N  Vision? N  Difficulty concentrating or making decisions? N  Walking or climbing stairs? N  Dressing or bathing? N  Doing errands, shopping? N  Preparing Food and eating ? N  Using the Toilet? N  In the past six months, have you accidently leaked urine? N  Do you have problems with loss of bowel control? N  Managing your Medications? N  Managing your Finances? N  Housekeeping or managing your Housekeeping? N  Some recent data might be hidden    Patient Care Team: Hoyt Koch, MD as PCP - General (Internal Medicine)    Assessment:   This is a routine wellness examination for [place]. Physical assessment deferred to PCP.  Exercise Activities and Dietary recommendations Current Exercise Habits: The patient does not participate in regular exercise at present, Exercise limited by: None identified Diet (meal preparation, eat out, water intake, caffeinated beverages, dairy products, fruits and vegetables): in general, a "healthy" diet  , well balanced   Encouraged patient to increase daily water and healthy fluid intake.   Goals     Patient Stated     Take time        Fall Risk Fall Risk  03/23/2019 09/02/2018 02/25/2018 05/02/2015  Falls in the past year? 1 0 No No  Number falls in past yr: 0 - - -  Injury with Fall? 0 - - -   Is the patient's home free of loose throw rugs in walkways, pet beds, electrical cords, etc?   yes      Grab bars in the bathroom? yes      Handrails on the stairs?   yes      Adequate lighting?    yes   Depression Screen PHQ 2/9 Scores 03/23/2019 09/02/2018 02/25/2018 05/02/2015  PHQ - 2 Score 0 0 0 0     Cognitive Function       Ad8 score reviewed for issues:  Issues making decisions: no  Less interest in hobbies / activities: no  Repeats questions, stories (family complaining): no  Trouble using ordinary gadgets (microwave, computer, phone):no  Forgets the month or year: no  Mismanaging finances: no  Remembering appts: no  Daily problems with thinking and/or memory: no Ad8 score is= 0  Immunization History  Administered Date(s) Administered   Influenza-Unspecified 02/21/2014   Pneumococcal Conjugate-13 05/02/2015   Pneumococcal Polysaccharide-23 10/05/2013   Tdap 07/05/2013   Screening Tests Health Maintenance  Topic Date Due   INFLUENZA VACCINE  01/22/2019   MAMMOGRAM  09/02/2019 (Originally 07/04/2018)   COLONOSCOPY  03/28/2021   TETANUS/TDAP  07/06/2023   DEXA SCAN  Completed   Hepatitis C Screening  Completed   PNA vac Low Risk Adult  Completed      Plan:   Reviewed health maintenance screenings with patient today and relevant education, vaccines, and/or referrals were provided.   I have personally reviewed and noted the following in the patients chart:    Medical and social history  Use of alcohol, tobacco or illicit drugs   Current medications and supplements  Functional ability and status  Nutritional status  Physical activity  Advanced directives  List of other physicians  Screenings to include cognitive, depression, and falls  Referrals and appointments  In addition, I have reviewed and discussed with patient certain preventive protocols, quality metrics, and best practice recommendations. A written personalized care plan for preventive services as well as general preventive health recommendations were provided to patient.     Michiel Cowboy, RN  03/23/2019

## 2019-03-23 ENCOUNTER — Ambulatory Visit (INDEPENDENT_AMBULATORY_CARE_PROVIDER_SITE_OTHER): Payer: Medicare HMO | Admitting: *Deleted

## 2019-03-23 DIAGNOSIS — Z0000 Encounter for general adult medical examination without abnormal findings: Secondary | ICD-10-CM | POA: Diagnosis not present

## 2019-03-23 NOTE — Progress Notes (Signed)
Medical screening examination/treatment/procedure(s) were performed by non-physician practitioner and as supervising physician I was immediately available for consultation/collaboration. I agree with above. Cole Eastridge A Alwaleed Obeso, MD 

## 2019-04-07 ENCOUNTER — Other Ambulatory Visit (INDEPENDENT_AMBULATORY_CARE_PROVIDER_SITE_OTHER): Payer: Medicare HMO

## 2019-04-07 ENCOUNTER — Ambulatory Visit (INDEPENDENT_AMBULATORY_CARE_PROVIDER_SITE_OTHER): Payer: Medicare HMO | Admitting: Internal Medicine

## 2019-04-07 ENCOUNTER — Other Ambulatory Visit: Payer: Self-pay

## 2019-04-07 ENCOUNTER — Encounter: Payer: Self-pay | Admitting: Internal Medicine

## 2019-04-07 VITALS — BP 130/86 | HR 67 | Temp 97.9°F | Ht 63.58 in | Wt 146.0 lb

## 2019-04-07 DIAGNOSIS — F439 Reaction to severe stress, unspecified: Secondary | ICD-10-CM | POA: Diagnosis not present

## 2019-04-07 DIAGNOSIS — J4520 Mild intermittent asthma, uncomplicated: Secondary | ICD-10-CM

## 2019-04-07 DIAGNOSIS — Z0000 Encounter for general adult medical examination without abnormal findings: Secondary | ICD-10-CM

## 2019-04-07 DIAGNOSIS — R69 Illness, unspecified: Secondary | ICD-10-CM | POA: Diagnosis not present

## 2019-04-07 LAB — LIPID PANEL
Cholesterol: 200 mg/dL (ref 0–200)
HDL: 64.1 mg/dL (ref >39.00)
LDL Cholesterol: 122 mg/dL — ABNORMAL HIGH (ref 0–99)
NonHDL: 136.01
Total CHOL/HDL Ratio: 3
Triglycerides: 68 mg/dL (ref 0.0–149.0)
VLDL: 13.6 mg/dL (ref 0.0–40.0)

## 2019-04-07 LAB — COMPREHENSIVE METABOLIC PANEL
ALT: 13 U/L (ref 0–35)
AST: 19 U/L (ref 0–37)
Albumin: 4.2 g/dL (ref 3.5–5.2)
Alkaline Phosphatase: 40 U/L (ref 39–117)
BUN: 17 mg/dL (ref 6–23)
CO2: 28 mEq/L (ref 19–32)
Calcium: 9.9 mg/dL (ref 8.4–10.5)
Chloride: 105 mEq/L (ref 96–112)
Creatinine, Ser: 0.98 mg/dL (ref 0.40–1.20)
GFR: 67.76 mL/min (ref >60.00)
Glucose, Bld: 90 mg/dL (ref 70–99)
Potassium: 4 mEq/L (ref 3.5–5.1)
Sodium: 138 mEq/L (ref 135–145)
Total Bilirubin: 0.5 mg/dL (ref 0.2–1.2)
Total Protein: 7.3 g/dL (ref 6.0–8.3)

## 2019-04-07 LAB — CBC
HCT: 30.2 % — ABNORMAL LOW (ref 36.0–46.0)
Hemoglobin: 9.5 g/dL — ABNORMAL LOW (ref 12.0–15.0)
MCHC: 31.4 g/dL (ref 30.0–36.0)
MCV: 71 fl — ABNORMAL LOW (ref 78.0–100.0)
Platelets: 221 10*3/uL (ref 150.0–400.0)
RBC: 4.26 Mil/uL (ref 3.87–5.11)
RDW: 16.5 % — ABNORMAL HIGH (ref 11.5–15.5)
WBC: 5 10*3/uL (ref 4.0–10.5)

## 2019-04-07 MED ORDER — BUSPIRONE HCL 5 MG PO TABS: 5.0000 mg | ORAL_TABLET | Freq: Two times a day (BID) | ORAL | 11 refills | Status: DC

## 2019-04-07 MED ORDER — BUDESONIDE 180 MCG/ACT IN AEPB: 1.0000 | INHALATION_SPRAY | Freq: Four times a day (QID) | RESPIRATORY_TRACT | 3 refills | Status: DC | PRN

## 2019-04-07 NOTE — Progress Notes (Signed)
   Subjective:   Patient ID: Jessica Thompson, female    DOB: July 19, 1950, 68 y.o.   MRN: [id number]  HPI The patient is a 68 YO female coming in for physical.   PMH, [place], social history reviewed and updated  Review of Systems  Constitutional: Negative.   HENT: Negative.   Eyes: Negative.   Respiratory: Negative for cough, chest tightness and shortness of breath.   Cardiovascular: Negative for chest pain, palpitations and leg swelling.  Gastrointestinal: Negative for abdominal distention, abdominal pain, constipation, diarrhea, nausea and vomiting.  Musculoskeletal: Negative.   Skin: Negative.   Neurological: Negative.   Psychiatric/Behavioral: Negative.     Objective:  Physical Exam Constitutional:      Appearance: She is well-developed.  HENT:     Head: Normocephalic and atraumatic.  Neck:     Musculoskeletal: Normal range of motion.  Cardiovascular:     Rate and Rhythm: Normal rate and regular rhythm.  Pulmonary:     Effort: Pulmonary effort is normal. No respiratory distress.     Breath sounds: Normal breath sounds. No wheezing or rales.  Abdominal:     General: Bowel sounds are normal. There is no distension.     Palpations: Abdomen is soft.     Tenderness: There is no abdominal tenderness. There is no rebound.  Skin:    General: Skin is warm and dry.  Neurological:     Mental Status: She is alert and oriented to person, place, and time.     Coordination: Coordination normal.     Vitals:   04/07/19 1043  BP: 130/86  Pulse: 67  Temp: 97.9 F (36.6 C)  TempSrc: Oral  SpO2: 99%  Weight: 146 lb (66.2 kg)  Height: 5' 3.58" (1.615 m)    Assessment & Plan:

## 2019-04-07 NOTE — Assessment & Plan Note (Signed)
Flu shot getting at pharmacy. Pneumonia complete. Shingrix counseled. Tetanus up to date. Colonoscopy up to date. Mammogram up to date, pap smear aged out and dexa due, declines to next year with pandemic. Counseled about sun safety and mole surveillance. Counseled about the dangers of distracted driving. Given 10 year screening recommendations.

## 2019-04-07 NOTE — Patient Instructions (Signed)
Health Maintenance, Female Adopting a healthy lifestyle and getting preventive care are important in promoting health and wellness. Ask your health care provider about:  The right schedule for you to have regular tests and exams.  Things you can do on your own to prevent diseases and keep yourself healthy. What should I know about diet, weight, and exercise? Eat a healthy diet   Eat a diet that includes plenty of vegetables, fruits, low-fat dairy products, and lean protein.  Do not eat a lot of foods that are high in solid fats, added sugars, or sodium. Maintain a healthy weight Body mass index (BMI) is used to identify weight problems. It estimates body fat based on height and weight. Your health care provider can help determine your BMI and help you achieve or maintain a healthy weight. Get regular exercise Get regular exercise. This is one of the most important things you can do for your health. Most adults should:  Exercise for at least 150 minutes each week. The exercise should increase your heart rate and make you sweat (moderate-intensity exercise).  Do strengthening exercises at least twice a week. This is in addition to the moderate-intensity exercise.  Spend less time sitting. Even light physical activity can be beneficial. Watch cholesterol and blood lipids Have your blood tested for lipids and cholesterol at 70 years of age, then have this test every 5 years. Have your cholesterol levels checked more often if:  Your lipid or cholesterol levels are high.  You are older than 70 years of age.  You are at high risk for heart disease. What should I know about cancer screening? Depending on your health history and family history, you may need to have cancer screening at various ages. This may include screening for:  Breast cancer.  Cervical cancer.  Colorectal cancer.  Skin cancer.  Lung cancer. What should I know about heart disease, diabetes, and high blood  pressure? Blood pressure and heart disease  High blood pressure causes heart disease and increases the risk of stroke. This is more likely to develop in people who have high blood pressure readings, are of African descent, or are overweight.  Have your blood pressure checked: ? Every 3-5 years if you are 18-39 years of age. ? Every year if you are 40 years old or older. Diabetes Have regular diabetes screenings. This checks your fasting blood sugar level. Have the screening done:  Once every three years after age 40 if you are at a normal weight and have a low risk for diabetes.  More often and at a younger age if you are overweight or have a high risk for diabetes. What should I know about preventing infection? Hepatitis B If you have a higher risk for hepatitis B, you should be screened for this virus. Talk with your health care provider to find out if you are at risk for hepatitis B infection. Hepatitis C Testing is recommended for:  Everyone born from 1945 through 1965.  Anyone with known risk factors for hepatitis C. Sexually transmitted infections (STIs)  Get screened for STIs, including gonorrhea and chlamydia, if: ? You are sexually active and are younger than 70 years of age. ? You are older than 70 years of age and your health care provider tells you that you are at risk for this type of infection. ? Your sexual activity has changed since you were last screened, and you are at increased risk for chlamydia or gonorrhea. Ask your health care provider if   you are at risk.  Ask your health care provider about whether you are at high risk for HIV. Your health care provider may recommend a prescription medicine to help prevent HIV infection. If you choose to take medicine to prevent HIV, you should first get tested for HIV. You should then be tested every 3 months for as long as you are taking the medicine. Pregnancy  If you are about to stop having your period (premenopausal) and  you may become pregnant, seek counseling before you get pregnant.  Take 400 to 800 micrograms (mcg) of folic acid every day if you become pregnant.  Ask for birth control (contraception) if you want to prevent pregnancy. Osteoporosis and menopause Osteoporosis is a disease in which the bones lose minerals and strength with aging. This can result in bone fractures. If you are 65 years old or older, or if you are at risk for osteoporosis and fractures, ask your health care provider if you should:  Be screened for bone loss.  Take a calcium or vitamin D supplement to lower your risk of fractures.  Be given hormone replacement therapy (HRT) to treat symptoms of menopause. Follow these instructions at home: Lifestyle  Do not use any products that contain nicotine or tobacco, such as cigarettes, e-cigarettes, and chewing tobacco. If you need help quitting, ask your health care provider.  Do not use street drugs.  Do not share needles.  Ask your health care provider for help if you need support or information about quitting drugs. Alcohol use  Do not drink alcohol if: ? Your health care provider tells you not to drink. ? You are pregnant, may be pregnant, or are planning to become pregnant.  If you drink alcohol: ? Limit how much you use to 0-1 drink a day. ? Limit intake if you are breastfeeding.  Be aware of how much alcohol is in your drink. In the [place] one drink equals one 12 oz bottle of beer (355 mL), one 5 oz glass of wine (148 mL), or one 1 oz glass of hard liquor (44 mL). General instructions  Schedule regular health, dental, and eye exams.  Stay current with your vaccines.  Tell your health care provider if: ? You often feel depressed. ? You have ever been abused or do not feel safe at home. Summary  Adopting a healthy lifestyle and getting preventive care are important in promoting health and wellness.  Follow your health care provider's instructions about healthy  diet, exercising, and getting tested or screened for diseases.  Follow your health care provider's instructions on monitoring your cholesterol and blood pressure. This information is not intended to replace advice given to you by your health care provider. Make sure you discuss any questions you have with your health care provider. Document Released: 12/23/2010 Document Revised: 06/02/2018 Document Reviewed: 06/02/2018 Elsevier Patient Education  2020 [employer]  

## 2019-04-07 NOTE — Assessment & Plan Note (Signed)
Buspar daily is helping well still.

## 2019-04-07 NOTE — Assessment & Plan Note (Signed)
Uses pulmicort prn, no flare today.

## 2019-06-08 DIAGNOSIS — R69 Illness, unspecified: Secondary | ICD-10-CM | POA: Diagnosis not present

## 2019-09-12 DIAGNOSIS — Z20828 Contact with and (suspected) exposure to other viral communicable diseases: Secondary | ICD-10-CM | POA: Diagnosis not present

## 2019-09-27 DIAGNOSIS — K5900 Constipation, unspecified: Secondary | ICD-10-CM | POA: Diagnosis not present

## 2019-09-27 DIAGNOSIS — R030 Elevated blood-pressure reading, without diagnosis of hypertension: Secondary | ICD-10-CM | POA: Diagnosis not present

## 2019-09-27 DIAGNOSIS — Z8249 Family history of ischemic heart disease and other diseases of the circulatory system: Secondary | ICD-10-CM | POA: Diagnosis not present

## 2019-09-27 DIAGNOSIS — J45909 Unspecified asthma, uncomplicated: Secondary | ICD-10-CM | POA: Diagnosis not present

## 2019-09-28 ENCOUNTER — Other Ambulatory Visit: Payer: Self-pay | Admitting: Internal Medicine

## 2019-09-28 DIAGNOSIS — Z1231 Encounter for screening mammogram for malignant neoplasm of breast: Secondary | ICD-10-CM

## 2019-10-05 DIAGNOSIS — R69 Illness, unspecified: Secondary | ICD-10-CM | POA: Diagnosis not present

## 2019-10-12 DIAGNOSIS — R69 Illness, unspecified: Secondary | ICD-10-CM | POA: Diagnosis not present

## 2019-10-21 ENCOUNTER — Other Ambulatory Visit: Payer: Self-pay | Admitting: Internal Medicine

## 2019-10-27 DIAGNOSIS — R69 Illness, unspecified: Secondary | ICD-10-CM | POA: Diagnosis not present

## 2019-11-07 DIAGNOSIS — H5231 Anisometropia: Secondary | ICD-10-CM | POA: Diagnosis not present

## 2019-11-07 DIAGNOSIS — H31002 Unspecified chorioretinal scars, left eye: Secondary | ICD-10-CM | POA: Diagnosis not present

## 2019-11-07 DIAGNOSIS — H2513 Age-related nuclear cataract, bilateral: Secondary | ICD-10-CM | POA: Diagnosis not present

## 2019-11-07 DIAGNOSIS — H5213 Myopia, bilateral: Secondary | ICD-10-CM | POA: Diagnosis not present

## 2020-04-10 DIAGNOSIS — R69 Illness, unspecified: Secondary | ICD-10-CM | POA: Diagnosis not present

## 2020-04-11 ENCOUNTER — Other Ambulatory Visit: Payer: Self-pay

## 2020-04-11 ENCOUNTER — Encounter: Payer: Self-pay | Admitting: Internal Medicine

## 2020-04-11 ENCOUNTER — Ambulatory Visit (INDEPENDENT_AMBULATORY_CARE_PROVIDER_SITE_OTHER): Payer: Medicare HMO | Admitting: Internal Medicine

## 2020-04-11 VITALS — BP 128/74 | HR 83 | Temp 98.4°F | Ht 63.8 in | Wt 133.0 lb

## 2020-04-11 DIAGNOSIS — J4520 Mild intermittent asthma, uncomplicated: Secondary | ICD-10-CM

## 2020-04-11 DIAGNOSIS — H6123 Impacted cerumen, bilateral: Secondary | ICD-10-CM | POA: Diagnosis not present

## 2020-04-11 DIAGNOSIS — D563 Thalassemia minor: Secondary | ICD-10-CM | POA: Diagnosis not present

## 2020-04-11 DIAGNOSIS — Z0000 Encounter for general adult medical examination without abnormal findings: Secondary | ICD-10-CM | POA: Diagnosis not present

## 2020-04-11 DIAGNOSIS — E785 Hyperlipidemia, unspecified: Secondary | ICD-10-CM

## 2020-04-11 DIAGNOSIS — Z1231 Encounter for screening mammogram for malignant neoplasm of breast: Secondary | ICD-10-CM | POA: Insufficient documentation

## 2020-04-11 DIAGNOSIS — Z23 Encounter for immunization: Secondary | ICD-10-CM | POA: Insufficient documentation

## 2020-04-11 LAB — BASIC METABOLIC PANEL
BUN: 17 mg/dL (ref 6–23)
CO2: 33 mEq/L — ABNORMAL HIGH (ref 19–32)
Calcium: 9.8 mg/dL (ref 8.4–10.5)
Chloride: 103 mEq/L (ref 96–112)
Creatinine, Ser: 0.93 mg/dL (ref 0.40–1.20)
GFR: 61.54 mL/min (ref >60.00)
Glucose, Bld: 78 mg/dL (ref 70–99)
Potassium: 4.7 mEq/L (ref 3.5–5.1)
Sodium: 138 mEq/L (ref 135–145)

## 2020-04-11 LAB — LIPID PANEL
Cholesterol: 201 mg/dL — ABNORMAL HIGH (ref 0–200)
HDL: 74.6 mg/dL (ref >39.00)
LDL Cholesterol: 116 mg/dL — ABNORMAL HIGH (ref 0–99)
NonHDL: 126.6
Total CHOL/HDL Ratio: 3
Triglycerides: 53 mg/dL (ref 0.0–149.0)
VLDL: 10.6 mg/dL (ref 0.0–40.0)

## 2020-04-11 LAB — HEPATIC FUNCTION PANEL
ALT: 13 U/L (ref 0–35)
AST: 20 U/L (ref 0–37)
Albumin: 4.3 g/dL (ref 3.5–5.2)
Alkaline Phosphatase: 34 U/L — ABNORMAL LOW (ref 39–117)
Bilirubin, Direct: 0.1 mg/dL (ref 0.0–0.3)
Total Bilirubin: 0.4 mg/dL (ref 0.2–1.2)
Total Protein: 7.4 g/dL (ref 6.0–8.3)

## 2020-04-11 LAB — TSH: TSH: 1.9 u[IU]/mL (ref 0.35–4.50)

## 2020-04-11 MED ORDER — SHINGRIX 50 MCG/0.5ML IM SUSR: 0.5000 mL | Freq: Once | INTRAMUSCULAR | 1 refills | Status: AC

## 2020-04-11 MED ORDER — ALBUTEROL SULFATE HFA 108 (90 BASE) MCG/ACT IN AERS: INHALATION_SPRAY | RESPIRATORY_TRACT | 3 refills | Status: DC

## 2020-04-11 MED ORDER — BUDESONIDE 180 MCG/ACT IN AEPB: 1.0000 | INHALATION_SPRAY | Freq: Four times a day (QID) | RESPIRATORY_TRACT | 3 refills | Status: DC | PRN

## 2020-04-11 NOTE — Patient Instructions (Addendum)
Health Maintenance, Female Adopting a healthy lifestyle and getting preventive care are important in promoting health and wellness. Ask your health care provider about:  The right schedule for you to have regular tests and exams.  Things you can do on your own to prevent diseases and keep yourself healthy. What should I know about diet, weight, and exercise? Eat a healthy diet   Eat a diet that includes plenty of vegetables, fruits, low-fat dairy products, and lean protein.  Do not eat a lot of foods that are high in solid fats, added sugars, or sodium. Maintain a healthy weight Body mass index (BMI) is used to identify weight problems. It estimates body fat based on height and weight. Your health care provider can help determine your BMI and help you achieve or maintain a healthy weight. Get regular exercise Get regular exercise. This is one of the most important things you can do for your health. Most adults should:  Exercise for at least 150 minutes each week. The exercise should increase your heart rate and make you sweat (moderate-intensity exercise).  Do strengthening exercises at least twice a week. This is in addition to the moderate-intensity exercise.  Spend less time sitting. Even light physical activity can be beneficial. Watch cholesterol and blood lipids Have your blood tested for lipids and cholesterol at 71 years of age, then have this test every 5 years. Have your cholesterol levels checked more often if:  Your lipid or cholesterol levels are high.  You are older than 71 years of age.  You are at high risk for heart disease. What should I know about cancer screening? Depending on your health history and family history, you may need to have cancer screening at various ages. This may include screening for:  Breast cancer.  Cervical cancer.  Colorectal cancer.  Skin cancer.  Lung cancer. What should I know about heart disease, diabetes, and high blood  pressure? Blood pressure and heart disease  High blood pressure causes heart disease and increases the risk of stroke. This is more likely to develop in people who have high blood pressure readings, are of African descent, or are overweight.  Have your blood pressure checked: ? Every 3-5 years if you are 18-39 years of age. ? Every year if you are 40 years old or older. Diabetes Have regular diabetes screenings. This checks your fasting blood sugar level. Have the screening done:  Once every three years after age 40 if you are at a normal weight and have a low risk for diabetes.  More often and at a younger age if you are overweight or have a high risk for diabetes. What should I know about preventing infection? Hepatitis B If you have a higher risk for hepatitis B, you should be screened for this virus. Talk with your health care provider to find out if you are at risk for hepatitis B infection. Hepatitis C Testing is recommended for:  Everyone born from 1945 through 1965.  Anyone with known risk factors for hepatitis C. Sexually transmitted infections (STIs)  Get screened for STIs, including gonorrhea and chlamydia, if: ? You are sexually active and are younger than 71 years of age. ? You are older than 71 years of age and your health care provider tells you that you are at risk for this type of infection. ? Your sexual activity has changed since you were last screened, and you are at increased risk for chlamydia or gonorrhea. Ask your health care provider if   you are at risk.  Ask your health care provider about whether you are at high risk for HIV. Your health care provider may recommend a prescription medicine to help prevent HIV infection. If you choose to take medicine to prevent HIV, you should first get tested for HIV. You should then be tested every 3 months for as long as you are taking the medicine. Pregnancy  If you are about to stop having your period (premenopausal) and  you may become pregnant, seek counseling before you get pregnant.  Take 400 to 800 micrograms (mcg) of folic acid every day if you become pregnant.  Ask for birth control (contraception) if you want to prevent pregnancy. Osteoporosis and menopause Osteoporosis is a disease in which the bones lose minerals and strength with aging. This can result in bone fractures. If you are 74 years old or older, or if you are at risk for osteoporosis and fractures, ask your health care provider if you should:  Be screened for bone loss.  Take a calcium or vitamin D supplement to lower your risk of fractures.  Be given hormone replacement therapy (HRT) to treat symptoms of menopause. Follow these instructions at home: Lifestyle  Do not use any products that contain nicotine or tobacco, such as cigarettes, e-cigarettes, and chewing tobacco. If you need help quitting, ask your health care provider.  Do not use street drugs.  Do not share needles.  Ask your health care provider for help if you need support or information about quitting drugs. Alcohol use  Do not drink alcohol if: ? Your health care provider tells you not to drink. ? You are pregnant, may be pregnant, or are planning to become pregnant.  If you drink alcohol: ? Limit how much you use to 0-1 drink a day. ? Limit intake if you are breastfeeding.  Be aware of how much alcohol is in your drink. In the [place] one drink equals one 12 oz bottle of beer (355 mL), one 5 oz glass of wine (148 mL), or one 1 oz glass of hard liquor (44 mL). General instructions  Schedule regular health, dental, and eye exams.  Stay current with your vaccines.  Tell your health care provider if: ? You often feel depressed. ? You have ever been abused or do not feel safe at home. Summary  Adopting a healthy lifestyle and getting preventive care are important in promoting health and wellness.  Follow your health care provider's instructions about healthy  diet, exercising, and getting tested or screened for diseases.  Follow your health care provider's instructions on monitoring your cholesterol and blood pressure. This information is not intended to replace advice given to you by your health care provider. Make sure you discuss any questions you have with your health care provider. Document Revised: 06/02/2018 Document Reviewed: 06/02/2018 Elsevier Patient Education  2020 [place] Maintenance, Female Adopting a healthy lifestyle and getting preventive care are important in promoting health and wellness. Ask your health care provider about:  The right schedule for you to have regular tests and exams.  Things you can do on your own to prevent diseases and keep yourself healthy. What should I know about diet, weight, and exercise? Eat a healthy diet   Eat a diet that includes plenty of vegetables, fruits, low-fat dairy products, and lean protein.  Do not eat a lot of foods that are high in solid fats, added sugars, or sodium. Maintain a healthy weight Body mass index (BMI) is  used to identify weight problems. It estimates body fat based on height and weight. Your health care provider can help determine your BMI and help you achieve or maintain a healthy weight. Get regular exercise Get regular exercise. This is one of the most important things you can do for your health. Most adults should:  Exercise for at least 150 minutes each week. The exercise should increase your heart rate and make you sweat (moderate-intensity exercise).  Do strengthening exercises at least twice a week. This is in addition to the moderate-intensity exercise.  Spend less time sitting. Even light physical activity can be beneficial. Watch cholesterol and blood lipids Have your blood tested for lipids and cholesterol at 69 years of age, then have this test every 5 years. Have your cholesterol levels checked more often if:  Your lipid or cholesterol  levels are high.  You are older than 71 years of age.  You are at high risk for heart disease. What should I know about cancer screening? Depending on your health history and family history, you may need to have cancer screening at various ages. This may include screening for:  Breast cancer.  Cervical cancer.  Colorectal cancer.  Skin cancer.  Lung cancer. What should I know about heart disease, diabetes, and high blood pressure? Blood pressure and heart disease  High blood pressure causes heart disease and increases the risk of stroke. This is more likely to develop in people who have high blood pressure readings, are of African descent, or are overweight.  Have your blood pressure checked: ? Every 3-5 years if you are 78-64 years of age. ? Every year if you are 25 years old or older. Diabetes Have regular diabetes screenings. This checks your fasting blood sugar level. Have the screening done:  Once every three years after age 7 if you are at a normal weight and have a low risk for diabetes.  More often and at a younger age if you are overweight or have a high risk for diabetes. What should I know about preventing infection? Hepatitis B If you have a higher risk for hepatitis B, you should be screened for this virus. Talk with your health care provider to find out if you are at risk for hepatitis B infection. Hepatitis C Testing is recommended for:  Everyone born from [birth year] through 1965.  Anyone with known risk factors for hepatitis C. Sexually transmitted infections (STIs)  Get screened for STIs, including gonorrhea and chlamydia, if: ? You are sexually active and are younger than 71 years of age. ? You are older than 71 years of age and your health care provider tells you that you are at risk for this type of infection. ? Your sexual activity has changed since you were last screened, and you are at increased risk for chlamydia or gonorrhea. Ask your health care  provider if you are at risk.  Ask your health care provider about whether you are at high risk for HIV. Your health care provider may recommend a prescription medicine to help prevent HIV infection. If you choose to take medicine to prevent HIV, you should first get tested for HIV. You should then be tested every 3 months for as long as you are taking the medicine. Pregnancy  If you are about to stop having your period (premenopausal) and you may become pregnant, seek counseling before you get pregnant.  Take 400 to 800 micrograms (mcg) of folic acid every day if you become pregnant.  Ask  for birth control (contraception) if you want to prevent pregnancy. Osteoporosis and menopause Osteoporosis is a disease in which the bones lose minerals and strength with aging. This can result in bone fractures. If you are 36 years old or older, or if you are at risk for osteoporosis and fractures, ask your health care provider if you should:  Be screened for bone loss.  Take a calcium or vitamin D supplement to lower your risk of fractures.  Be given hormone replacement therapy (HRT) to treat symptoms of menopause. Follow these instructions at home: Lifestyle  Do not use any products that contain nicotine or tobacco, such as cigarettes, e-cigarettes, and chewing tobacco. If you need help quitting, ask your health care provider.  Do not use street drugs.  Do not share needles.  Ask your health care provider for help if you need support or information about quitting drugs. Alcohol use  Do not drink alcohol if: ? Your health care provider tells you not to drink. ? You are pregnant, may be pregnant, or are planning to become pregnant.  If you drink alcohol: ? Limit how much you use to 0-1 drink a day. ? Limit intake if you are breastfeeding.  Be aware of how much alcohol is in your drink. In the [place] one drink equals one 12 oz bottle of beer (355 mL), one 5 oz glass of wine (148 mL), or one 1  oz glass of hard liquor (44 mL). General instructions  Schedule regular health, dental, and eye exams.  Stay current with your vaccines.  Tell your health care provider if: ? You often feel depressed. ? You have ever been abused or do not feel safe at home. Summary  Adopting a healthy lifestyle and getting preventive care are important in promoting health and wellness.  Follow your health care provider's instructions about healthy diet, exercising, and getting tested or screened for diseases.  Follow your health care provider's instructions on monitoring your cholesterol and blood pressure. This information is not intended to replace advice given to you by your health care provider. Make sure you discuss any questions you have with your health care provider. Document Revised: 06/02/2018 Document Reviewed: 06/02/2018 Elsevier Patient Education  2020 [employer].

## 2020-04-11 NOTE — Progress Notes (Signed)
Patient consent obtained. Irrigation with water and peroxide performed. Full view of tympanic membranes after procedure.  Patient tolerated procedure well.   

## 2020-04-11 NOTE — Progress Notes (Signed)
Subjective:  Patient ID: Jessica Thompson, female    DOB: 10-05-1950  Age: 69 y.o. MRN: [id number]  CC: Annual Exam, Anemia, Hyperlipidemia, and Asthma  This visit occurred during the SARS-CoV-2 public health emergency.  Safety protocols were in place, including screening questions prior to the visit, additional usage of staff PPE, and extensive cleaning of exam room while observing appropriate contact time as indicated for disinfecting solutions.    HPI SHONICE WRISLEY presents for a CPX.  She complains of a 1 year history of decreased level of hearing more so on the right than the left.  She denies ear pain, discharge, dizziness, lightheadedness, headache, or blurred vision.  She also needs a refill on her SABA/ICS inhaler.  She has not used them today.  She has rare nonproductive cough and wheezing.  She denies chest pain, hemoptysis, fever, chills, or weight loss.  She has thalassemia trait and is chronically anemic.  She does not experience shortness of breath, fatigue, or paresthesias.   Outpatient Medications Prior to Visit  Medication Sig Dispense Refill  . busPIRone (BUSPAR) 5 MG tablet Take 1 tablet (5 mg total) by mouth 2 (two) times daily. 60 tablet 11  . Calcium-Vitamin D (CALTRATE 600 PLUS-VIT D PO) Take 1 tablet by mouth daily. Reported on 06/26/2015    . guaiFENesin (MUCINEX) 600 MG 12 hr tablet Take by mouth 2 (two) times daily.    Marland Kitchen loratadine (CLARITIN) 10 MG tablet Take 10 mg by mouth 2 (two) times daily.    . Multiple Vitamins-Minerals (CENTRUM SILVER ADULT 50+ PO) Take 1 tablet by mouth daily.    . Omega-3 Fatty Acids (OMEGA 3 PO) Take by mouth daily.    Marland Kitchen triamcinolone (NASACORT) 55 MCG/ACT AERO nasal inhaler Place 2 sprays into the nose daily. Reported on 06/26/2015    . budesonide (PULMICORT) 180 MCG/ACT inhaler Inhale 1 puff into the lungs every 6 (six) hours as needed (wheezing). 3 each 3  . VENTOLIN HFA 108 (90 Base) MCG/ACT inhaler INHALE 2 PUFFS BY MOUTH  EVERY 6 HOURS AS NEEDED FOR WHEEZE OR SHORTNESS OF BREATH 18 g 3   No facility-administered medications prior to visit.    ROS Review of Systems  Constitutional: Negative for appetite change, chills, diaphoresis, fatigue and fever.  HENT: Positive for hearing loss. Negative for congestion, ear discharge, ear pain, sinus pressure, sore throat, tinnitus, trouble swallowing and voice change.   Respiratory: Positive for cough and wheezing. Negative for shortness of breath.        Rare NP cough  Cardiovascular: Negative for chest pain, palpitations and leg swelling.  Gastrointestinal: Negative for abdominal pain, blood in stool, constipation, diarrhea, nausea and vomiting.  Endocrine: Negative.   Genitourinary: Negative.  Negative for decreased urine volume, difficulty urinating, dysuria, hematuria and urgency.  Musculoskeletal: Negative.  Negative for arthralgias, joint swelling and myalgias.  Skin: Negative.  Negative for color change and pallor.  Neurological: Negative.  Negative for dizziness, weakness, light-headedness, numbness and headaches.  Hematological: Negative for adenopathy. Does not bruise/bleed easily.  Psychiatric/Behavioral: Negative.     Objective:  BP 128/74   Pulse 83   Temp 98.4 F (36.9 C) (Oral)   Ht 5' 3.8" (1.621 m)   Wt 133 lb (60.3 kg)   SpO2 96%   BMI 22.97 kg/m   BP Readings from Last 3 Encounters:  04/11/20 128/74  04/07/19 130/86  09/02/18 138/70    Wt Readings from Last 3 Encounters:  04/11/20 133 lb (60.3  kg)  04/07/19 146 lb (66.2 kg)  09/02/18 156 lb (70.8 kg)    Physical Exam Vitals reviewed.  Constitutional:      Appearance: Normal appearance.  HENT:     Right Ear: Tympanic membrane and external ear normal. Decreased hearing noted. No swelling. There is impacted cerumen. No foreign body. Tympanic membrane is not injected.     Left Ear: Tympanic membrane and external ear normal. Decreased hearing noted. No swelling. There is impacted  cerumen. No foreign body. Tympanic membrane is not injected.     Ears:     Comments: I put Colace in both ears and then irrigated them using water and an ear pick.  The cerumen was removed.  She tolerated the procedure well.  Examination of the TM and EAC afterwards is normal.    Nose: Nose normal.     Mouth/Throat:     Mouth: Mucous membranes are moist.  Eyes:     General: No scleral icterus.    Conjunctiva/sclera: Conjunctivae normal.  Cardiovascular:     Rate and Rhythm: Normal rate and regular rhythm.     Heart sounds: No murmur heard.   Pulmonary:     Effort: Pulmonary effort is normal. No tachypnea.     Breath sounds: No stridor or decreased air movement. Examination of the right-upper field reveals rhonchi. Examination of the right-middle field reveals rhonchi. Examination of the right-lower field reveals rhonchi. Rhonchi present. No decreased breath sounds, wheezing or rales.     Comments: R exp rhonchi Abdominal:     General: Abdomen is flat.     Palpations: There is no mass.     Tenderness: There is no abdominal tenderness. There is no guarding.  Musculoskeletal:        General: Normal range of motion.     Cervical back: Neck supple.     Right lower leg: No edema.     Left lower leg: No edema.  Lymphadenopathy:     Cervical: No cervical adenopathy.  Skin:    General: Skin is warm and dry.     Coloration: Skin is not pale.  Neurological:     General: No focal deficit present.     Mental Status: She is alert and oriented to person, place, and time. Mental status is at baseline.  Psychiatric:        Mood and Affect: Mood normal.        Behavior: Behavior normal.     Lab Results  Component Value Date   WBC 5.2 04/11/2020   HGB 9.3 Repeated and verified X2. (L) 04/11/2020   HCT 29.9 (L) 04/11/2020   PLT 218.0 04/11/2020   GLUCOSE 78 04/11/2020   CHOL 201 (H) 04/11/2020   TRIG 53.0 04/11/2020   HDL 74.60 04/11/2020   LDLCALC 116 (H) 04/11/2020   ALT 13  04/11/2020   AST 20 04/11/2020   NA 138 04/11/2020   K 4.7 04/11/2020   CL 103 04/11/2020   CREATININE 0.93 04/11/2020   BUN 17 04/11/2020   CO2 33 (H) 04/11/2020   TSH 1.90 04/11/2020    No results found.  Assessment & Plan:   Elberta was seen today for annual exam, anemia, hyperlipidemia and asthma.  Diagnoses and all orders for this visit:  Thalassemia trait- Her H&H are stable. -     CBC with Differential/Platelet; Future -     Basic metabolic panel; Future -     Basic metabolic panel -     CBC with  Differential/Platelet  Routine general medical examination at a health care facility- Exam completed, labs reviewed, vaccines reviewed and updated, cancer screenings addressed, patient education was given.  Mild intermittent asthma without complication -     albuterol (VENTOLIN HFA) 108 (90 Base) MCG/ACT inhaler; INHALE 2 PUFFS BY MOUTH EVERY 6 HOURS AS NEEDED FOR WHEEZE OR SHORTNESS OF BREATH -     budesonide (PULMICORT) 180 MCG/ACT inhaler; Inhale 1 puff into the lungs every 6 (six) hours as needed (wheezing). -     Basic metabolic panel; Future -     Basic metabolic panel  Visit for screening mammogram -     MM DIGITAL SCREENING BILATERAL; Future  Hyperlipidemia with target LDL less than 130- She has a mildly elevated ASCVD risk score.  I recommended that she take a statin for CV risk reduction. -     Basic metabolic panel; Future -     Lipid panel; Future -     TSH; Future -     Hepatic function panel; Future -     Hepatic function panel -     TSH -     Lipid panel -     Basic metabolic panel -     rosuvastatin (CRESTOR) 5 MG tablet; Take 1 tablet (5 mg total) by mouth daily.  Bilateral hearing loss due to cerumen impaction- Her hearing returned to normal after the cerumen was removed.  Need for shingles vaccine -     Zoster Vaccine Adjuvanted [facility]) injection; Inject 0.5 mLs into the muscle once for 1 dose.  Other orders -     Pneumococcal polysaccharide  vaccine 23-valent greater than or equal to 2yo subcutaneous/IM   I have changed Derenda Mis. Konen's Ventolin HFA to albuterol. I am also having her start on Shingrix and rosuvastatin. Additionally, I am having her maintain her loratadine, triamcinolone, Multiple Vitamins-Minerals (CENTRUM SILVER ADULT 50+ PO), Calcium-Vitamin D (CALTRATE 600 PLUS-VIT D PO), Omega-3 Fatty Acids (OMEGA 3 PO), guaiFENesin, busPIRone, and budesonide.  Meds ordered this encounter  Medications  . albuterol (VENTOLIN HFA) 108 (90 Base) MCG/ACT inhaler    Sig: INHALE 2 PUFFS BY MOUTH EVERY 6 HOURS AS NEEDED FOR WHEEZE OR SHORTNESS OF BREATH    Dispense:  18 g    Refill:  3  . budesonide (PULMICORT) 180 MCG/ACT inhaler    Sig: Inhale 1 puff into the lungs every 6 (six) hours as needed (wheezing).    Dispense:  3 each    Refill:  3  . Zoster Vaccine Adjuvanted [facility]) injection    Sig: Inject 0.5 mLs into the muscle once for 1 dose.    Dispense:  0.5 mL    Refill:  1  . rosuvastatin (CRESTOR) 5 MG tablet    Sig: Take 1 tablet (5 mg total) by mouth daily.    Dispense:  90 tablet    Refill:  1     Follow-up: Return in about 6 months (around 10/10/2020).  Scarlette Calico, MD

## 2020-04-12 LAB — CBC WITH DIFFERENTIAL/PLATELET
Basophils Relative: 0 % (ref 0.0–3.0)
Eosinophils Relative: 10 % — ABNORMAL HIGH (ref 0.0–5.0)
HCT: 29.9 % — ABNORMAL LOW (ref 36.0–46.0)
Hemoglobin: 9.3 g/dL — ABNORMAL LOW (ref 12.0–15.0)
Lymphocytes Relative: 23 % (ref 12.0–46.0)
MCHC: 31 g/dL (ref 30.0–36.0)
MCV: 70.4 fl — ABNORMAL LOW (ref 78.0–100.0)
Monocytes Relative: 8 % (ref 3.0–12.0)
Neutrophils Relative %: 59 % (ref 43.0–77.0)
Platelets: 218 10*3/uL (ref 150.0–400.0)
RBC: 4.25 Mil/uL (ref 3.87–5.11)
RDW: 16.6 % — ABNORMAL HIGH (ref 11.5–15.5)
WBC: 5.2 10*3/uL (ref 4.0–10.5)

## 2020-04-12 MED ORDER — ROSUVASTATIN CALCIUM 5 MG PO TABS: 5.0000 mg | ORAL_TABLET | Freq: Every day | ORAL | 1 refills | Status: DC

## 2020-04-14 ENCOUNTER — Encounter: Payer: Self-pay | Admitting: Internal Medicine

## 2020-04-17 ENCOUNTER — Telehealth: Payer: Self-pay | Admitting: Internal Medicine

## 2020-04-17 ENCOUNTER — Other Ambulatory Visit: Payer: Self-pay | Admitting: Internal Medicine

## 2020-04-17 DIAGNOSIS — J4520 Mild intermittent asthma, uncomplicated: Secondary | ICD-10-CM

## 2020-04-17 MED ORDER — BUDESONIDE 180 MCG/ACT IN AEPB: 1.0000 | INHALATION_SPRAY | Freq: Four times a day (QID) | RESPIRATORY_TRACT | 3 refills | Status: DC | PRN

## 2020-04-17 MED ORDER — ALBUTEROL SULFATE HFA 108 (90 BASE) MCG/ACT IN AERS: INHALATION_SPRAY | RESPIRATORY_TRACT | 3 refills | Status: DC

## 2020-04-17 NOTE — Telephone Encounter (Signed)
Called pt, LVM.   See result note.  

## 2020-04-17 NOTE — Telephone Encounter (Signed)
albuterol (VENTOLIN HFA) 108 (90 Base) MCG/ACT inhaler budesonide (PULMICORT) 180 MCG/ACT inhaler [id number] - [place], [place] - [address] Phone:  [phone]  Fax:  [phone]     Patient requesting the medications to be sent here

## 2020-04-17 NOTE — Telephone Encounter (Signed)
° ° °  Patient calling to request call to discuss lab results

## 2020-05-22 DIAGNOSIS — R69 Illness, unspecified: Secondary | ICD-10-CM | POA: Diagnosis not present

## 2020-06-08 ENCOUNTER — Ambulatory Visit
Admission: EM | Admit: 2020-06-08 | Discharge: 2020-06-08 | Disposition: A | Discharge status: Home / Self Care | Payer: Medicare HMO | Attending: Urgent Care | Admitting: Urgent Care

## 2020-06-08 ENCOUNTER — Other Ambulatory Visit: Payer: Self-pay

## 2020-06-08 DIAGNOSIS — R0789 Other chest pain: Secondary | ICD-10-CM

## 2020-06-08 DIAGNOSIS — S0181XA Laceration without foreign body of other part of head, initial encounter: Secondary | ICD-10-CM

## 2020-06-08 DIAGNOSIS — Z23 Encounter for immunization: Secondary | ICD-10-CM | POA: Diagnosis not present

## 2020-06-08 DIAGNOSIS — R519 Headache, unspecified: Secondary | ICD-10-CM | POA: Diagnosis not present

## 2020-06-08 MED ORDER — TETANUS-DIPHTH-ACELL PERTUSSIS 5-2.5-18.5 LF-MCG/0.5 IM SUSY
0.5000 mL | PREFILLED_SYRINGE | Freq: Once | INTRAMUSCULAR | Status: AC
  Administered 2020-06-08: 0.5 mL via INTRAMUSCULAR

## 2020-06-08 NOTE — ED Triage Notes (Signed)
Pt states tripped over her dog this am and fell forward and hit chin on carpet. Laceration to chin with active bleeding. C/o soreness to lt side. Denies LOC or hitting her head.

## 2020-06-08 NOTE — Discharge Instructions (Addendum)
Do not use any nonsteroidal anti-inflammatories (NSAIDs) like ibuprofen, Motrin, naproxen, Aleve, etc. which are all available over-the-counter.  Please just use Tylenol at a dose of 500mg -650mg  once every 6 hours as needed for your aches, pains, fevers.   WOUND CARE Please return in 7 days to have your stitches/staples removed or sooner if you have concerns.  Keep area clean and dry for 24 hours. Do not remove bandage, if applied.  After 24 hours, remove bandage and wash wound gently with mild soap and warm water. Reapply a new bandage after cleaning wound, if directed.  Continue daily cleansing with soap and water until stitches/staples are removed.  Do not apply any ointments or creams to the wound while stitches/staples are in place, as this may cause delayed healing.  Notify the office if you experience any of the following signs of infection: Swelling, redness, pus drainage, streaking, fever >101.0 F  Notify the office if you experience excessive bleeding that does not stop after 15-20 minutes of constant, firm pressure.

## 2020-06-08 NOTE — ED Provider Notes (Signed)
[place]   MRN: [id number] DOB: [birth year]  Subjective:   Jessica Thompson is a 69 y.o. female presenting for accidental fall this morning from tripping over her dog.  Patient states that she fell forward and hit her chin against the carpet.  She cleaned her wound, covered it up with Band-Aids and came to our clinic.  Cannot recall her last Tdap and needs this updated.  Patient also has some left-sided soreness, reports midsternal chest pain.  Denies difficulty breathing, history of heart attack, heart disease.  No current facility-administered medications for this encounter.  Current Outpatient Medications:  .  albuterol (VENTOLIN HFA) 108 (90 Base) MCG/ACT inhaler, INHALE 2 PUFFS BY MOUTH EVERY 6 HOURS AS NEEDED FOR WHEEZE OR SHORTNESS OF BREATH, Disp: 18 g, Rfl: 3 .  budesonide (PULMICORT) 180 MCG/ACT inhaler, Inhale 1 puff into the lungs every 6 (six) hours as needed (wheezing)., Disp: 3 each, Rfl: 3 .  busPIRone (BUSPAR) 5 MG tablet, Take 1 tablet (5 mg total) by mouth 2 (two) times daily., Disp: 60 tablet, Rfl: 11 .  Calcium-Vitamin D (CALTRATE 600 PLUS-VIT D PO), Take 1 tablet by mouth daily. Reported on 06/26/2015, Disp: , Rfl:  .  guaiFENesin (MUCINEX) 600 MG 12 hr tablet, Take by mouth 2 (two) times daily., Disp: , Rfl:  .  loratadine (CLARITIN) 10 MG tablet, Take 10 mg by mouth 2 (two) times daily., Disp: , Rfl:  .  Multiple Vitamins-Minerals (CENTRUM SILVER ADULT 50+ PO), Take 1 tablet by mouth daily., Disp: , Rfl:  .  Omega-3 Fatty Acids (OMEGA 3 PO), Take by mouth daily., Disp: , Rfl:  .  rosuvastatin (CRESTOR) 5 MG tablet, Take 1 tablet (5 mg total) by mouth daily., Disp: 90 tablet, Rfl: 1 .  triamcinolone (NASACORT) 55 MCG/ACT AERO nasal inhaler, Place 2 sprays into the nose daily. Reported on 06/26/2015, Disp: , Rfl:    Allergies  Allergen Reactions  . Cabbage Other (See Comments)    congestion  . Cheese Other (See Comments)    congestion  .  Corn-Containing Products Other (See Comments)    congestion  . Erythromycin Other (See Comments)    Abdominal pain  . Peanut-Containing Drug Products Other (See Comments)    congestion  . Soy Allergy Other (See Comments)    congestion  . Wheat Bran Other (See Comments)    congestion  . Mushroom Extract Complex Hives, Rash and Other (See Comments)    Congestion     Past Medical History:  Diagnosis Date  . Allergy   . Anemia    Betathalasemia minor  . Asthma   . Sinusitis      Past Surgical History:  Procedure Laterality Date  . POLYPECTOMY    . sinus sugery      Family History  Problem Relation Age of Onset  . Hyperlipidemia Mother   . Anemia Mother   . Hypertension Father   . Emphysema Father   . Heart disease Brother   . Liver disease Brother     Social History   Tobacco Use  . Smoking status: Never Smoker  . Smokeless tobacco: Never Used  Vaping Use  . Vaping Use: Never used  Substance Use Topics  . Alcohol use: No  . Drug use: No    ROS   Objective:   Vitals: BP 137/74 (BP Location: Left Arm)   Pulse 77   Temp 97.9 F (36.6 C) (Oral)   Resp 18   SpO2 97%  Physical Exam Constitutional:      General: She is not in acute distress.    Appearance: Normal appearance. She is well-developed. She is not ill-appearing, toxic-appearing or diaphoretic.  HENT:     Head: Normocephalic and atraumatic.      Right Ear: Tympanic membrane and ear canal normal. No drainage or tenderness. No middle ear effusion. Tympanic membrane is not erythematous.     Left Ear: Tympanic membrane and ear canal normal. No drainage or tenderness.  No middle ear effusion. Tympanic membrane is not erythematous.     Nose: Nose normal. No congestion or rhinorrhea.     Mouth/Throat:     Mouth: Mucous membranes are moist. No oral lesions.     Pharynx: No pharyngeal swelling, oropharyngeal exudate, posterior oropharyngeal erythema or uvula swelling.     Tonsils: No tonsillar  exudate or tonsillar abscesses.  Eyes:     Extraocular Movements: Extraocular movements intact.     Right eye: Normal extraocular motion.     Left eye: Normal extraocular motion.     Conjunctiva/sclera: Conjunctivae normal.     Pupils: Pupils are equal, round, and reactive to light.  Cardiovascular:     Rate and Rhythm: Normal rate and regular rhythm.     Pulses: Normal pulses.     Heart sounds: Normal heart sounds. No murmur heard. No friction rub. No gallop.   Pulmonary:     Effort: Pulmonary effort is normal. No respiratory distress.     Breath sounds: Normal breath sounds. No stridor. No wheezing, rhonchi or rales.  Musculoskeletal:     Cervical back: Normal range of motion and neck supple.  Lymphadenopathy:     Cervical: No cervical adenopathy.  Skin:    General: Skin is warm and dry.     Findings: No rash.  Neurological:     General: No focal deficit present.     Mental Status: She is alert and oriented to person, place, and time.     Cranial Nerves: No cranial nerve deficit.     Motor: No weakness.     Coordination: Coordination normal.     Gait: Gait normal.     Deep Tendon Reflexes: Reflexes normal.  Psychiatric:        Mood and Affect: Mood normal.        Behavior: Behavior normal.        Thought Content: Thought content normal.        Judgment: Judgment normal.     ED ECG REPORT   Date: 06/08/2020  EKG Time: 12:52 PM  Rate: 77bpm  Rhythm: normal sinus rhythm,  normal EKG, normal sinus rhythm, unchanged from previous tracings  Axis: Normal  Intervals:none  ST&T Change: None  Narrative Interpretation: Sinus rhythm, no previous EKG for comparison.  No acute findings.  PROCEDURE NOTE: laceration repair Verbal consent obtained from patient.  Local anesthesia with 3cc Lidocaine 2% with epinephrine.  Wound explored for tendon, ligament damage. Wound scrubbed with soap and water and rinsed. Wound closed with #1 5-0 Prolene (simple interrupted) sutures.  Wound  cleansed and dressed.   Assessment and Plan :   PDMP not reviewed this encounter.  1. Facial pain   2. Chin laceration, initial encounter   3. Chest wall pain     Laceration repaired successfully.  Counseled on wound care.  Tdap updated today.  EKG reassuring.  Normal cardio pulmonary exam.  Will defer chest x-ray.  No chest tenderness on exam.  Use Tylenol for pain control.  Counseled patient on potential for adverse effects with medications prescribed/recommended today, ER and return-to-clinic precautions discussed, patient verbalized understanding.    Jaynee Eagles, PA-C 06/08/20 1254

## 2020-06-15 ENCOUNTER — Other Ambulatory Visit: Payer: Self-pay

## 2020-06-15 ENCOUNTER — Ambulatory Visit
Admission: EM | Admit: 2020-06-15 | Discharge: 2020-06-15 | Disposition: A | Discharge status: Home / Self Care | Payer: Medicare HMO

## 2020-06-15 ENCOUNTER — Encounter: Payer: Self-pay | Admitting: Emergency Medicine

## 2020-06-15 NOTE — ED Triage Notes (Signed)
Pt here for suture removal; 1 suture removed from chin; wound healed well

## 2020-08-08 DIAGNOSIS — Z0100 Encounter for examination of eyes and vision without abnormal findings: Secondary | ICD-10-CM | POA: Diagnosis not present

## 2020-08-09 DIAGNOSIS — R030 Elevated blood-pressure reading, without diagnosis of hypertension: Secondary | ICD-10-CM | POA: Diagnosis not present

## 2020-08-09 DIAGNOSIS — J45909 Unspecified asthma, uncomplicated: Secondary | ICD-10-CM | POA: Diagnosis not present

## 2020-08-09 DIAGNOSIS — Z8249 Family history of ischemic heart disease and other diseases of the circulatory system: Secondary | ICD-10-CM | POA: Diagnosis not present

## 2020-08-09 DIAGNOSIS — K5900 Constipation, unspecified: Secondary | ICD-10-CM | POA: Diagnosis not present

## 2020-08-10 DIAGNOSIS — Z0100 Encounter for examination of eyes and vision without abnormal findings: Secondary | ICD-10-CM | POA: Diagnosis not present

## 2020-08-20 ENCOUNTER — Telehealth: Payer: Self-pay | Admitting: Internal Medicine

## 2020-08-20 NOTE — Telephone Encounter (Signed)
LVM for pt to rtn my call to schedule AWV with [facility]. Please schedule appt if pt calls the office.  

## 2020-08-22 ENCOUNTER — Other Ambulatory Visit: Payer: Self-pay

## 2020-08-22 ENCOUNTER — Ambulatory Visit: Payer: Medicare HMO | Admitting: Podiatry

## 2020-08-22 DIAGNOSIS — L600 Ingrowing nail: Secondary | ICD-10-CM | POA: Diagnosis not present

## 2020-08-22 DIAGNOSIS — M79675 Pain in left toe(s): Secondary | ICD-10-CM

## 2020-08-22 DIAGNOSIS — M79674 Pain in right toe(s): Secondary | ICD-10-CM | POA: Diagnosis not present

## 2020-08-22 DIAGNOSIS — B351 Tinea unguium: Secondary | ICD-10-CM

## 2020-08-22 NOTE — Patient Instructions (Signed)

## 2020-08-27 NOTE — Progress Notes (Signed)
Subjective:   Patient ID: Jessica Thompson, female   DOB: 70 y.o.   MRN: [id number]   HPI Patient presents with severely thickened dystrophic nailbeds hallux bilateral that she cannot take care of and are increasingly making it hard to wear shoe gear along with damaged nailbeds 2-5 both feet that are thick yellow and very difficult to cut.  Patient states this is been ongoing she does not smoke likes to be active   Review of Systems  All other systems reviewed and are negative.       Objective:  Physical Exam Vitals and nursing note reviewed.  Constitutional:      Appearance: She is well-developed and well-nourished.  Cardiovascular:     Pulses: Intact distal pulses.  Pulmonary:     Effort: Pulmonary effort is normal.  Musculoskeletal:        General: Normal range of motion.  Skin:    General: Skin is warm.  Neurological:     Mental Status: She is alert.     Neurovascular status was found to be intact with patient noted to have thick yellow brittle nailbeds 1-5 both feet with the hallux nailbeds both feet being dystrophic moderately loose and painful when pressed.  Patient has good digital perfusion well oriented x3     Assessment:  Severe nail disease hallux bilateral with thick brittle nails that are painful with thick yellow brittle nailbeds 2-5 both feet     Plan:  H&P reviewed conditions and for the big toenails I recommended removal and I explained procedure risk.  Patient wants surgery I infiltrated each hallux 60 mg like Marcaine mixture sterile prep done and using sterile instrumentation remove the hallux nail exposed matrix applied phenol 5 applications 30 seconds followed by alcohol lavage sterile dressing.  Gave instructions on soaks leave dressing on 24 hours take off earlier if needed and I debrided nailbeds 2 through 5 bilateral no iatrogenic bleeding reappoint routine care

## 2020-10-10 ENCOUNTER — Encounter: Payer: Self-pay | Admitting: Internal Medicine

## 2020-10-10 ENCOUNTER — Other Ambulatory Visit: Payer: Self-pay

## 2020-10-10 ENCOUNTER — Ambulatory Visit (INDEPENDENT_AMBULATORY_CARE_PROVIDER_SITE_OTHER): Payer: Medicare HMO | Admitting: Internal Medicine

## 2020-10-10 VITALS — BP 136/82 | HR 75 | Temp 97.9°F | Ht 63.8 in | Wt 126.0 lb

## 2020-10-10 DIAGNOSIS — J4520 Mild intermittent asthma, uncomplicated: Secondary | ICD-10-CM | POA: Diagnosis not present

## 2020-10-10 DIAGNOSIS — L2084 Intrinsic (allergic) eczema: Secondary | ICD-10-CM

## 2020-10-10 DIAGNOSIS — D539 Nutritional anemia, unspecified: Secondary | ICD-10-CM

## 2020-10-10 DIAGNOSIS — E785 Hyperlipidemia, unspecified: Secondary | ICD-10-CM | POA: Diagnosis not present

## 2020-10-10 DIAGNOSIS — D563 Thalassemia minor: Secondary | ICD-10-CM | POA: Diagnosis not present

## 2020-10-10 DIAGNOSIS — Z1231 Encounter for screening mammogram for malignant neoplasm of breast: Secondary | ICD-10-CM

## 2020-10-10 LAB — TSH: TSH: 1.75 u[IU]/mL (ref 0.35–4.50)

## 2020-10-10 LAB — CBC WITH DIFFERENTIAL/PLATELET
Basophils Relative: 0 % (ref 0.0–3.0)
Eosinophils Relative: 2 % (ref 0.0–5.0)
HCT: 30 % — ABNORMAL LOW (ref 36.0–46.0)
Hemoglobin: 9.2 g/dL — ABNORMAL LOW (ref 12.0–15.0)
Lymphocytes Relative: 43 % (ref 12.0–46.0)
MCHC: 30.5 g/dL (ref 30.0–36.0)
MCV: 70.5 fl — ABNORMAL LOW (ref 78.0–100.0)
Monocytes Relative: 11 % (ref 3.0–12.0)
Neutrophils Relative %: 44 % (ref 43.0–77.0)
Platelets: 226 10*3/uL (ref 150.0–400.0)
RBC: 4.25 Mil/uL (ref 3.87–5.11)
RDW: 16.9 % — ABNORMAL HIGH (ref 11.5–15.5)
WBC: 4.2 10*3/uL (ref 4.0–10.5)

## 2020-10-10 LAB — LIPID PANEL
Cholesterol: 169 mg/dL (ref 0–200)
HDL: 74.9 mg/dL (ref >39.00)
LDL Cholesterol: 87 mg/dL (ref 0–99)
NonHDL: 93.79
Total CHOL/HDL Ratio: 2
Triglycerides: 34 mg/dL (ref 0.0–149.0)
VLDL: 6.8 mg/dL (ref 0.0–40.0)

## 2020-10-10 LAB — VITAMIN B12: Vitamin B-12: 1087 pg/mL — ABNORMAL HIGH (ref 211–911)

## 2020-10-10 LAB — FERRITIN: Ferritin: 53.1 ng/mL (ref 10.0–291.0)

## 2020-10-10 LAB — FOLATE: Folate: >23.6 ng/mL (ref >5.9)

## 2020-10-10 LAB — IRON: Iron: 60 ug/dL (ref 42–145)

## 2020-10-10 MED ORDER — ALBUTEROL SULFATE HFA 108 (90 BASE) MCG/ACT IN AERS: INHALATION_SPRAY | RESPIRATORY_TRACT | 3 refills | Status: DC

## 2020-10-10 MED ORDER — BUDESONIDE 180 MCG/ACT IN AEPB: 1.0000 | INHALATION_SPRAY | Freq: Four times a day (QID) | RESPIRATORY_TRACT | 3 refills | Status: DC | PRN

## 2020-10-10 MED ORDER — CLOBETASOL PROPIONATE 0.05 % EX OINT: 1.0000 "application " | TOPICAL_OINTMENT | Freq: Two times a day (BID) | CUTANEOUS | 1 refills | Status: DC

## 2020-10-10 NOTE — Progress Notes (Signed)
Subjective:  Patient ID: Jessica Thompson, female    DOB: 1950-12-21  Age: 70 y.o. MRN: [id number]  CC: Anemia  This visit occurred during the SARS-CoV-2 public health emergency.  Safety protocols were in place, including screening questions prior to the visit, additional usage of staff PPE, and extensive cleaning of exam room while observing appropriate contact time as indicated for disinfecting solutions.    HPI DAYSI BOGGAN presents for f/up -   She complains of a several month history of itchy dark spot over her right hip area.  She has not treated this.  She decided not to take the statin because she read about some side effects.  She otherwise feels well and offers no complaints.  Outpatient Medications Prior to Visit  Medication Sig Dispense Refill  . Calcium-Vitamin D (CALTRATE 600 PLUS-VIT D PO) Take 1 tablet by mouth daily. Reported on 06/26/2015    . guaiFENesin (MUCINEX) 600 MG 12 hr tablet Take by mouth 2 (two) times daily.    Marland Kitchen loratadine (CLARITIN) 10 MG tablet Take 10 mg by mouth 2 (two) times daily.    . Multiple Vitamins-Minerals (CENTRUM SILVER ADULT 50+ PO) Take 1 tablet by mouth daily.    . Omega-3 Fatty Acids (OMEGA 3 PO) Take by mouth daily.    Marland Kitchen triamcinolone (NASACORT) 55 MCG/ACT AERO nasal inhaler Place 2 sprays into the nose daily. Reported on 06/26/2015    . albuterol (VENTOLIN HFA) 108 (90 Base) MCG/ACT inhaler INHALE 2 PUFFS BY MOUTH EVERY 6 HOURS AS NEEDED FOR WHEEZE OR SHORTNESS OF BREATH 18 g 3  . budesonide (PULMICORT) 180 MCG/ACT inhaler Inhale 1 puff into the lungs every 6 (six) hours as needed (wheezing). 3 each 3  . busPIRone (BUSPAR) 5 MG tablet Take 1 tablet (5 mg total) by mouth 2 (two) times daily. 60 tablet 11  . rosuvastatin (CRESTOR) 5 MG tablet Take 1 tablet (5 mg total) by mouth daily. 90 tablet 1   No facility-administered medications prior to visit.    ROS Review of Systems  Constitutional: Negative for chills, diaphoresis,  fatigue and fever.  HENT: Negative.   Eyes: Negative.   Respiratory: Negative for cough, chest tightness, shortness of breath and wheezing.   Cardiovascular: Negative for chest pain, palpitations and leg swelling.  Gastrointestinal: Negative for abdominal pain, constipation, diarrhea, nausea and vomiting.  Endocrine: Negative.   Genitourinary: Negative.  Negative for difficulty urinating.  Musculoskeletal: Negative for arthralgias and myalgias.  Skin: Positive for color change and rash.  Neurological: Negative.  Negative for dizziness, weakness and light-headedness.  Hematological: Negative for adenopathy. Does not bruise/bleed easily.  Psychiatric/Behavioral: Negative.     Objective:  BP 136/82   Pulse 75   Temp 97.9 F (36.6 C) (Oral)   Ht 5' 3.8" (1.621 m)   Wt 126 lb (57.2 kg)   SpO2 99%   BMI 21.76 kg/m   BP Readings from Last 3 Encounters:  10/10/20 136/82  06/08/20 137/74  04/11/20 128/74    Wt Readings from Last 3 Encounters:  10/10/20 126 lb (57.2 kg)  04/11/20 133 lb (60.3 kg)  04/07/19 146 lb (66.2 kg)    Physical Exam Vitals reviewed.  Constitutional:      Appearance: Normal appearance.  HENT:     Nose: Nose normal.     Mouth/Throat:     Mouth: Mucous membranes are moist.  Eyes:     General: No scleral icterus.    Conjunctiva/sclera: Conjunctivae normal.  Cardiovascular:  Rate and Rhythm: Normal rate and regular rhythm.     Heart sounds: No murmur heard.   Pulmonary:     Effort: Pulmonary effort is normal.     Breath sounds: No stridor. No wheezing, rhonchi or rales.  Abdominal:     General: Abdomen is flat.     Palpations: There is no mass.     Tenderness: There is no abdominal tenderness. There is no guarding.  Musculoskeletal:     Cervical back: Neck supple.  Lymphadenopathy:     Cervical: No cervical adenopathy.  Skin:    General: Skin is warm.     Findings: Rash present. No erythema.       Neurological:     General: No focal  deficit present.     Mental Status: She is alert.  Psychiatric:        Mood and Affect: Mood normal.        Behavior: Behavior normal.     Lab Results  Component Value Date   WBC 4.2 10/10/2020   HGB 9.2 (L) 10/10/2020   HCT 30.0 (L) 10/10/2020   PLT 226.0 10/10/2020   GLUCOSE 78 04/11/2020   CHOL 169 10/10/2020   TRIG 34.0 10/10/2020   HDL 74.90 10/10/2020   LDLCALC 87 10/10/2020   ALT 13 04/11/2020   AST 20 04/11/2020   NA 138 04/11/2020   K 4.7 04/11/2020   CL 103 04/11/2020   CREATININE 0.93 04/11/2020   BUN 17 04/11/2020   CO2 33 (H) 04/11/2020   TSH 1.75 10/10/2020    No results found.  Assessment & Plan:   Bettylee was seen today for anemia.  Diagnoses and all orders for this visit:  Thalassemia trait- Her H&H are stable.  I will screen her for vitamin deficiencies. -     CBC with Differential/Platelet; Future -     CBC with Differential/Platelet  Mild intermittent asthma without complication -     albuterol (VENTOLIN HFA) 108 (90 Base) MCG/ACT inhaler; INHALE 2 PUFFS BY MOUTH EVERY 6 HOURS AS NEEDED FOR WHEEZE OR SHORTNESS OF BREATH -     budesonide (PULMICORT) 180 MCG/ACT inhaler; Inhale 1 puff into the lungs every 6 (six) hours as needed (wheezing).  Intrinsic eczema -     clobetasol ointment (TEMOVATE) 0.05 %; Apply 1 application topically 2 (two) times daily.  Hyperlipidemia with target LDL less than 130- She is not willing to take a statin for cardiovascular risk reduction. -     Lipid panel; Future -     TSH; Future -     TSH -     Lipid panel  Deficiency anemia -     CBC with Differential/Platelet; Future -     Vitamin B12; Future -     Iron; Future -     Folate; Future -     Ferritin; Future -     Vitamin B1; Future -     Vitamin B1 -     Ferritin -     Folate -     Iron -     Vitamin B12 -     CBC with Differential/Platelet  Visit for screening mammogram -     MM DIGITAL SCREENING BILATERAL; Future   I have discontinued Derenda Mis. Sivertson's busPIRone and rosuvastatin. I am also having her start on clobetasol ointment. Additionally, I am having her maintain her loratadine, triamcinolone, Multiple Vitamins-Minerals (CENTRUM SILVER ADULT 50+ PO), Calcium-Vitamin D (CALTRATE 600 PLUS-VIT D PO), Omega-3  Fatty Acids (OMEGA 3 PO), guaiFENesin, albuterol, and budesonide.  Meds ordered this encounter  Medications  . albuterol (VENTOLIN HFA) 108 (90 Base) MCG/ACT inhaler    Sig: INHALE 2 PUFFS BY MOUTH EVERY 6 HOURS AS NEEDED FOR WHEEZE OR SHORTNESS OF BREATH    Dispense:  18 g    Refill:  3  . budesonide (PULMICORT) 180 MCG/ACT inhaler    Sig: Inhale 1 puff into the lungs every 6 (six) hours as needed (wheezing).    Dispense:  3 each    Refill:  3  . clobetasol ointment (TEMOVATE) 0.05 %    Sig: Apply 1 application topically 2 (two) times daily.    Dispense:  45 g    Refill:  1     Follow-up: Return in about 3 months (around 01/09/2021).  Scarlette Calico, MD

## 2020-10-10 NOTE — Patient Instructions (Signed)
[employer] medicine (25th ed., pp. [phone]). [place], [place]: Elsevier.">  Anemia  Anemia is a condition in which there is not enough red blood cells or hemoglobin in the blood. Hemoglobin is a substance in red blood cells that carries oxygen. When you do not have enough red blood cells or hemoglobin (are anemic), your body cannot get enough oxygen and your organs may not work properly. As a result, you may feel very tired or have other problems. What are the causes? Common causes of anemia include:  Excessive bleeding. Anemia can be caused by excessive bleeding inside or outside the body, including bleeding from the intestines or from heavy menstrual periods in females.  Poor nutrition.  Long-lasting (chronic) kidney, thyroid, and liver disease.  Bone marrow disorders, spleen problems, and blood disorders.  Cancer and treatments for cancer.  HIV (human immunodeficiency virus) and AIDS (acquired immunodeficiency syndrome).  Infections, medicines, and autoimmune disorders that destroy red blood cells. What are the signs or symptoms? Symptoms of this condition include:  Minor weakness.  Dizziness.  Headache, or difficulties concentrating and sleeping.  Heartbeats that feel irregular or faster than normal (palpitations).  Shortness of breath, especially with exercise.  Pale skin, lips, and nails, or cold hands and feet.  Indigestion and nausea. Symptoms may occur suddenly or develop slowly. If your anemia is mild, you may not have symptoms. How is this diagnosed? This condition is diagnosed based on blood tests, your medical history, and a physical exam. In some cases, a test may be needed in which cells are removed from the soft tissue inside of a bone and looked at under a microscope (bone marrow biopsy). Your health care provider may also check your stool (feces) for blood and may do additional testing to look for the cause of your bleeding. Other tests may  include:  Imaging tests, such as a CT scan or MRI.  A procedure to see inside your esophagus and stomach (endoscopy).  A procedure to see inside your colon and rectum (colonoscopy). How is this treated? Treatment for this condition depends on the cause. If you continue to lose a lot of blood, you may need to be treated at a hospital. Treatment may include:  Taking supplements of iron, vitamin Q68, or folic acid.  Taking a hormone medicine (erythropoietin) that can help to stimulate red blood cell growth.  Having a blood transfusion. This may be needed if you lose a lot of blood.  Making changes to your diet.  Having surgery to remove your spleen. Follow these instructions at home:  Take over-the-counter and prescription medicines only as told by your health care provider.  Take supplements only as told by your health care provider.  Follow any diet instructions that you were given by your health care provider.  Keep all follow-up visits as told by your health care provider. This is important. Contact a health care provider if:  You develop new bleeding anywhere in the body. Get help right away if:  You are very weak.  You are short of breath.  You have pain in your abdomen or chest.  You are dizzy or feel faint.  You have trouble concentrating.  You have bloody stools, black stools, or tarry stools.  You vomit repeatedly or you vomit up blood. These symptoms may represent a serious problem that is an emergency. Do not wait to see if the symptoms will go away. Get medical help right away. Call your local emergency services (911 in the [place] Do not  drive yourself to the hospital. Summary  Anemia is a condition in which you do not have enough red blood cells or enough of a substance in your red blood cells that carries oxygen (hemoglobin).  Symptoms may occur suddenly or develop slowly.  If your anemia is mild, you may not have symptoms.  This condition is  diagnosed with blood tests, a medical history, and a physical exam. Other tests may be needed.  Treatment for this condition depends on the cause of the anemia. This information is not intended to replace advice given to you by your health care provider. Make sure you discuss any questions you have with your health care provider. Document Revised: 05/17/2019 Document Reviewed: 05/17/2019 Elsevier Patient Education  2021 [employer] Inc.  

## 2020-10-12 ENCOUNTER — Encounter: Payer: Self-pay | Admitting: Internal Medicine

## 2020-10-12 ENCOUNTER — Other Ambulatory Visit: Payer: Self-pay | Admitting: Internal Medicine

## 2020-10-14 LAB — VITAMIN B1: Vitamin B1 (Thiamine): 34 nmol/L — ABNORMAL HIGH (ref 8–30)

## 2020-11-09 ENCOUNTER — Telehealth: Payer: Self-pay | Admitting: Internal Medicine

## 2020-11-09 NOTE — Progress Notes (Signed)
  Chronic Care Management   Outreach Note  11/09/2020 Name: Jessica Thompson MRN: [id number] DOB: 06-30-1950  Referred by: Janith Lima, MD Reason for referral : No chief complaint on file.   An unsuccessful telephone outreach was attempted today. The patient was referred to the pharmacist for assistance with care management and care coordination.   Follow Up Plan:   [place]

## 2020-11-22 ENCOUNTER — Telehealth: Payer: Self-pay | Admitting: Internal Medicine

## 2020-11-22 NOTE — Chronic Care Management (AMB) (Signed)
  Chronic Care Management   Note  11/22/2020 Name: Jessica Thompson MRN: [id number] DOB: [birth year]  Jessica Thompson is a 70 y.o. year old female who is a primary care patient of Janith Lima, MD. I reached out to Gearldine Bienenstock by phone today in response to a referral sent by [name]'s PCP, Janith Lima, MD.   [name] was given information about Chronic Care Management services today including:  1. CCM service includes personalized support from designated clinical staff supervised by her physician, including individualized plan of care and coordination with other care providers 2. 24/7 contact phone numbers for assistance for urgent and routine care needs. 3. Service will only be billed when office clinical staff spend 20 minutes or more in a month to coordinate care. 4. Only one practitioner may furnish and bill the service in a calendar month. 5. The patient may stop CCM services at any time (effective at the end of the month) by phone call to the office staff.   Patient agreed to services and verbal consent obtained.   Follow up plan:   Lauretta Grill [employer] Scheduler

## 2021-01-03 NOTE — Progress Notes (Signed)
 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   -Advised for patient to reach out in the future with any issues or concerns about prescribed medications, did not start previously prescribed rosuvastatin or clobetasol ointment due to concern about possible side effects, patient agreeable to reach out for discussion in the future  Subjective: Jessica Thompson is an 70 y.o. year old female who is a primary patient of Jones, Thomas L, MD.  The CCM team was consulted for assistance with disease management and care coordination needs.    Engaged with patient by telephone for initial visit in response to provider referral for pharmacy case management and/or care coordination services.   Consent to Services:  The patient was given the following information about Chronic Care Management services today, agreed to services, and gave verbal consent: 1. CCM service includes personalized support from designated clinical staff supervised by the primary care  provider, including individualized plan of care and coordination with other care providers 2. 24/7 contact phone numbers for assistance for urgent and routine care needs. 3. Service will only be billed when office clinical staff spend 20 minutes or more in a month to coordinate care. 4. Only one practitioner may furnish and bill the service in a calendar month. 5.The patient may stop CCM services at any time (effective at the end of the month) by phone call to the office staff. 6. The patient will be responsible for cost sharing (co-pay) of up to 20% of the service fee (after annual deductible is met). Patient agreed to services and consent obtained.  Patient Care Team: Jones, Thomas L, MD as PCP - General (Internal Medicine) Szabat, Daniel C, RPH as Pharmacist (Pharmacist)  Recent office visits: 10/10/2020 - PCP visit - complains of a several month history of an itchy dark spot over her right hip - decided not to take statin due to possible side effects.  Started on clobetasol, stopped buspirone and rosuvastatin  04/11/2020 - PCP visit - complains of 1 year history of decreased level of hearing - ear irrigation completed in office - recommended shingles vaccine and starting rosuvastatin   Recent consult visits: 08/22/2020 - Dr. Regal - DPM - presents with severely thickened dystrophic nailbeds - recommended removal of big toenails and epsom salt soaks  Hospital visits: 06/08/2020 - presents to ER after a fall (tripped over her dog) - hit chin again carpet - wound cleaned, given suture and wound care counseling   Objective:  Lab Results  Component Value Date   CREATININE 0.93 04/11/2020     BUN 17 04/11/2020   GFR 61.54 04/11/2020   NA 138 04/11/2020   K 4.7 04/11/2020   CALCIUM 9.8 04/11/2020   CO2 33 (H) 04/11/2020   GLUCOSE 78 04/11/2020    Lab Results  Component Value Date/Time   GFR 61.54 04/11/2020 03:25 PM   GFR 67.76 04/07/2019 11:12 AM    Last diabetic Eye exam:  No results  found for: HMDIABEYEEXA  Last diabetic Foot exam:  No results found for: HMDIABFOOTEX   Lab Results  Component Value Date   CHOL 169 10/10/2020   HDL 74.90 10/10/2020   LDLCALC 87 10/10/2020   TRIG 34.0 10/10/2020   CHOLHDL 2 10/10/2020    Hepatic Function Latest Ref Rng & Units 04/11/2020 04/07/2019 02/25/2018  Total Protein 6.0 - 8.3 g/dL 7.4 7.3 7.4  Albumin 3.5 - 5.2 g/dL 4.3 4.2 4.1  AST 0 - 37 U/L 20 19 16  ALT 0 - 35 U/L 13 13 11  Alk Phosphatase 39 - 117 U/L 34(L) 40 50  Total Bilirubin 0.2 - 1.2 mg/dL 0.4 0.5 0.3  Bilirubin, Direct 0.0 - 0.3 mg/dL 0.1 - -    Lab Results  Component Value Date/Time   TSH 1.75 10/10/2020 02:11 PM   TSH 1.90 04/11/2020 03:25 PM    CBC Latest Ref Rng & Units 10/10/2020 04/11/2020 04/07/2019  WBC 4.0 - 10.5 K/uL 4.2 5.2 5.0  Hemoglobin 12.0 - 15.0 g/dL 9.2(L) 9.3 Repeated and verified X2.(L) 9.5(L)  Hematocrit 36.0 - 46.0 % 30.0(L) 29.9(L) 30.2(L)  Platelets 150.0 - 400.0 K/uL 226.0 218.0 221.0    No results found for: VD25OH  Clinical ASCVD: No  The 10-year ASCVD risk score (Goff DC Jr., et al., 2013) is: 13.7%   Values used to calculate the score:     Age: 70 years     Sex: Female     Is Non-Hispanic African American: Yes     Diabetic: No     Tobacco smoker: No     Systolic Blood Pressure: 136 mmHg     Is BP treated: No     HDL Cholesterol: 74.9 mg/dL     Total Cholesterol: 169 mg/dL    Depression screen PHQ 2/9 04/11/2020 03/23/2019 09/02/2018  Decreased Interest 0 0 0  Down, Depressed, Hopeless 0 0 0  PHQ - 2 Score 0 0 0    Social History   Tobacco Use  Smoking Status Never  Smokeless Tobacco Never   BP Readings from Last 3 Encounters:  10/10/20 136/82  06/08/20 137/74  04/11/20 128/74   Pulse Readings from Last 3 Encounters:  10/10/20 75  06/08/20 77  04/11/20 83   Wt Readings from Last 3 Encounters:  10/10/20 126 lb (57.2 kg)  04/11/20 133 lb (60.3 kg)  04/07/19 146 lb (66.2 kg)   BMI Readings from  Last 3 Encounters:  10/10/20 21.76 kg/m  04/11/20 22.97 kg/m  04/07/19 25.39 kg/m    Assessment/Interventions: Review of patient past medical history, allergies, medications, health status, including review of consultants reports, laboratory and other test data, was performed as part of comprehensive evaluation and provision of chronic care management services.   SDOH:  (Social Determinants of Health) assessments and interventions performed: Yes  SDOH Screenings   Alcohol Screen: Not on file  Depression (PHQ2-9): Low Risk    PHQ-2 Score: 0  Financial Resource Strain: Low Risk    Difficulty of Paying Living Expenses: Not very hard  Food Insecurity: Not on file  Housing: Not on   file  Physical Activity: Not on file  Social Connections: Not on file  Stress: Not on file  Tobacco Use: Low Risk    Smoking Tobacco Use: Never   Smokeless Tobacco Use: Never  Transportation Needs: Not on file    CCM Care Plan  Allergies  Allergen Reactions   Cabbage Other (See Comments)    congestion   Cheese Other (See Comments)    congestion   Corn-Containing Products Other (See Comments)    congestion   Erythromycin Other (See Comments)    Abdominal pain   Peanut-Containing Drug Products Other (See Comments)    congestion   Soy Allergy Other (See Comments)    congestion   Wheat Bran Other (See Comments)    congestion   Mushroom Extract Complex Hives, Rash and Other (See Comments)    Congestion     Medications Reviewed Today     Reviewed by Tomasa Blase, [facility] (Pharmacist) on 01/04/21 at [place] List Status: <None>   Medication Order Taking? Sig Documenting Provider Last Dose Status Informant  albuterol (VENTOLIN HFA) 108 (90 Base) MCG/ACT inhaler [id number] Yes INHALE 2 PUFFS BY MOUTH EVERY 6 HOURS AS NEEDED FOR WHEEZE OR SHORTNESS OF Cherly Beach, MD Taking Active   budesonide (PULMICORT) 180 MCG/ACT inhaler [id number] Yes Inhale 1 puff into the lungs every 6 (six)  hours as needed (wheezing). Janith Lima, MD Taking Active   CALCIUM CARB-CHOLECALCIFEROL PO [id number] Yes Take 2 tablets by mouth daily. 683m / 1000 units (per tablet) [provider] Taking Active   Emollient (RA RENEWAL DRY SKIN THERAPY EX) [id number] Apply 1 application topically daily. [provider] Taking Active   guaiFENesin (MUCINEX) 600 MG 12 hr tablet [id number] Take 600 mg by mouth daily as needed. [provider] Taking Active   loratadine (CLARITIN) 10 MG tablet [id number] Take 10 mg by mouth daily. [provider] Taking Active Self  Multiple Vitamins-Minerals (CENTRUM SILVER ADULT 50+ PO) [id number] Take 1 tablet by mouth daily. [provider] Taking Active Self  Omega-3 Fatty Acids (OMEGA 3 PO) [id number] Take 660 mg by mouth daily. [provider] Taking Active   OVER THE COUNTER MEDICATION [id number] 2 tablets in the morning and at bedtime. Phytomega - 1 serving = 2 tablets contains: Vitamin C: 178m Vitamin E: 27m48mhytosterol Esters: 1000m71msh Oil: 500mg33mpha lipoic Acid: 15mg 59m 10 enzyme: 15mg  42mrietary blend: 95mg Pr327mer, Historical, MD Taking Active   sodium chloride (OCEAN) 0.65 % SOLN nasal spray [id number] 1 spray into both nostrils as needed for congestion. [provider] Taking Active   triamcinolone (NASACORT) 55 MCG/ACT AERO nasal inhaler [id number] 2 sprays into the nose daily. Reported on 06/26/2015 [provider] Taking Active Self            Patient Active Problem List   Diagnosis Date Noted   Intrinsic eczema 10/10/2020   Deficiency anemia 10/10/2020   Visit for screening mammogram 04/11/2020   Hyperlipidemia with target LDL less than 130 04/11/2020   Need for shingles vaccine 04/11/2020   Situational stress 03/26/2018   Nasal polyposis 06/26/2015   Mild intermittent asthma 06/26/2015   Routine general medical examination at a  health care facility 05/02/2015   Allergic rhinitis 10/22/2014   Thalassemia trait 10/22/2014    Immunization History  Administered Date(s) Administered   Influenza-Unspecified 02/21/2014, 04/10/2020   PFIZER(Purple Top)SARS-COV-2 Vaccination 08/24/2019,  09/22/2019   Pneumococcal Conjugate-13 05/02/2015   Pneumococcal Polysaccharide-23 10/05/2013, 04/11/2020   Tdap 07/05/2013, 06/08/2020    Conditions to be addressed/monitored:  Hyperlipidemia, Asthma, Osteopenia, and Allergic Rhinitis  Care Plan : CCM Care Plan  Updates made by Tomasa Blase, RPH since 01/04/2021 12:00 AM     Problem: HLD, Osteopenia, Asthma, Allergic Rhinitis   Priority: High  Onset Date: 01/04/2021     Long-Range Goal: Disease Management   Start Date: 01/04/2021  Expected End Date: 07/07/2021  This Visit's Progress: On track  Priority: High  Note:   Current Barriers:  Unable to independently monitor therapeutic efficacy Does not contact provider office for questions/concerns  Pharmacist Clinical Goal(s):  Patient will achieve adherence to monitoring guidelines and medication adherence to achieve therapeutic efficacy maintain control of LDL as evidenced by next lipid panel  contact provider office for questions/concerns as evidenced notation of same in electronic health record through collaboration with PharmD and provider.   Interventions: 1:1 collaboration with Janith Lima, MD regarding development and update of comprehensive plan of care as evidenced by provider attestation and co-signature Inter-disciplinary care team collaboration (see longitudinal plan of care) Comprehensive medication review performed; medication list updated in electronic medical record  Hyperlipidemia: (LDL goal < 130) -Controlled -Last LDL 19m/dL (10/10/2020) -Current treatment: Omega 3 Fish oils 6632m- 1 capsule daily  Phytomega - 2 tablets twice daily  -Medications previously tried: n/a prescribed rosuvastatin but  never started   -Current dietary patterns: notes to major change in diet since last visit, cooking many of her own meals at this time, reduced amount of sweet, fried, and fatty foods that she had previously been eating  -Current exercise habits: reports to daily activity working around her home  -Educated on Cholesterol goals;  Importance of limiting foods high in cholesterol; Exercise goal of 150 minutes per week; -Recommended to continue current medication  Asthma(Goal: control symptoms and prevent exacerbations) -Controlled -Current treatment  Pulmicort 180 mcg/act - 1 puff every 6 hours as needed (using 1-2 times weekly) Ventolin HFA 10862mact - 2 puffs every 6 hours as needed (using 1-2 times weekly) - Noted that patient can only take Brand medications  -Medications previously tried: arnuity  -Exacerbations requiring treatment in last 6 months: n/a - reports that much of her asthma she finds is stress related, 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, 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, stress levels have been greatly reduced and breathing has not been an issue as of late  -Frequency of rescue inhaler use: 1-2 times weekly  -Counseled on Proper inhaler technique; When to use rescue inhaler -Recommended to continue current medication   Allergic Rhinitis (Goal: Prevention of allergy symptoms / treatment of symptoms) -Controlled -Current treatment  Claritin 49m12m1 tablet daily  Nasocort 55mc29mt - 2 sprays into each nostril daily Saline 0.65% nasal spray - 1 spray into each nostril as needed  - Noted that patient can only take brand medications -Medications previously tried: n/a  -Recommended to continue current medication  Osteopenia (Goal Prevention of Fractures / Disease Prgression) -Controlled -Last DEXA Scan: 07/04/2016   T-Score right femoral neck: -2.1  10-year probability of major osteoporotic fracture: 5.5%  10-year probability of hip fracture:  1.0% -Patient is not a candidate for pharmacologic treatment -Current treatment  Calcium VitaminD3 (650mg-14m units) - 1 tablet daily  -Medications previously tried: n/a  -Recommend [phone] units of vitamin D daily. Recommend 1200 mg of calcium daily from dietary and  supplemental sources. Recommend weight-bearing and muscle strengthening exercises for building and maintaining bone density. -Recommended for patient to increase calcium/VitD supplement to taking 2 tablets daily, - total daily dose of Calcium: 1314m and Vitamin D: 2000 units - patient agreeable to plan  Health Maintenance -Vaccine gaps: shingrix and COVID booster  -Current therapy:  Muccinex 6074m- 1 tablet twice daily  Multivitamin - 1 tablet daily  Renew Intensive Skin Therapy - 1 application daily  -Educated on Herbal supplement research is limited and benefits usually cannot be proven Cost vs benefit of each product must be carefully weighed by individual consumer -Patient is satisfied with current therapy and denies issues -Recommended to continue current medication  Patient Goals/Self-Care Activities Patient will:  - take medications as prescribed collaborate with provider on medication access solutions target a minimum of 150 minutes of moderate intensity exercise weekly  Follow Up Plan: Telephone follow up appointment with care management team member scheduled for: The patient has been provided with contact information for the care management team and has been advised to call with any health related questions or concerns.        Medication Assistance: None required.  Patient affirms current coverage meets needs.  Compliance/Adherence/Medication fill history: Care Gaps: Shingles and COVID booster vaccinations  [id number] [place] [id number]: [phone]: [phone] pill box? No -  able to manage without Pt endorses 95-100% compliance  Care Plan and Follow Up Patient Decision:  Patient agrees to Care Plan and Follow-up.  Plan: Telephone follow up appointment with care management team member scheduled for:  3 months and The patient has been provided with contact information for the care management team and has been advised to call with any health related questions or concerns.   DanTomasa BlaseharmD Clinical Pharmacist, [place]

## 2021-01-04 ENCOUNTER — Other Ambulatory Visit: Payer: Self-pay

## 2021-01-04 ENCOUNTER — Ambulatory Visit (INDEPENDENT_AMBULATORY_CARE_PROVIDER_SITE_OTHER): Payer: Medicare HMO

## 2021-01-04 DIAGNOSIS — E785 Hyperlipidemia, unspecified: Secondary | ICD-10-CM | POA: Diagnosis not present

## 2021-01-04 DIAGNOSIS — J4520 Mild intermittent asthma, uncomplicated: Secondary | ICD-10-CM | POA: Diagnosis not present

## 2021-01-04 DIAGNOSIS — J309 Allergic rhinitis, unspecified: Secondary | ICD-10-CM

## 2021-01-04 NOTE — Patient Instructions (Signed)
Visit Information   PATIENT GOALS:   Goals Addressed             This Visit's Progress    Prevent Falls and Broken Bones-Osteopenia       Timeframe:  Long-Range Goal Priority:  High Start Date:  01/04/2021                           Expected End Date:  07/07/2021                     Follow Up Date 04/06/2021   - always use handrails on the stairs - always wear shoes or slippers with non-slip sole - get at least 10 minutes of activity every day - keep cell phone with me always - pick up clutter from the floors - wear low heeled or flat shoes with non-skid soles    Why is this important?   When you fall, there are 3 things that control if a bone breaks or not.  These are the fall itself, how hard and the direction that you fall and how fragile your bones are.  Preventing falls is very important for you because of fragile bones.        Track and Manage My Symptoms-Asthma       Timeframe:  Long-Range Goal Priority:  High Start Date: 01/04/2021                            Expected End Date:  07/07/2021                     Follow Up Date 04/06/2021   - avoid symptom triggers outdoors - begin a symptom diary - eliminate symptom triggers at home - follow asthma action plan - keep follow-up appointments - keep rescue medicines on hand    Why is this important?   Keeping track of asthma symptoms can tell you a lot about your asthma control.  Based on symptoms and peak flow results you can see how well you are doing.  Your asthma action plan has a green, yellow and red zone. Green means all is good; it is your goal. Yellow means your symptoms are a little worse. You will need to adjust your medications. Being in the red zone means that your   asthma is out of control. You will need to use your rescue medicines. You may need emergency care.           Consent to CCM Services: [name] was given information about Chronic Care Management services today including:  CCM  service includes personalized support from designated clinical staff supervised by her physician, including individualized plan of care and coordination with other care providers 24/7 contact phone numbers for assistance for urgent and routine care needs. Service will only be billed when office clinical staff spend 20 minutes or more in a month to coordinate care. Only one practitioner may furnish and bill the service in a calendar month. The patient may stop CCM services at any time (effective at the end of the month) by phone call to the office staff. The patient will be responsible for cost sharing (co-pay) of up to 20% of the service fee (after annual deductible is met).  Patient agreed to services and verbal consent obtained.   Patient verbalizes understanding of instructions provided today and agrees to view in [place].  Telephone follow up appointment with care management team member scheduled for: 3 months The patient has been provided with contact information for the care management team and has been advised to call with any health related questions or concerns.   Tomasa Blase, PharmD Clinical Pharmacist, [place]   CLINICAL CARE PLAN: Patient Care Plan: CCM Care Plan     Problem Identified: HLD, Osteopenia, Asthma, Allergic Rhinitis   Priority: High  Onset Date: 01/04/2021     Long-Range Goal: Disease Management   Start Date: 01/04/2021  Expected End Date: 07/07/2021  This Visit's Progress: On track  Priority: High  Note:   Current Barriers:  Unable to independently monitor therapeutic efficacy Does not contact provider office for questions/concerns  Pharmacist Clinical Goal(s):  Patient will achieve adherence to monitoring guidelines and medication adherence to achieve therapeutic efficacy maintain control of LDL as evidenced by next lipid panel  contact provider office for questions/concerns as evidenced notation of same in electronic health record  through collaboration with PharmD and provider.   Interventions: 1:1 collaboration with Janith Lima, MD regarding development and update of comprehensive plan of care as evidenced by provider attestation and co-signature Inter-disciplinary care team collaboration (see longitudinal plan of care) Comprehensive medication review performed; medication list updated in electronic medical record  Hyperlipidemia: (LDL goal < 130) -Controlled -Last LDL 46m/dL (10/10/2020) -Current treatment: Omega 3 Fish oils 6682m- 1 capsule daily  Phytomega - 2 tablets twice daily  -Medications previously tried: n/a prescribed rosuvastatin but never started   -Current dietary patterns: notes to major change in diet since last visit, cooking many of her own meals at this time, reduced amount of sweet, fried, and fatty foods that she had previously been eating  -Current exercise habits: reports to daily activity working around her home  -Educated on Cholesterol goals;  Importance of limiting foods high in cholesterol; Exercise goal of 150 minutes per week; -Recommended to continue current medication  Asthma(Goal: control symptoms and prevent exacerbations) -Controlled -Current treatment  Pulmicort 180 mcg/act - 1 puff every 6 hours as needed (using 1-2 times weekly) Ventolin HFA 10869mact - 2 puffs every 6 hours as needed (using 1-2 times weekly) - Noted that patient can only take Brand medications  -Medications previously tried: arnuity  -Exacerbations requiring treatment in last 6 months: n/a - reports that much of her asthma she finds is stress related, 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, 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, stress levels have been greatly reduced and breathing has not been an issue as of late  -Frequency of rescue inhaler use: 1-2 times weekly  -Counseled on Proper inhaler technique; When to use rescue inhaler -Recommended to continue current medication   Allergic  Rhinitis (Goal: Prevention of allergy symptoms / treatment of symptoms) -Controlled -Current treatment  Claritin 66m64m1 tablet daily  Nasocort 55mc42mt - 2 sprays into each nostril daily Saline 0.65% nasal spray - 1 spray into each nostril as needed  - Noted that patient can only take brand medications -Medications previously tried: n/a  -Recommended to continue current medication  Osteopenia (Goal Prevention of Fractures / Disease Prgression) -Controlled -Last DEXA Scan: 07/04/2016   T-Score right femoral neck: -2.1  10-year probability of major osteoporotic fracture: 5.5%  10-year probability of hip fracture: 1.0% -Patient is not a candidate for pharmacologic treatment -Current treatment  Calcium VitaminD3 (650mg-27m units) - 1 tablet daily  -Medications previously tried: n/a  -Recommend [phone] units of  vitamin D daily. Recommend 1200 mg of calcium daily from dietary and supplemental sources. Recommend weight-bearing and muscle strengthening exercises for building and maintaining bone density. -Recommended for patient to increase calcium/VitD supplement to taking 2 tablets daily, - total daily dose of Calcium: 1372m and Vitamin D: 2000 units - patient agreeable to plan  Health Maintenance -Vaccine gaps: shingrix and COVID booster  -Current therapy:  Muccinex 6062m- 1 tablet daily if needed  Multivitamin - 1 tablet daily  Renew Intensive Skin Therapy - 1 application daily  -Educated on Herbal supplement research is limited and benefits usually cannot be proven Cost vs benefit of each product must be carefully weighed by individual consumer -Patient is satisfied with current therapy and denies issues -Recommended to continue current medication  Patient Goals/Self-Care Activities Patient will:  - take medications as prescribed collaborate with provider on medication access solutions target a minimum of 150 minutes of moderate intensity exercise weekly  Follow Up Plan:  Telephone follow up appointment with care management team member scheduled for: The patient has been provided with contact information for the care management team and has been advised to call with any health related questions or concerns.

## 2021-01-09 ENCOUNTER — Ambulatory Visit: Payer: Medicare HMO | Admitting: Internal Medicine

## 2021-01-16 ENCOUNTER — Other Ambulatory Visit: Payer: Self-pay

## 2021-01-16 ENCOUNTER — Encounter: Payer: Self-pay | Admitting: Internal Medicine

## 2021-01-16 ENCOUNTER — Ambulatory Visit (INDEPENDENT_AMBULATORY_CARE_PROVIDER_SITE_OTHER): Payer: Medicare HMO | Admitting: Internal Medicine

## 2021-01-16 VITALS — BP 136/84 | HR 78 | Temp 98.1°F | Resp 16 | Ht 63.8 in | Wt 121.0 lb

## 2021-01-16 DIAGNOSIS — D563 Thalassemia minor: Secondary | ICD-10-CM | POA: Diagnosis not present

## 2021-01-16 DIAGNOSIS — E785 Hyperlipidemia, unspecified: Secondary | ICD-10-CM | POA: Diagnosis not present

## 2021-01-16 DIAGNOSIS — J4520 Mild intermittent asthma, uncomplicated: Secondary | ICD-10-CM | POA: Diagnosis not present

## 2021-01-16 DIAGNOSIS — Z23 Encounter for immunization: Secondary | ICD-10-CM

## 2021-01-16 LAB — LIPID PANEL
Cholesterol: 188 mg/dL (ref 0–200)
HDL: 69.1 mg/dL (ref >39.00)
LDL Cholesterol: 108 mg/dL — ABNORMAL HIGH (ref 0–99)
NonHDL: 119.18
Total CHOL/HDL Ratio: 3
Triglycerides: 55 mg/dL (ref 0.0–149.0)
VLDL: 11 mg/dL (ref 0.0–40.0)

## 2021-01-16 NOTE — Patient Instructions (Signed)
High Cholesterol  High cholesterol is a condition in which the blood has high levels of a white, waxy substance similar to fat (cholesterol). The liver makes all the cholesterol that the body needs. The human body needs small amounts of cholesterol to help build cells. A person gets extra orexcess cholesterol from the food that he or she eats. The blood carries cholesterol from the liver to the rest of the body. If you have high cholesterol, deposits (plaques) may build up on the walls of your arteries. Arteries are the blood vessels that carry blood away from your heart. These plaques make the arteries narrowand stiff. Cholesterol plaques increase your risk for heart attack and stroke. Work withyour health care provider to keep your cholesterol levels in a healthy range. What increases the risk? The following factors may make you more likely to develop this condition: Eating foods that are high in animal fat (saturated fat) or cholesterol. Being overweight. Not getting enough exercise. A family history of high cholesterol (familial hypercholesterolemia). Use of tobacco products. Having diabetes. What are the signs or symptoms? There are no symptoms of this condition. How is this diagnosed? This condition may be diagnosed based on the results of a blood test. If you are older than 72 years of age, your health care provider may check your cholesterol levels every 4-6 years. You may be checked more often if you have high cholesterol or other risk factors for heart disease. The blood test for cholesterol measures: "Bad" cholesterol, or LDL cholesterol. This is the main type of cholesterol that causes heart disease. The desired level is less than 100 mg/dL. "Good" cholesterol, or HDL cholesterol. HDL helps protect against heart disease by cleaning the arteries and carrying the LDL to the liver for processing. The desired level for HDL is 60 mg/dL or higher. Triglycerides. These are fats that your  body can store or burn for energy. The desired level is less than 150 mg/dL. Total cholesterol. This measures the total amount of cholesterol in your blood and includes LDL, HDL, and triglycerides. The desired level is less than 200 mg/dL. How is this treated? This condition may be treated with: Diet changes. You may be asked to eat foods that have more fiber and less saturated fats or added sugar. Lifestyle changes. These may include regular exercise, maintaining a healthy weight, and quitting use of tobacco products. Medicines. These are given when diet and lifestyle changes have not worked. You may be prescribed a statin medicine to help lower your cholesterol levels. Follow these instructions at home: Eating and drinking  Eat a healthy, balanced diet. This diet includes: Daily servings of a variety of fresh, frozen, or canned fruits and vegetables. Daily servings of whole grain foods that are rich in fiber. Foods that are low in saturated fats and trans fats. These include poultry and fish without skin, lean cuts of meat, and low-fat dairy products. A variety of fish, especially oily fish that contain omega-3 fatty acids. Aim to eat fish at least 2 times a week. Avoid foods and drinks that have added sugar. Use healthy cooking methods, such as roasting, grilling, broiling, baking, poaching, steaming, and stir-frying. Do not fry your food except for stir-frying.  Lifestyle  Get regular exercise. Aim to exercise for a total of 150 minutes a week. Increase your activity level by doing activities such as gardening, walking, and taking the stairs. Do not use any products that contain nicotine or tobacco, such as cigarettes, e-cigarettes, and chewing tobacco.   If you need help quitting, ask your health care provider.  General instructions Take over-the-counter and prescription medicines only as told by your health care provider. Keep all follow-up visits as told by your health care provider.  This is important. Where to find more information American Heart Association: www.heart.org National Heart, Lung, and Blood Institute: www.nhlbi.nih.gov Contact a health care provider if: You have trouble achieving or maintaining a healthy diet or weight. You are starting an exercise program. You are unable to stop smoking. Get help right away if: You have chest pain. You have trouble breathing. You have any symptoms of a stroke. "BE FAST" is an easy way to remember the main warning signs of a stroke: B - Balance. Signs are dizziness, sudden trouble walking, or loss of balance. E - Eyes. Signs are trouble seeing or a sudden change in vision. F - Face. Signs are sudden weakness or numbness of the face, or the face or eyelid drooping on one side. A - Arms. Signs are weakness or numbness in an arm. This happens suddenly and usually on one side of the body. S - Speech. Signs are sudden trouble speaking, slurred speech, or trouble understanding what people say. T - Time. Time to call emergency services. Write down what time symptoms started. You have other signs of a stroke, such as: A sudden, severe headache with no known cause. Nausea or vomiting. Seizure. These symptoms may represent a serious problem that is an emergency. Do not wait to see if the symptoms will go away. Get medical help right away. Call your local emergency services (911 in the [place] Do not drive yourself to the hospital. Summary Cholesterol plaques increase your risk for heart attack and stroke. Work with your health care provider to keep your cholesterol levels in a healthy range. Eat a healthy, balanced diet, get regular exercise, and maintain a healthy weight. Do not use any products that contain nicotine or tobacco, such as cigarettes, e-cigarettes, and chewing tobacco. Get help right away if you have any symptoms of a stroke. This information is not intended to replace advice given to you by your health care  provider. Make sure you discuss any questions you have with your healthcare provider. Document Revised: 05/09/2019 Document Reviewed: 05/09/2019 Elsevier Patient Education  2022 [employer].  

## 2021-01-16 NOTE — Progress Notes (Signed)
Subjective:  Patient ID: Jessica Thompson, female    DOB: December 26, 1950  Age: 70 y.o. MRN: [id number]  CC: Anemia  This visit occurred during the SARS-CoV-2 public health emergency.  Safety protocols were in place, including screening questions prior to the visit, additional usage of staff PPE, and extensive cleaning of exam room while observing appropriate contact time as indicated for disinfecting solutions.    HPI PATRICE AVETISYAN presents for f/up -  She feels well.  Offers no complaints today.  Outpatient Medications Prior to Visit  Medication Sig Dispense Refill   albuterol (VENTOLIN HFA) 108 (90 Base) MCG/ACT inhaler INHALE 2 PUFFS BY MOUTH EVERY 6 HOURS AS NEEDED FOR WHEEZE OR SHORTNESS OF BREATH 18 g 3   budesonide (PULMICORT) 180 MCG/ACT inhaler Inhale 1 puff into the lungs every 6 (six) hours as needed (wheezing). 3 each 3   CALCIUM CARB-CHOLECALCIFEROL PO Take 2 tablets by mouth daily. '650mg'$  / 1000 units (per tablet)     Emollient (RA RENEWAL DRY SKIN THERAPY EX) Apply 1 application topically daily.     guaiFENesin (MUCINEX) 600 MG 12 hr tablet Take 600 mg by mouth daily as needed.     loratadine (CLARITIN) 10 MG tablet Take 10 mg by mouth daily.     Multiple Vitamins-Minerals (CENTRUM SILVER ADULT 50+ PO) Take 1 tablet by mouth daily.     Omega-3 Fatty Acids (OMEGA 3 PO) Take 660 mg by mouth daily.     OVER THE COUNTER MEDICATION 2 tablets in the morning and at bedtime. Phytomega - 1 serving = 2 tablets contains: Vitamin C: '10mg'$   Vitamin E: '4mg'$  Phytosterol Esters: '1000mg'$  Fish Oil: '500mg'$   Alpha lipoic Acid: '15mg'$   CoQ 10 enzyme: '15mg'$   Proprietary blend: '95mg'$      sodium chloride (OCEAN) 0.65 % SOLN nasal spray Place 1 spray into both nostrils as needed for congestion.     triamcinolone (NASACORT) 55 MCG/ACT AERO nasal inhaler Place 2 sprays into the nose daily. Reported on 06/26/2015     No facility-administered medications prior to visit.    ROS Review of Systems   Constitutional:  Negative for appetite change, diaphoresis and fatigue.  HENT: Negative.    Eyes: Negative.   Respiratory:  Negative for cough, chest tightness, shortness of breath and wheezing.   Cardiovascular:  Negative for chest pain, palpitations and leg swelling.  Gastrointestinal:  Negative for abdominal pain, constipation, diarrhea, nausea and vomiting.  Endocrine: Negative.   Genitourinary: Negative.  Negative for difficulty urinating.  Musculoskeletal: Negative.  Negative for arthralgias and myalgias.  Skin: Negative.   Neurological:  Negative for dizziness.  Hematological:  Negative for adenopathy. Does not bruise/bleed easily.  Psychiatric/Behavioral: Negative.     Objective:  BP 136/84 (BP Location: Left Arm, Patient Position: Sitting, Cuff Size: Normal)   Pulse 78   Temp 98.1 F (36.7 C) (Oral)   Resp 16   Ht 5' 3.8" (1.621 m)   Wt 121 lb (54.9 kg)   SpO2 99%   BMI 20.90 kg/m   BP Readings from Last 3 Encounters:  01/16/21 136/84  10/10/20 136/82  06/08/20 137/74    Wt Readings from Last 3 Encounters:  01/16/21 121 lb (54.9 kg)  10/10/20 126 lb (57.2 kg)  04/11/20 133 lb (60.3 kg)    Physical Exam Vitals reviewed.  HENT:     Nose: Nose normal.     Mouth/Throat:     Mouth: Mucous membranes are moist.  Eyes:     Conjunctiva/sclera:  Conjunctivae normal.  Cardiovascular:     Rate and Rhythm: Normal rate and regular rhythm.     Heart sounds: No murmur heard. Pulmonary:     Effort: Pulmonary effort is normal.     Breath sounds: No stridor. No wheezing, rhonchi or rales.  Abdominal:     General: Abdomen is flat.     Palpations: There is no mass.     Tenderness: There is no abdominal tenderness. There is no guarding.  Musculoskeletal:        General: Normal range of motion.     Cervical back: Neck supple.     Right lower leg: No edema.     Left lower leg: No edema.  Lymphadenopathy:     Cervical: No cervical adenopathy.  Skin:    General: Skin  is warm and dry.  Neurological:     General: No focal deficit present.     Mental Status: She is alert.  Psychiatric:        Mood and Affect: Mood normal.        Behavior: Behavior normal.    Lab Results  Component Value Date   WBC 4.4 01/16/2021   HGB 9.3 (L) 01/16/2021   HCT 30.2 (L) 01/16/2021   PLT 205.0 01/16/2021   GLUCOSE 78 04/11/2020   CHOL 188 01/16/2021   TRIG 55.0 01/16/2021   HDL 69.10 01/16/2021   LDLCALC 108 (H) 01/16/2021   ALT 13 04/11/2020   AST 20 04/11/2020   NA 138 04/11/2020   K 4.7 04/11/2020   CL 103 04/11/2020   CREATININE 0.93 04/11/2020   BUN 17 04/11/2020   CO2 33 (H) 04/11/2020   TSH 1.75 10/10/2020    No results found.  Assessment & Plan:   Davion was seen today for anemia.  Diagnoses and all orders for this visit:  Thalassemia trait- Her H&H are well controlled. -     CBC with Differential/Platelet; Future -     CBC with Differential/Platelet  Mild intermittent asthma without complication- Her symptoms are well controlled. -     CBC with Differential/Platelet; Future -     CBC with Differential/Platelet  Hyperlipidemia with target LDL less than 130- Her ASCVD risk score is just below 15% so I did not recommend a statin for CV risk reduction. -     Lipid panel; Future -     Lipid panel  Need for shingles vaccine -     Discontinue: Zoster Vaccine Adjuvanted [facility]) injection; Inject 0.5 mLs into the muscle once for 1 dose.  I have discontinued Derenda Mis. Rosero's Shingrix. I am also having her maintain her loratadine, triamcinolone, Multiple Vitamins-Minerals (CENTRUM SILVER ADULT 50+ PO), Omega-3 Fatty Acids (OMEGA 3 PO), guaiFENesin, albuterol, budesonide, sodium chloride, Emollient (RA RENEWAL DRY SKIN THERAPY EX), OVER THE COUNTER MEDICATION, and CALCIUM CARB-CHOLECALCIFEROL PO.  Meds ordered this encounter  Medications   DISCONTD: Zoster Vaccine Adjuvanted [facility]) injection    Sig: Inject 0.5 mLs into the muscle  once for 1 dose.    Dispense:  0.5 mL    Refill:  1      Follow-up: Return in about 6 months (around 07/19/2021).  Scarlette Calico, MD

## 2021-01-17 ENCOUNTER — Encounter: Payer: Self-pay | Admitting: Internal Medicine

## 2021-01-17 LAB — CBC WITH DIFFERENTIAL/PLATELET
Basophils Relative: 0.7 % (ref 0.0–3.0)
Eosinophils Relative: 4.7 % (ref 0.0–5.0)
HCT: 30.2 % — ABNORMAL LOW (ref 36.0–46.0)
Hemoglobin: 9.3 g/dL — ABNORMAL LOW (ref 12.0–15.0)
Lymphocytes Relative: 63.1 % — ABNORMAL HIGH (ref 12.0–46.0)
MCHC: 30.6 g/dL (ref 30.0–36.0)
MCV: 71.1 fl — ABNORMAL LOW (ref 78.0–100.0)
Monocytes Relative: 6 % (ref 3.0–12.0)
Neutrophils Relative %: 25.5 % — ABNORMAL LOW (ref 43.0–77.0)
Platelets: 205 10*3/uL (ref 150.0–400.0)
RBC: 4.25 Mil/uL (ref 3.87–5.11)
RDW: 17.2 % — ABNORMAL HIGH (ref 11.5–15.5)
WBC: 4.4 10*3/uL (ref 4.0–10.5)

## 2021-01-17 MED ORDER — SHINGRIX 50 MCG/0.5ML IM SUSR: 0.5000 mL | Freq: Once | INTRAMUSCULAR | 1 refills | Status: DC

## 2021-01-18 ENCOUNTER — Encounter: Payer: Self-pay | Admitting: Internal Medicine

## 2021-02-11 ENCOUNTER — Encounter: Payer: Self-pay | Admitting: Internal Medicine

## 2021-02-11 ENCOUNTER — Other Ambulatory Visit: Payer: Self-pay

## 2021-02-11 ENCOUNTER — Ambulatory Visit (INDEPENDENT_AMBULATORY_CARE_PROVIDER_SITE_OTHER): Payer: Medicare HMO | Admitting: Internal Medicine

## 2021-02-11 DIAGNOSIS — J309 Allergic rhinitis, unspecified: Secondary | ICD-10-CM | POA: Diagnosis not present

## 2021-02-11 MED ORDER — MONTELUKAST SODIUM 10 MG PO TABS
10.0000 mg | ORAL_TABLET | Freq: Every day | ORAL | 0 refills | Status: DC
Start: 2021-02-11 — End: 2021-05-30

## 2021-02-11 NOTE — Progress Notes (Signed)
   Subjective:   Patient ID: Jessica Thompson, female    DOB: 29-May-1951, 70 y.o.   MRN: [id number]  HPI The patient is a 70 YO female coming in for nose drainage. Started clear now slight yellow. Stared maybe 2 weeks ago. No accompanying symptoms including cough, SOB, fever, chill, ear pain, sinus pain, headache. Did not covid-19 test at onset. IS vaccinated against covid-19.   Review of Systems  Constitutional: Negative.   HENT:  Positive for rhinorrhea.   Eyes: Negative.   Respiratory:  Negative for cough, chest tightness and shortness of breath.   Cardiovascular:  Negative for chest pain, palpitations and leg swelling.  Gastrointestinal:  Negative for abdominal distention, abdominal pain, constipation, diarrhea, nausea and vomiting.  Musculoskeletal: Negative.   Skin: Negative.   Neurological: Negative.   Psychiatric/Behavioral: Negative.     Objective:  Physical Exam Constitutional:      Appearance: She is well-developed.  HENT:     Head: Normocephalic and atraumatic.     Nose: No rhinorrhea.     Mouth/Throat:     Pharynx: Posterior oropharyngeal erythema present. No oropharyngeal exudate.  Cardiovascular:     Rate and Rhythm: Normal rate and regular rhythm.  Pulmonary:     Effort: Pulmonary effort is normal. No respiratory distress.     Breath sounds: Normal breath sounds. No wheezing or rales.  Abdominal:     General: Bowel sounds are normal. There is no distension.     Palpations: Abdomen is soft.     Tenderness: There is no abdominal tenderness. There is no rebound.  Musculoskeletal:     Cervical back: Normal range of motion.  Skin:    General: Skin is warm and dry.  Neurological:     Mental Status: She is alert and oriented to person, place, and time.     Coordination: Coordination normal.    Vitals:   02/11/21 1344  BP: 122/78  Pulse: 62  Resp: 18  Temp: 97.7 F (36.5 C)  TempSrc: Oral  SpO2: 96%  Weight: 117 lb 12.8 oz (53.4 kg)  Height: 5' 3.8"  (1.621 m)    This visit occurred during the SARS-CoV-2 public health emergency.  Safety protocols were in place, including screening questions prior to the visit, additional usage of staff PPE, and extensive cleaning of exam room while observing appropriate contact time as indicated for disinfecting solutions.   Assessment & Plan:

## 2021-02-11 NOTE — Assessment & Plan Note (Signed)
Rx singulair to add to nasacort and claritin. Beyond the window for covid-19 testing at this time. No indication for antibiotics.

## 2021-02-11 NOTE — Patient Instructions (Signed)
I have sent in singulair to take daily to help dry up the nasal drainage.   Take this for 1-2 weeks.

## 2021-02-19 ENCOUNTER — Encounter: Payer: Self-pay | Admitting: Internal Medicine

## 2021-03-10 ENCOUNTER — Other Ambulatory Visit: Payer: Self-pay | Admitting: Internal Medicine

## 2021-03-19 ENCOUNTER — Encounter: Payer: Self-pay | Admitting: Internal Medicine

## 2021-03-19 ENCOUNTER — Other Ambulatory Visit: Payer: Self-pay | Admitting: Internal Medicine

## 2021-03-19 DIAGNOSIS — J4520 Mild intermittent asthma, uncomplicated: Secondary | ICD-10-CM

## 2021-03-19 MED ORDER — ALBUTEROL SULFATE (2.5 MG/3ML) 0.083% IN NEBU
2.5000 mg | INHALATION_SOLUTION | Freq: Four times a day (QID) | RESPIRATORY_TRACT | 3 refills | Status: DC | PRN
Start: 2021-03-19 — End: 2021-07-26

## 2021-03-19 MED ORDER — ALBUTEROL SULFATE HFA 108 (90 BASE) MCG/ACT IN AERS
INHALATION_SPRAY | RESPIRATORY_TRACT | 3 refills | Status: DC
Start: 2021-03-19 — End: 2021-07-21

## 2021-03-19 MED ORDER — BUDESONIDE 180 MCG/ACT IN AEPB: 1.0000 | INHALATION_SPRAY | Freq: Four times a day (QID) | RESPIRATORY_TRACT | 1 refills | Status: DC | PRN

## 2021-04-11 DIAGNOSIS — H5213 Myopia, bilateral: Secondary | ICD-10-CM | POA: Diagnosis not present

## 2021-04-11 DIAGNOSIS — H25041 Posterior subcapsular polar age-related cataract, right eye: Secondary | ICD-10-CM | POA: Diagnosis not present

## 2021-04-11 DIAGNOSIS — H2513 Age-related nuclear cataract, bilateral: Secondary | ICD-10-CM | POA: Diagnosis not present

## 2021-04-19 ENCOUNTER — Telehealth: Payer: Medicare HMO

## 2021-04-19 NOTE — Progress Notes (Incomplete)
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   -Advised for patient to reach out in the future with any issues or concerns about prescribed medications, did not start previously prescribed rosuvastatin or clobetasol ointment due to concern about possible side effects, patient agreeable to reach out for discussion in the future  Subjective: Jessica MEIXNERs an [age].o. year old female who is a primary patient of JoJanith LimaMD.  The CCM team was consulted for assistance with disease management and care coordination needs.    Engaged with patient by telephone for follow up visit in response to provider referral for pharmacy case management and/or care coordination services.   Consent to Services:  The patient was given the following information about Chronic Care Management services today, agreed to services, and gave verbal consent: 1. CCM service includes personalized support from designated clinical staff supervised by the primary care  provider, including individualized plan of care and coordination with other care providers 2. 24/7 contact phone numbers for assistance for urgent and routine care needs. 3. Service will only be billed when office clinical staff spend 20 minutes or more in a month to coordinate care. 4. Only one practitioner may furnish and bill the service in a calendar month. 5.The patient may stop CCM services at any time (effective at the end of the month) by phone call to the office staff. 6. The patient will be responsible for cost sharing (co-pay) of up to 20% of the service fee (after annual deductible is met). Patient agreed to services and consent obtained.  Patient Care Team: JoJanith LimaMD as PCP - General (Internal Medicine) SzDelice BisonaDarnelle MaffucciRPB[facility] Pharmacist (Pharmacist)  Recent office visits: 02/11/21 - Dr. CrSharlet Salina evaluations of nose drainage w/ yellow mucus - thought to be due to allergies - to add Singulair to allergy regimen  01/16/21 - Dr. JoRonnald Ramp f/u visit - no changes to medication - f/u in 6 months   Recent consult visits: 08/22/2020 - Dr. RePaulla Dolly DPM - presents with severely thickened dystrophic nailbeds - recommended removal of big toenails and epsom salt soaks  Hospital visits: 06/08/2020 - presents to ER after a fall (tripped over her dog) - hit chin again carpet - wound cleaned, given suture and wound care counseling   Objective:  Lab Results  Component Value Date   CREATININE 0.93 04/11/2020   BUN 17 04/11/2020   GFR 61.54 04/11/2020   NA 138 04/11/2020   K 4.7 04/11/2020   CALCIUM 9.8  04/11/2020   CO2 33 (H) 04/11/2020   GLUCOSE 78 04/11/2020    Lab Results  Component Value Date/Time   GFR 61.54 04/11/2020 03:25 PM   GFR 67.76 04/07/2019 11:12 AM    Last diabetic Eye exam:  No results found for: HMDIABEYEEXA  Last diabetic Foot exam:  No results found for: HMDIABFOOTEX   Lab Results  Component Value Date   CHOL 188 01/16/2021   HDL 69.10 01/16/2021    LDLCALC 108 (H) 01/16/2021   TRIG 55.0 01/16/2021   CHOLHDL 3 01/16/2021    Hepatic Function Latest Ref Rng & Units 04/11/2020 04/07/2019 02/25/2018  Total Protein 6.0 - 8.3 g/dL 7.4 7.3 7.4  Albumin 3.5 - 5.2 g/dL 4.3 4.2 4.1  AST 0 - 37 U/L '20 19 16  ' ALT 0 - 35 U/L '13 13 11  ' Alk Phosphatase 39 - 117 U/L 34(L) 40 50  Total Bilirubin 0.2 - 1.2 mg/dL 0.4 0.5 0.3  Bilirubin, Direct 0.0 - 0.3 mg/dL 0.1 - -    Lab Results  Component Value Date/Time   TSH 1.75 10/10/2020 02:11 PM   TSH 1.90 04/11/2020 03:25 PM    CBC Latest Ref Rng & Units 01/16/2021 10/10/2020 04/11/2020  WBC 4.0 - 10.5 K/uL 4.4 4.2 5.2  Hemoglobin 12.0 - 15.0 g/dL 9.3(L) 9.2(L) 9.3 Repeated and verified X2.(L)  Hematocrit 36.0 - 46.0 % 30.2(L) 30.0(L) 29.9(L)  Platelets 150.0 - 400.0 K/uL 205.0 226.0 218.0    No results found for: VD25OH  Clinical ASCVD: No  The 10-year ASCVD risk score (Arnett DK, et al., 2019) is: 12.4%   Values used to calculate the score:     Age: 33 years     Sex: Female     Is Non-Hispanic African American: Yes     Diabetic: No     Tobacco smoker: No     Systolic Blood Pressure: 709 mmHg     Is BP treated: No     HDL Cholesterol: 69.1 mg/dL     Total Cholesterol: 188 mg/dL    Depression screen [facility] 2/9 04/11/2020 03/23/2019 09/02/2018  Decreased Interest 0 0 0  Down, Depressed, Hopeless 0 0 0  PHQ - 2 Score 0 0 0    Social History   Tobacco Use  Smoking Status Never  Smokeless Tobacco Never   BP Readings from Last 3 Encounters:  02/11/21 122/78  01/16/21 136/84  10/10/20 136/82   Pulse Readings from Last 3 Encounters:  02/11/21 62  01/16/21 78  10/10/20 75   Wt Readings from Last 3 Encounters:  02/11/21 117 lb 12.8 oz (53.4 kg)  01/16/21 121 lb (54.9 kg)  10/10/20 126 lb (57.2 kg)   BMI Readings from Last 3 Encounters:  02/11/21 20.35 kg/m  01/16/21 20.90 kg/m  10/10/20 21.76 kg/m    Assessment/Interventions: Review of patient past medical history, allergies,  medications, health status, including review of consultants reports, laboratory and other test data, was performed as part of comprehensive evaluation and provision of chronic care management services.   SDOH:  (Social Determinants of Health) assessments and interventions performed: Yes  SDOH Screenings   Alcohol Screen: Not on file  Depression (UKR8-3): Not on file  Financial Resource Strain: Low Risk    Difficulty of Paying Living Expenses: Not very hard  Food Insecurity: Not on file  Housing: Not on file  Physical Activity: Not on file  Social Connections: Not on file  Stress: Not on file  Tobacco Use: Low Risk  Smoking Tobacco Use: Never   Smokeless Tobacco Use: Never   Passive Exposure: Not on file  Transportation Needs: Not on file    [place]  Allergies  Allergen Reactions   Cabbage Other (See Comments)    congestion   Cheese Other (See Comments)    congestion   Corn-Containing Products Other (See Comments)    congestion   Erythromycin Other (See Comments)    Abdominal pain   Peanut-Containing Drug Products Other (See Comments)    congestion   Soy Allergy Other (See Comments)    congestion   Wheat Bran Other (See Comments)    congestion   Mushroom Extract Complex Hives, Rash and Other (See Comments)    Congestion     Medications Reviewed Today     Reviewed by Hoyt Koch, MD (Physician) on 02/11/21 at 1353  Med List Status: <None>   Medication Order Taking? Sig Documenting Provider Last Dose Status Informant  albuterol (VENTOLIN HFA) 108 (90 Base) MCG/ACT inhaler [id number] Yes INHALE 2 PUFFS BY MOUTH EVERY 6 HOURS AS NEEDED FOR WHEEZE OR SHORTNESS OF Cherly Beach, MD Taking Active   budesonide (PULMICORT) 180 MCG/ACT inhaler [id number] Yes Inhale 1 puff into the lungs every 6 (six) hours as needed (wheezing). Janith Lima, MD Taking Active   CALCIUM CARB-CHOLECALCIFEROL PO [id number] Yes Take 2 tablets by mouth daily. 648m / 1000  units (per tablet) [provider] Taking Active   Emollient (RA RENEWAL DRY SKIN THERAPY EX) [id number] Apply 1 application topically daily. [provider] Taking Active   guaiFENesin (MUCINEX) 600 MG 12 hr tablet [id number] Take 600 mg by mouth daily as needed. [provider] Taking Active   loratadine (CLARITIN) 10 MG tablet [id number] Take 10 mg by mouth daily. [provider] Taking Active Self  Multiple Vitamins-Minerals (CENTRUM SILVER ADULT 50+ PO) [id number] Take 1 tablet by mouth daily. [provider] Taking Active Self  Omega-3 Fatty Acids (OMEGA 3 PO) [id number] Take 660 mg by mouth daily. [provider] Taking Active   OVER THE COUNTER MEDICATION [id number] 2 tablets in the morning and at bedtime. Phytomega - 1 serving = 2 tablets contains: Vitamin C: 155m Vitamin E: 4m71mhytosterol Esters: 1000m77msh Oil: 500mg24mpha lipoic Acid: 15mg 14m 10 enzyme: 15mg  20mrietary blend: 95mg Pr24mer, Historical, MD Taking Active   sodium chloride (OCEAN) 0.65 % SOLN nasal spray [id number] 1 spray into both nostrils as needed for congestion. [provider] Taking Active   triamcinolone (NASACORT) 55 MCG/ACT AERO nasal inhaler [id number] 2 sprays into the nose daily. Reported on 06/26/2015 [provider] Taking Active Self            Patient Active Problem List   Diagnosis Date Noted   Intrinsic eczema 10/10/2020   Visit for screening mammogram 04/11/2020   Hyperlipidemia with target LDL less than 130 04/11/2020   Need for shingles vaccine 04/11/2020   Nasal polyposis 06/26/2015   Mild intermittent asthma 06/26/2015   Routine general medical examination at a health care facility 05/02/2015   Allergic rhinitis 10/22/2014   Thalassemia trait 10/22/2014    Immunization History  Administered Date(s) Administered   Influenza-Unspecified 02/21/2014, 04/10/2020    PFIZER(Purple Top)SARS-COV-2 Vaccination 08/24/2019, 09/22/2019   Pneumococcal Conjugate-13 05/02/2015   Pneumococcal Polysaccharide-23 10/05/2013, 04/11/2020   Tdap 07/05/2013, 06/08/2020   Zoster Recombinat (Shingrix) 04/14/2020, 06/30/2020    Conditions to  be addressed/monitored:  Hyperlipidemia, Asthma, Osteopenia, and Allergic Rhinitis  There are no care plans that you recently modified to display for this patient.     Medication Assistance: None required.  Patient affirms current coverage meets needs.  Compliance/Adherence/Medication fill history: Care Gaps: Shingles and COVID booster vaccinations  Patient's preferred pharmacy is:  [id number] [place] [address]Phone: [phone]: [phone]  Uses pill box? No - able to manage without Pt endorses 95-100% compliance  Care Plan and Follow Up Patient Decision:  Patient agrees to Care Plan and Follow-up.  Plan: Telephone follow up appointment with care management team member scheduled for:  3 months and The patient has been provided with contact information for the care management team and has been advised to call with any health related questions or concerns.   DTomasa Blase PharmD Clinical Pharmacist, LPickering  Chart prep = 7 minutes

## 2021-04-23 ENCOUNTER — Encounter: Payer: Self-pay | Admitting: Internal Medicine

## 2021-05-30 ENCOUNTER — Ambulatory Visit (INDEPENDENT_AMBULATORY_CARE_PROVIDER_SITE_OTHER): Payer: Medicare HMO | Admitting: Internal Medicine

## 2021-05-30 ENCOUNTER — Encounter: Payer: Self-pay | Admitting: Internal Medicine

## 2021-05-30 ENCOUNTER — Other Ambulatory Visit: Payer: Self-pay

## 2021-05-30 ENCOUNTER — Ambulatory Visit (INDEPENDENT_AMBULATORY_CARE_PROVIDER_SITE_OTHER): Payer: Medicare HMO

## 2021-05-30 VITALS — BP 146/90 | Temp 97.6°F | Ht 63.8 in | Wt 117.0 lb

## 2021-05-30 DIAGNOSIS — D563 Thalassemia minor: Secondary | ICD-10-CM

## 2021-05-30 DIAGNOSIS — Z23 Encounter for immunization: Secondary | ICD-10-CM | POA: Diagnosis not present

## 2021-05-30 DIAGNOSIS — U071 COVID-19: Secondary | ICD-10-CM | POA: Diagnosis not present

## 2021-05-30 DIAGNOSIS — R0600 Dyspnea, unspecified: Secondary | ICD-10-CM | POA: Diagnosis not present

## 2021-05-30 DIAGNOSIS — Z0000 Encounter for general adult medical examination without abnormal findings: Secondary | ICD-10-CM

## 2021-05-30 DIAGNOSIS — Z1211 Encounter for screening for malignant neoplasm of colon: Secondary | ICD-10-CM | POA: Insufficient documentation

## 2021-05-30 DIAGNOSIS — J40 Bronchitis, not specified as acute or chronic: Secondary | ICD-10-CM | POA: Diagnosis not present

## 2021-05-30 DIAGNOSIS — J4520 Mild intermittent asthma, uncomplicated: Secondary | ICD-10-CM | POA: Diagnosis not present

## 2021-05-30 DIAGNOSIS — R053 Chronic cough: Secondary | ICD-10-CM

## 2021-05-30 DIAGNOSIS — R0609 Other forms of dyspnea: Secondary | ICD-10-CM | POA: Diagnosis not present

## 2021-05-30 DIAGNOSIS — E785 Hyperlipidemia, unspecified: Secondary | ICD-10-CM

## 2021-05-30 DIAGNOSIS — Z1231 Encounter for screening mammogram for malignant neoplasm of breast: Secondary | ICD-10-CM

## 2021-05-30 DIAGNOSIS — R059 Cough, unspecified: Secondary | ICD-10-CM | POA: Diagnosis not present

## 2021-05-30 LAB — CBC WITH DIFFERENTIAL/PLATELET
Eosinophils Relative: 9 % — ABNORMAL HIGH (ref 0.0–5.0)
HCT: 33.7 % — ABNORMAL LOW (ref 36.0–46.0)
Hemoglobin: 10.2 g/dL — ABNORMAL LOW (ref 12.0–15.0)
Lymphocytes Relative: 40 % (ref 12.0–46.0)
MCHC: 30.2 g/dL (ref 30.0–36.0)
MCV: 71.5 fl — ABNORMAL LOW (ref 78.0–100.0)
Monocytes Relative: 3 % (ref 3.0–12.0)
Neutrophils Relative %: 48 % (ref 43.0–77.0)
Platelets: 275 10*3/uL (ref 150.0–400.0)
RBC: 4.71 Mil/uL (ref 3.87–5.11)
RDW: 17.7 % — ABNORMAL HIGH (ref 11.5–15.5)
WBC: 4.7 10*3/uL (ref 4.0–10.5)

## 2021-05-30 LAB — D-DIMER, QUANTITATIVE: D-Dimer, Quant: 0.19 mcg/mL FEU (ref <0.50)

## 2021-05-30 MED ORDER — MONTELUKAST SODIUM 10 MG PO TABS
10.0000 mg | ORAL_TABLET | Freq: Every day | ORAL | 1 refills | Status: DC
Start: 2021-05-30 — End: 2021-08-15

## 2021-05-30 NOTE — Patient Instructions (Signed)

## 2021-05-30 NOTE — Progress Notes (Signed)
Subjective:  Patient ID: Jessica Thompson, female    DOB: [birth year]  Age: 70 y.o. MRN: [id number]  CC: Annual Exam, Cough, and Asthma  This visit occurred during the SARS-CoV-2 public health emergency.  Safety protocols were in place, including screening questions prior to the visit, additional usage of staff PPE, and extensive cleaning of exam room while observing appropriate contact time as indicated for disinfecting solutions.    HPI Jessica Thompson presents for a CPX and f/up -   She complains of a 6-week history of nonproductive cough, dyspnea on exertion, and wheezing.  Singulair was prescribed about 4 months ago but she is not taking it.  She is using her inhalers.  She denies chest pain, diaphoresis, dizziness, lightheadedness, edema, or hemoptysis.  Outpatient Medications Prior to Visit  Medication Sig Dispense Refill   albuterol (PROVENTIL) (2.5 MG/3ML) 0.083% nebulizer solution Take 3 mLs (2.5 mg total) by nebulization every 6 (six) hours as needed for wheezing or shortness of breath. 150 mL 3   albuterol (VENTOLIN HFA) 108 (90 Base) MCG/ACT inhaler INHALE 2 PUFFS BY MOUTH EVERY 6 HOURS AS NEEDED FOR WHEEZE OR SHORTNESS OF BREATH 18 g 3   budesonide (PULMICORT) 180 MCG/ACT inhaler Inhale 1 puff into the lungs every 6 (six) hours as needed (wheezing). 3 each 1   CALCIUM CARB-CHOLECALCIFEROL PO Take 2 tablets by mouth daily. 650mg  / 1000 units (per tablet)     Emollient (RA RENEWAL DRY SKIN THERAPY EX) Apply 1 application topically daily.     guaiFENesin (MUCINEX) 600 MG 12 hr tablet Take 600 mg by mouth daily as needed.     loratadine (CLARITIN) 10 MG tablet Take 10 mg by mouth daily.     Multiple Vitamins-Minerals (CENTRUM SILVER ADULT 50+ PO) Take 1 tablet by mouth daily.     Omega-3 Fatty Acids (OMEGA 3 PO) Take 660 mg by mouth daily.     OVER THE COUNTER MEDICATION 2 tablets in the morning and at bedtime. Phytomega - 1 serving = 2 tablets contains: Vitamin C: 10mg    Vitamin E: 4mg  Phytosterol Esters: 1000mg  Fish Oil: 500mg   Alpha lipoic Acid: 15mg   CoQ 10 enzyme: 15mg   Proprietary blend: 95mg      sodium chloride (OCEAN) 0.65 % SOLN nasal spray Place 1 spray into both nostrils as needed for congestion.     triamcinolone (NASACORT) 55 MCG/ACT AERO nasal inhaler Place 2 sprays into the nose daily. Reported on 06/26/2015     montelukast (SINGULAIR) 10 MG tablet Take 1 tablet (10 mg total) by mouth at bedtime. 30 tablet 0   No facility-administered medications prior to visit.    ROS Review of Systems  Constitutional:  Negative for chills, diaphoresis, fatigue and fever.  HENT:  Positive for postnasal drip and rhinorrhea. Negative for facial swelling, nosebleeds, sinus pressure, sore throat, trouble swallowing and voice change.   Eyes: Negative.   Respiratory:  Positive for cough and wheezing. Negative for choking, chest tightness, shortness of breath and stridor.   Cardiovascular:  Negative for chest pain, palpitations and leg swelling.  Gastrointestinal:  Negative for abdominal pain, constipation, diarrhea, nausea and vomiting.  Endocrine: Negative.   Genitourinary: Negative.  Negative for difficulty urinating.  Musculoskeletal: Negative.   Skin: Negative.  Negative for color change and rash.  Neurological: Negative.  Negative for dizziness, weakness, light-headedness and headaches.  Hematological:  Negative for adenopathy. Does not bruise/bleed easily.  Psychiatric/Behavioral: Negative.     Objective:  BP (!) 146/90 (BP  Location: Left Arm, Patient Position: Sitting, Cuff Size: Normal)   Temp 97.6 F (36.4 C) (Oral)   Ht 5' 3.8" (1.621 m)   Wt 117 lb (53.1 kg)   BMI 20.21 kg/m   BP Readings from Last 3 Encounters:  05/30/21 (!) 146/90  02/11/21 122/78  01/16/21 136/84    Wt Readings from Last 3 Encounters:  05/30/21 117 lb (53.1 kg)  02/11/21 117 lb 12.8 oz (53.4 kg)  01/16/21 121 lb (54.9 kg)    Physical Exam Vitals reviewed.   Constitutional:      Appearance: Normal appearance. She is not ill-appearing.  HENT:     Nose: Nose normal.     Mouth/Throat:     Mouth: Mucous membranes are moist.  Eyes:     General: No scleral icterus.    Conjunctiva/sclera: Conjunctivae normal.  Cardiovascular:     Rate and Rhythm: Normal rate and regular rhythm.     Heart sounds: Normal heart sounds, S1 normal and S2 normal. No murmur heard.   No friction rub. No gallop.     Comments: EKG- NSR, 68 bpm Normal EKG Pulmonary:     Effort: Pulmonary effort is normal.     Breath sounds: No stridor. No wheezing, rhonchi or rales.  Abdominal:     General: Abdomen is protuberant. Bowel sounds are normal. There is no distension.     Palpations: Abdomen is soft. There is no hepatomegaly, splenomegaly or mass.     Tenderness: There is no abdominal tenderness. There is no guarding.  Musculoskeletal:     Cervical back: Neck supple.     Right lower leg: No edema.     Left lower leg: No edema.  Lymphadenopathy:     Cervical: No cervical adenopathy.  Skin:    General: Skin is warm and dry.  Neurological:     General: No focal deficit present.     Mental Status: She is alert.  Psychiatric:        Mood and Affect: Mood normal.        Behavior: Behavior normal.    Lab Results  Component Value Date   WBC 4.7 05/30/2021   HGB 10.2 (L) 05/30/2021   HCT 33.7 (L) 05/30/2021   PLT 275.0 05/30/2021   GLUCOSE 78 04/11/2020   CHOL 199 05/30/2021   TRIG 82.0 05/30/2021   HDL 87.10 05/30/2021   LDLCALC 96 05/30/2021   ALT 13 04/11/2020   AST 20 04/11/2020   NA 138 04/11/2020   K 4.7 04/11/2020   CL 103 04/11/2020   CREATININE 0.93 04/11/2020   BUN 17 04/11/2020   CO2 33 (H) 04/11/2020   TSH 1.75 10/10/2020    DG Chest 2 View  Result Date: 05/30/2021 CLINICAL DATA:  Cough for 6 weeks. Dyspnea on exertion for 2 months. EXAM: CHEST - 2 VIEW COMPARISON:  X-ray chest 02/25/2018. FINDINGS: The heart size and mediastinal contours are  within normal limits. Both lungs are clear. The visualized skeletal structures are unremarkable. IMPRESSION: No active cardiopulmonary disease. Electronically Signed   By: Davina Poke D.O.   On: 05/30/2021 16:17     Assessment & Plan:   Dorsey was seen today for annual exam, cough and asthma.  Diagnoses and all orders for this visit:  Visit for screening mammogram -     MM DIGITAL SCREENING BILATERAL; Future  Hyperlipidemia with target LDL less than 130- Her ASCVD risk score is up to 18%.  I recommended that she take a statin for  cardiovascular risk reduction. -     Lipid panel; Future -     Lipid panel -     rosuvastatin (CRESTOR) 5 MG tablet; Take 1 tablet (5 mg total) by mouth daily.  Thalassemia trait- Her H&H are stable. -     CBC with Differential/Platelet; Future -     CBC with Differential/Platelet  Routine general medical examination at a health care facility- Exam completed, labs reviewed, vaccines are up-to-date, cancer screenings addressed, patient education was given.  Mild intermittent asthma without complication- Will restart the leukotriene inhibitor. -     CBC with Differential/Platelet; Future -     montelukast (SINGULAIR) 10 MG tablet; Take 1 tablet (10 mg total) by mouth at bedtime. -     CBC with Differential/Platelet  Need for shingles vaccine  Chronic cough- Her chest x-ray is negative for mass or infiltrate. -     DG Chest 2 View; Future -     SARS-COV-2 IgG; Future -     SARS-COV-2 IgG  DOE (dyspnea on exertion)- The work-up is unremarkable.  I think her symptoms are related to poorly controlled asthma and a recent COVID-19 infection. -     CBC with Differential/Platelet; Future -     Troponin I (High Sensitivity); Future -     Brain natriuretic peptide; Future -     D-dimer, quantitative; Future -     EKG 12-Lead -     D-dimer, quantitative -     Brain natriuretic peptide -     Troponin I (High Sensitivity) -     CBC with  Differential/Platelet  Colon cancer screening -     Ambulatory referral to Gastroenterology  Bronchitis due to COVID-19 virus- She has presented too late to benefit from treatment.  She is getting adequate symptom relief with over-the-counter remedies.  I am having Derenda Mis. Toney Rakes start on rosuvastatin. I am also having her maintain her loratadine, triamcinolone, Multiple Vitamins-Minerals (CENTRUM SILVER ADULT 50+ PO), Omega-3 Fatty Acids (OMEGA 3 PO), guaiFENesin, sodium chloride, Emollient (RA RENEWAL DRY SKIN THERAPY EX), OVER THE COUNTER MEDICATION, CALCIUM CARB-CHOLECALCIFEROL PO, budesonide, albuterol, albuterol, and montelukast.  Meds ordered this encounter  Medications   montelukast (SINGULAIR) 10 MG tablet    Sig: Take 1 tablet (10 mg total) by mouth at bedtime.    Dispense:  90 tablet    Refill:  1   rosuvastatin (CRESTOR) 5 MG tablet    Sig: Take 1 tablet (5 mg total) by mouth daily.    Dispense:  90 tablet    Refill:  1      Follow-up: Return in about 3 months (around 08/28/2021).  Scarlette Calico, MD

## 2021-05-31 ENCOUNTER — Encounter: Payer: Self-pay | Admitting: Internal Medicine

## 2021-05-31 DIAGNOSIS — U071 COVID-19: Secondary | ICD-10-CM | POA: Insufficient documentation

## 2021-05-31 DIAGNOSIS — J40 Bronchitis, not specified as acute or chronic: Secondary | ICD-10-CM | POA: Insufficient documentation

## 2021-05-31 LAB — LIPID PANEL
Cholesterol: 199 mg/dL (ref 0–200)
HDL: 87.1 mg/dL (ref >39.00)
LDL Cholesterol: 96 mg/dL (ref 0–99)
NonHDL: 112.06
Total CHOL/HDL Ratio: 2
Triglycerides: 82 mg/dL (ref 0.0–149.0)
VLDL: 16.4 mg/dL (ref 0.0–40.0)

## 2021-05-31 LAB — SARS-COV-2 IGG: SARS-COV-2 IgG: 8.97

## 2021-05-31 LAB — TROPONIN I (HIGH SENSITIVITY): High Sens Troponin I: 3 ng/L (ref 2–17)

## 2021-05-31 LAB — BRAIN NATRIURETIC PEPTIDE: Pro B Natriuretic peptide (BNP): 35 pg/mL (ref 0.0–100.0)

## 2021-05-31 MED ORDER — ROSUVASTATIN CALCIUM 5 MG PO TABS
5.0000 mg | ORAL_TABLET | Freq: Every day | ORAL | 1 refills | Status: DC
Start: 2021-05-31 — End: 2021-08-15

## 2021-06-04 ENCOUNTER — Other Ambulatory Visit: Payer: Self-pay

## 2021-06-04 ENCOUNTER — Telehealth (INDEPENDENT_AMBULATORY_CARE_PROVIDER_SITE_OTHER): Payer: Medicare HMO | Admitting: Internal Medicine

## 2021-06-04 DIAGNOSIS — J40 Bronchitis, not specified as acute or chronic: Secondary | ICD-10-CM | POA: Diagnosis not present

## 2021-06-04 DIAGNOSIS — U071 COVID-19: Secondary | ICD-10-CM

## 2021-06-04 MED ORDER — NIRMATRELVIR/RITONAVIR (PAXLOVID)TABLET: 3.0000 | ORAL_TABLET | Freq: Two times a day (BID) | ORAL | 0 refills | Status: AC

## 2021-06-04 MED ORDER — DEXAMETHASONE 6 MG PO TABS: 6.0000 mg | ORAL_TABLET | Freq: Every day | ORAL | 0 refills | Status: DC

## 2021-06-04 NOTE — Progress Notes (Signed)
Patient ID: Jessica Thompson, female   DOB: 02/28/1951, 70 y.o.   MRN: [id number]  Virtual Visit via Video Note  I connected with Gearldine Bienenstock on 06/04/21 at  3:20 PM EST by a video enabled telemedicine application and verified that I am speaking with the correct person using two identifiers.  Location of all participants today Patient: at home Provider: at office   I discussed the limitations of evaluation and management by telemedicine and the availability of in person appointments. The patient expressed understanding and agreed to proceed.  History of Present Illness: Here with c/o covid + dec 8 with symptoms of fever, myalgias, fatigue, sob and head congestion beginning then per pt, but no n/v, diarrhea or prod cough.  Pt denies chest pain, wheezing, orthopnea, PND, increased LE swelling, palpitations, dizziness or syncope.   Pt denies recent other wt loss, night sweats, loss of appetite, or other constitutional symptoms.  Pt is s/p covid vax x 2 and booster x 1.  No prior hx of covid infection.  Unclear where this exposure occurred.  Has hx of chronic cough and doe, but this seems different.   Past Medical History:  Diagnosis Date   Allergy    Anemia    Betathalasemia minor   Asthma    Sinusitis    Past Surgical History:  Procedure Laterality Date   POLYPECTOMY     sinus sugery      reports that she has never smoked. She has never used smokeless tobacco. She reports that she does not drink alcohol and does not use drugs. family history includes Anemia in her mother; Emphysema in her father; Heart disease in her brother; Hyperlipidemia in her mother; Hypertension in her father; Liver disease in her brother. Allergies  Allergen Reactions   Cabbage Other (See Comments)    congestion   Cheese Other (See Comments)    congestion   Corn-Containing Products Other (See Comments)    congestion   Erythromycin Other (See Comments)    Abdominal pain   Peanut-Containing Drug  Products Other (See Comments)    congestion   Soy Allergy Other (See Comments)    congestion   Wheat Bran Other (See Comments)    congestion   Mushroom Extract Complex Hives, Rash and Other (See Comments)    Congestion    Current Outpatient Medications on File Prior to Visit  Medication Sig Dispense Refill   albuterol (PROVENTIL) (2.5 MG/3ML) 0.083% nebulizer solution Take 3 mLs (2.5 mg total) by nebulization every 6 (six) hours as needed for wheezing or shortness of breath. 150 mL 3   albuterol (VENTOLIN HFA) 108 (90 Base) MCG/ACT inhaler INHALE 2 PUFFS BY MOUTH EVERY 6 HOURS AS NEEDED FOR WHEEZE OR SHORTNESS OF BREATH 18 g 3   budesonide (PULMICORT) 180 MCG/ACT inhaler Inhale 1 puff into the lungs every 6 (six) hours as needed (wheezing). 3 each 1   CALCIUM CARB-CHOLECALCIFEROL PO Take 2 tablets by mouth daily. 650mg  / 1000 units (per tablet)     Emollient (RA RENEWAL DRY SKIN THERAPY EX) Apply 1 application topically daily.     guaiFENesin (MUCINEX) 600 MG 12 hr tablet Take 600 mg by mouth daily as needed.     loratadine (CLARITIN) 10 MG tablet Take 10 mg by mouth daily.     montelukast (SINGULAIR) 10 MG tablet Take 1 tablet (10 mg total) by mouth at bedtime. 90 tablet 1   Multiple Vitamins-Minerals (CENTRUM SILVER ADULT 50+ PO) Take 1 tablet by mouth  daily.     Omega-3 Fatty Acids (OMEGA 3 PO) Take 660 mg by mouth daily.     OVER THE COUNTER MEDICATION 2 tablets in the morning and at bedtime. Phytomega - 1 serving = 2 tablets contains: Vitamin C: 10mg   Vitamin E: 4mg  Phytosterol Esters: 1000mg  Fish Oil: 500mg   Alpha lipoic Acid: 15mg   CoQ 10 enzyme: 15mg   Proprietary blend: 95mg      rosuvastatin (CRESTOR) 5 MG tablet Take 1 tablet (5 mg total) by mouth daily. 90 tablet 1   sodium chloride (OCEAN) 0.65 % SOLN nasal spray Place 1 spray into both nostrils as needed for congestion.     triamcinolone (NASACORT) 55 MCG/ACT AERO nasal inhaler Place 2 sprays into the nose daily. Reported  on 06/26/2015     No current facility-administered medications on file prior to visit.    Observations/Objective: Alert, NAD, appropriate mood and affect, resps normal, cn 2-12 intact, moves all 4s, no visible rash or swelling Lab Results  Component Value Date   WBC 4.7 05/30/2021   HGB 10.2 (L) 05/30/2021   HCT 33.7 (L) 05/30/2021   PLT 275.0 05/30/2021   GLUCOSE 78 04/11/2020   CHOL 199 05/30/2021   TRIG 82.0 05/30/2021   HDL 87.10 05/30/2021   LDLCALC 96 05/30/2021   ALT 13 04/11/2020   AST 20 04/11/2020   NA 138 04/11/2020   K 4.7 04/11/2020   CL 103 04/11/2020   CREATININE 0.93 04/11/2020   BUN 17 04/11/2020   CO2 33 (H) 04/11/2020   TSH 1.75 10/10/2020   Assessment and Plan: See notes  Follow Up Instructions: See notes   I discussed the assessment and treatment plan with the patient. The patient was provided an opportunity to ask questions and all were answered. The patient agreed with the plan and demonstrated an understanding of the instructions.   The patient was advised to call back or seek an in-person evaluation if the symptoms worsen or if the condition fails to improve as anticipated.   Cathlean Cower, MD

## 2021-06-09 ENCOUNTER — Encounter: Payer: Self-pay | Admitting: Internal Medicine

## 2021-06-09 NOTE — Assessment & Plan Note (Signed)
Difficult historian and unclear when symptoms began more acute on chronic, will give benefit of the doubt, pt highly interested in paxlovid - ok for trial, cont inhalers prn

## 2021-06-09 NOTE — Patient Instructions (Signed)
Please take all new medication as prescribed 

## 2021-06-18 ENCOUNTER — Encounter: Payer: Self-pay | Admitting: Internal Medicine

## 2021-06-18 NOTE — Telephone Encounter (Signed)
Sorry, I am not aware of any required blood work for post covid evaluation after monoclonal Ab and prednisone

## 2021-06-28 ENCOUNTER — Encounter: Payer: Self-pay | Admitting: Internal Medicine

## 2021-07-11 ENCOUNTER — Encounter: Payer: Self-pay | Admitting: Internal Medicine

## 2021-07-12 ENCOUNTER — Encounter: Payer: Self-pay | Admitting: Internal Medicine

## 2021-07-15 ENCOUNTER — Other Ambulatory Visit: Payer: Self-pay | Admitting: Internal Medicine

## 2021-07-15 DIAGNOSIS — J4520 Mild intermittent asthma, uncomplicated: Secondary | ICD-10-CM

## 2021-07-15 MED ORDER — BUDESONIDE 180 MCG/ACT IN AEPB: 1.0000 | INHALATION_SPRAY | Freq: Four times a day (QID) | RESPIRATORY_TRACT | 1 refills | Status: DC | PRN

## 2021-07-16 ENCOUNTER — Ambulatory Visit
Admission: RE | Admit: 2021-07-16 | Discharge: 2021-07-16 | Disposition: A | Discharge status: Home / Self Care | Payer: Medicare HMO | Source: Ambulatory Visit | Attending: Internal Medicine | Admitting: Internal Medicine

## 2021-07-16 ENCOUNTER — Telehealth: Payer: Self-pay | Admitting: Internal Medicine

## 2021-07-16 DIAGNOSIS — Z1231 Encounter for screening mammogram for malignant neoplasm of breast: Secondary | ICD-10-CM | POA: Diagnosis not present

## 2021-07-16 NOTE — Telephone Encounter (Signed)
Patient states she was to have a release of authorization health information form faxed by Bonnita Nasuti in referral   Attempted to fax the form to the fax number provided by patient, number is invalid  LVM for patient to call office and verify fax number  Fax number provided [phone]

## 2021-07-17 NOTE — Telephone Encounter (Signed)
Pt called back and verified that the number that was enter below is correct.   Fax [phone]  Call [phone] if the fax doesn't transmit.

## 2021-07-21 ENCOUNTER — Other Ambulatory Visit: Payer: Self-pay | Admitting: Internal Medicine

## 2021-07-21 DIAGNOSIS — J4520 Mild intermittent asthma, uncomplicated: Secondary | ICD-10-CM

## 2021-07-21 MED ORDER — ALBUTEROL SULFATE HFA 108 (90 BASE) MCG/ACT IN AERS
1.0000 | INHALATION_SPRAY | Freq: Four times a day (QID) | RESPIRATORY_TRACT | 3 refills | Status: DC | PRN
Start: 2021-07-21 — End: 2021-08-14

## 2021-07-24 ENCOUNTER — Emergency Department (HOSPITAL_COMMUNITY): Payer: Medicare HMO

## 2021-07-24 ENCOUNTER — Observation Stay (HOSPITAL_COMMUNITY)
Admission: EM | Admit: 2021-07-24 | Discharge: 2021-07-26 | Disposition: A | Discharge status: Home / Self Care | Payer: Medicare HMO | Attending: Internal Medicine | Admitting: Internal Medicine

## 2021-07-24 DIAGNOSIS — Z79899 Other long term (current) drug therapy: Secondary | ICD-10-CM | POA: Diagnosis not present

## 2021-07-24 DIAGNOSIS — R062 Wheezing: Secondary | ICD-10-CM | POA: Diagnosis not present

## 2021-07-24 DIAGNOSIS — R0602 Shortness of breath: Secondary | ICD-10-CM | POA: Diagnosis not present

## 2021-07-24 DIAGNOSIS — Z20822 Contact with and (suspected) exposure to covid-19: Secondary | ICD-10-CM | POA: Insufficient documentation

## 2021-07-24 DIAGNOSIS — Z91010 Allergy to peanuts: Secondary | ICD-10-CM | POA: Diagnosis not present

## 2021-07-24 DIAGNOSIS — J4531 Mild persistent asthma with (acute) exacerbation: Secondary | ICD-10-CM | POA: Diagnosis present

## 2021-07-24 DIAGNOSIS — J45901 Unspecified asthma with (acute) exacerbation: Secondary | ICD-10-CM | POA: Diagnosis not present

## 2021-07-24 DIAGNOSIS — R000 Tachycardia, unspecified: Secondary | ICD-10-CM | POA: Diagnosis not present

## 2021-07-24 DIAGNOSIS — J9601 Acute respiratory failure with hypoxia: Secondary | ICD-10-CM | POA: Diagnosis not present

## 2021-07-24 DIAGNOSIS — J45909 Unspecified asthma, uncomplicated: Secondary | ICD-10-CM | POA: Diagnosis not present

## 2021-07-24 DIAGNOSIS — D563 Thalassemia minor: Secondary | ICD-10-CM | POA: Diagnosis present

## 2021-07-24 DIAGNOSIS — E785 Hyperlipidemia, unspecified: Secondary | ICD-10-CM | POA: Diagnosis present

## 2021-07-24 LAB — CBC WITH DIFFERENTIAL/PLATELET
Abs Immature Granulocytes: 0 K/uL (ref 0.00–0.07)
Basophils Absolute: 0 K/uL (ref 0.0–0.1)
Basophils Relative: 0 %
Eosinophils Absolute: 0.4 K/uL (ref 0.0–0.5)
Eosinophils Relative: 5 %
HCT: 34.3 % — ABNORMAL LOW (ref 36.0–46.0)
Hemoglobin: 10.6 g/dL — ABNORMAL LOW (ref 12.0–15.0)
Lymphocytes Relative: 19 %
Lymphs Abs: 1.7 K/uL (ref 0.7–4.0)
MCH: 22.1 pg — ABNORMAL LOW (ref 26.0–34.0)
MCHC: 30.9 g/dL (ref 30.0–36.0)
MCV: 71.5 fL — ABNORMAL LOW (ref 80.0–100.0)
Monocytes Absolute: 0.4 K/uL (ref 0.1–1.0)
Monocytes Relative: 5 %
Neutro Abs: 6.2 K/uL (ref 1.7–7.7)
Neutrophils Relative %: 71 %
Platelets: 288 K/uL (ref 150–400)
RBC: 4.8 MIL/uL (ref 3.87–5.11)
RDW: 17.1 % — ABNORMAL HIGH (ref 11.5–15.5)
WBC: 8.7 K/uL (ref 4.0–10.5)
nRBC: 0 % (ref 0.0–0.2)
nRBC: 0 /100{WBCs}

## 2021-07-24 LAB — BASIC METABOLIC PANEL
Anion gap: 11 (ref 5–15)
BUN: 16 mg/dL (ref 8–23)
CO2: 25 mmol/L (ref 22–32)
Calcium: 9.4 mg/dL (ref 8.9–10.3)
Chloride: 97 mmol/L — ABNORMAL LOW (ref 98–111)
Creatinine, Ser: 0.93 mg/dL (ref 0.44–1.00)
GFR, Estimated: >60 mL/min (ref >60)
Glucose, Bld: 146 mg/dL — ABNORMAL HIGH (ref 70–99)
Potassium: 4 mmol/L (ref 3.5–5.1)
Sodium: 133 mmol/L — ABNORMAL LOW (ref 135–145)

## 2021-07-24 LAB — I-STAT VENOUS BLOOD GAS, ED
Acid-Base Excess: 1 mmol/L (ref 0.0–2.0)
Bicarbonate: 27.9 mmol/L (ref 20.0–28.0)
Calcium, Ion: 1.18 mmol/L (ref 1.15–1.40)
HCT: 33 % — ABNORMAL LOW (ref 36.0–46.0)
Hemoglobin: 11.2 g/dL — ABNORMAL LOW (ref 12.0–15.0)
O2 Saturation: 75 %
Potassium: 3.5 mmol/L (ref 3.5–5.1)
Sodium: 137 mmol/L (ref 135–145)
TCO2: 29 mmol/L (ref 22–32)
pCO2, Ven: 51.6 mmHg (ref 44.0–60.0)
pH, Ven: 7.341 (ref 7.250–7.430)
pO2, Ven: 43 mmHg (ref 32.0–45.0)

## 2021-07-24 LAB — RESP PANEL BY RT-PCR (FLU A&B, COVID) ARPGX2
Influenza A by PCR: NEGATIVE
Influenza B by PCR: NEGATIVE
SARS Coronavirus 2 by RT PCR: NEGATIVE

## 2021-07-24 MED ORDER — MAGNESIUM SULFATE 2 GM/50ML IV SOLN
2.0000 g | Freq: Once | INTRAVENOUS | Status: AC
  Administered 2021-07-24: 2 g via INTRAVENOUS
  Filled 2021-07-24: qty 50

## 2021-07-24 MED ORDER — ALBUTEROL SULFATE (2.5 MG/3ML) 0.083% IN NEBU
20.0000 mg | INHALATION_SOLUTION | RESPIRATORY_TRACT | Status: DC
  Administered 2021-07-24: 20 mg via RESPIRATORY_TRACT
  Filled 2021-07-24: qty 24

## 2021-07-24 MED ORDER — IPRATROPIUM-ALBUTEROL 0.5-2.5 (3) MG/3ML IN SOLN
3.0000 mL | Freq: Once | RESPIRATORY_TRACT | Status: DC
  Filled 2021-07-24: qty 3

## 2021-07-24 MED ORDER — ALBUTEROL (5 MG/ML) CONTINUOUS INHALATION SOLN
20.0000 mg | INHALATION_SOLUTION | RESPIRATORY_TRACT | Status: DC
  Filled 2021-07-24: qty 20

## 2021-07-24 MED ORDER — METHYLPREDNISOLONE SODIUM SUCC 125 MG IJ SOLR
125.0000 mg | Freq: Once | INTRAMUSCULAR | Status: AC
  Administered 2021-07-24: 125 mg via INTRAVENOUS
  Filled 2021-07-24: qty 2

## 2021-07-24 MED ORDER — IPRATROPIUM-ALBUTEROL 0.5-2.5 (3) MG/3ML IN SOLN
3.0000 mL | Freq: Once | RESPIRATORY_TRACT | Status: AC
  Administered 2021-07-24: 3 mL via RESPIRATORY_TRACT
  Filled 2021-07-24: qty 3

## 2021-07-24 MED ORDER — ALBUTEROL SULFATE (2.5 MG/3ML) 0.083% IN NEBU
2.5000 mg | INHALATION_SOLUTION | Freq: Once | RESPIRATORY_TRACT | Status: DC
  Filled 2021-07-24: qty 3

## 2021-07-24 NOTE — ED Provider Triage Note (Signed)
Emergency Medicine Provider Triage Evaluation Note  Jessica Thompson , a 71 y.o. female  was evaluated in triage.  Pt complains of shortness of breath and wheezing.  States that she has asthma.  Symptoms worsening over the past 4 days.  No reported fevers.  Hypoxic on arrival, placed on oxygen.  Review of Systems  Positive: Shortness of breath Negative: Fever  Physical Exam  BP (!) 204/114    Pulse (!) 121    Resp (!) 28    SpO2 (!) 81%  Gen:   Awake, appears uncomfortable Resp:  Increased work of breathing, decreased lung sounds all fields, expiratory wheezing all fields MSK:   Moves extremities without difficulty  Other:    Medical Decision Making  Medically screening exam initiated at 8:09 PM.  Appropriate orders placed.  Jessica Thompson was informed that the remainder of the evaluation will be completed by another provider, this initial triage assessment does not replace that evaluation, and the importance of remaining in the ED until their evaluation is complete.     Carlisle Cater, PA-C 07/24/21 2010

## 2021-07-24 NOTE — ED Triage Notes (Addendum)
Patient arrives POV c/o asthma worsening over last 4 days. Patient states she "can no longer breathe through it". Wheezing audibly

## 2021-07-24 NOTE — ED Provider Notes (Signed)
[facility] EMERGENCY DEPARTMENT Provider Note   CSN: [id number] Arrival date & time: 07/24/21  1948     History  Chief Complaint  Patient presents with   Asthma   Respiratory Distress    Jessica Thompson is a 71 y.o. female.   Asthma Associated symptoms include shortness of breath.   71 year old female presenting to the emergency department with concern for an asthma exacerbation.  She states that she has a history of known asthma and has had a worsening shortness of breath consistent with asthma exacerbation over the last 4 days despite home albuterol treatments.  In triage, the patient was notably hypoxic to 81% on room air, tachypneic to the high 20s, tachycardic, hypertensive with expiratory wheezing and decreased breath sounds in all lung fields.  She was urgently bedded in room 16 and started on continuous albuterol and was administered IV Solu-Medrol and IV magnesium.  Home Medications Prior to Admission medications   Medication Sig Start Date End Date Taking? Authorizing Provider  albuterol (PROVENTIL) (2.5 MG/3ML) 0.083% nebulizer solution Take 3 mLs (2.5 mg total) by nebulization every 6 (six) hours as needed for wheezing or shortness of breath. 03/19/21  Yes Janith Lima, MD  albuterol (VENTOLIN HFA) 108 (90 Base) MCG/ACT inhaler Inhale 1-2 puffs into the lungs every 6 (six) hours as needed for wheezing or shortness of breath. 07/21/21  Yes Janith Lima, MD  budesonide (PULMICORT) 180 MCG/ACT inhaler Inhale 1 puff into the lungs every 6 (six) hours as needed (wheezing). 07/15/21  Yes Janith Lima, MD  CALCIUM CARB-CHOLECALCIFEROL PO Take 2 tablets by mouth daily. 650mg  / 1000 units (per tablet)   Yes [provider]  guaiFENesin (MUCINEX) 600 MG 12 hr tablet Take 600 mg by mouth daily.   Yes [provider]  loratadine (CLARITIN) 10 MG tablet Take 10 mg by mouth daily.   Yes [provider]  montelukast (SINGULAIR) 10  MG tablet Take 1 tablet (10 mg total) by mouth at bedtime. 05/30/21  Yes Janith Lima, MD  Multiple Vitamins-Minerals (CENTRUM SILVER ADULT 50+ PO) Take 1 tablet by mouth daily.   Yes [provider]  rosuvastatin (CRESTOR) 5 MG tablet Take 1 tablet (5 mg total) by mouth daily. 05/31/21  Yes Janith Lima, MD  sodium chloride (OCEAN) 0.65 % SOLN nasal spray Place 2 sprays into both nostrils daily.   Yes [provider]  triamcinolone (NASACORT) 55 MCG/ACT AERO nasal inhaler Place 2 sprays into the nose daily. Reported on 06/26/2015   Yes [provider]      Allergies    Cabbage, Cheese, Corn-containing products, Erythromycin, Peanut-containing drug products, Soy allergy, Wheat bran, and Mushroom extract complex    Review of Systems   Review of Systems  Respiratory:  Positive for shortness of breath and wheezing.   All other systems reviewed and are negative.  Physical Exam Updated Vital Signs BP (!) 155/79    Pulse (!) 108    Temp 98.1 F (36.7 C) (Oral)    Resp 17    SpO2 96%  Physical Exam Vitals and nursing note reviewed.  Constitutional:      General: She is not in acute distress.    Appearance: She is well-developed.  HENT:     Head: Normocephalic and atraumatic.  Eyes:     Conjunctiva/sclera: Conjunctivae normal.     Pupils: Pupils are equal, round, and reactive to light.  Cardiovascular:     Rate and  Rhythm: Normal rate and regular rhythm.     Heart sounds: No murmur heard. Pulmonary:     Effort: Respiratory distress present.     Breath sounds: Wheezing present.  Abdominal:     General: There is no distension.     Palpations: Abdomen is soft.     Tenderness: There is no abdominal tenderness. There is no guarding.  Musculoskeletal:        General: No swelling, deformity or signs of injury.     Cervical back: Neck supple.  Skin:    General: Skin is warm and dry.     Capillary Refill: Capillary refill takes less than 2 seconds.      Findings: No lesion or rash.  Neurological:     General: No focal deficit present.     Mental Status: She is alert. Mental status is at baseline.  Psychiatric:        Mood and Affect: Mood normal.    ED Results / Procedures / Treatments   Labs (all labs ordered are listed, but only abnormal results are displayed) Labs Reviewed  BASIC METABOLIC PANEL - Abnormal; Notable for the following components:      Result Value   Sodium 133 (*)    Chloride 97 (*)    Glucose, Bld 146 (*)    All other components within normal limits  CBC WITH DIFFERENTIAL/PLATELET - Abnormal; Notable for the following components:   Hemoglobin 10.6 (*)    HCT 34.3 (*)    MCV 71.5 (*)    MCH 22.1 (*)    RDW 17.1 (*)    All other components within normal limits  I-STAT VENOUS BLOOD GAS, ED - Abnormal; Notable for the following components:   HCT 33.0 (*)    Hemoglobin 11.2 (*)    All other components within normal limits  RESP PANEL BY RT-PCR (FLU A&B, COVID) [id number]    EKG EKG Interpretation  Date/Time:  Wednesday July 24 2021 20:21:37 EST Ventricular Rate:  122 PR Interval:  144 QRS Duration: 71 QT Interval:  307 QTC Calculation: 438 R Axis:   71 Text Interpretation: Sinus tachycardia Probable left atrial enlargement Confirmed by Regan Lemming ([id number]) on 07/24/2021 8:25:00 PM  Radiology DG Chest Port 1 View  Result Date: 07/24/2021 CLINICAL DATA:  Shortness of breath, asthma, wheezing. EXAM: PORTABLE CHEST 1 VIEW COMPARISON:  05/30/2021. FINDINGS: The heart size and mediastinal contours are within normal limits. Aortic atherosclerosis is noted. No consolidation, effusion, or pneumothorax. No acute osseous abnormality. IMPRESSION: No active disease. Electronically Signed   By: Brett Fairy M.D.   On: 07/24/2021 20:36    Procedures .Critical Care Performed by: Regan Lemming, MD Authorized by: Regan Lemming, MD   Critical care provider statement:    Critical care time (minutes):  30   Critical  care was necessary to treat or prevent imminent or life-threatening deterioration of the following conditions:  Respiratory failure   Critical care was time spent personally by me on the following activities:  Development of treatment plan with patient or surrogate, discussions with consultants, evaluation of patient's response to treatment, examination of patient, ordering and review of laboratory studies, ordering and review of radiographic studies, ordering and performing treatments and interventions, pulse oximetry, re-evaluation of patient's condition and review of old charts    Medications Ordered in ED Medications  albuterol (PROVENTIL) (2.5 MG/3ML) 0.083% nebulizer solution 20 mg (20 mg Nebulization New Bag/Given 07/24/21 2102)  magnesium sulfate IVPB 2 g 50 mL (0 g  Intravenous Stopped 07/24/21 2128)  methylPREDNISolone sodium succinate (SOLU-MEDROL) 125 mg/2 mL injection 125 mg (125 mg Intravenous Given 07/24/21 2020)  ipratropium-albuterol (DUONEB) 0.5-2.5 (3) MG/3ML nebulizer solution 3 mL (3 mLs Nebulization Given 07/24/21 2038)  ipratropium-albuterol (DUONEB) 0.5-2.5 (3) MG/3ML nebulizer solution 3 mL (3 mLs Nebulization Given 07/24/21 2055)    ED Course/ Medical Decision Making/ A&P                           Medical Decision Making Risk Prescription drug management. Decision regarding hospitalization.   71 year old female presenting to the emergency department with concern for an asthma exacerbation.  She states that she has a history of known asthma and has had a worsening shortness of breath consistent with asthma exacerbation over the last 4 days despite home albuterol treatments.  In triage, the patient was notably hypoxic to 81% on room air, tachypneic to the high 20s, tachycardic, hypertensive with expiratory wheezing and decreased breath sounds in all lung fields.  She was urgently bedded in room 16 and started on continuous albuterol and was administered IV Solu-Medrol and IV  magnesium.  This tachycardia was noted on cardiac telemetry.  Concern for acute asthma exacerbation with hypoxic respiratory failure.  The patient was administered DuoNebs x3 and was placed on continuous albuterol nebulizer treatment by respiratory therapy.  She showed signs of improvement throughout her time in the emergency department.  Work-up significant for VBG without acidosis, pH 7.34, PCO2 51.6, HCO3 27.9, CBC without a leukocytosis, mild anemia to 10.6, BMP with mild hyponatremia and hypochloremia, mild hyperglycemia to 133, 97 and 146 respectively, no evidence of AKI, COVID-19 influenza PCR testing resulted negative.  Chest x-ray was performed and was reviewed by myself and radiology and was negative for acute abnormalities.  An EKG revealed sinus tachycardia in the setting of albuterol use with no evidence of ischemic changes.  On reassessment, the patient was significantly improved but given the severity of her initial presentation, the decision was made admit the patient for observation for an asthma exacerbation.  Plan at time of signout to admit the patient to the hospitalist service.  Signout given to Dr. Eulas Post, at [place].  Final Clinical Impression(s) / ED Diagnoses Final diagnoses:  Exacerbation of asthma, unspecified asthma severity, unspecified whether persistent    Rx / DC Orders ED Discharge Orders     None         Regan Lemming, MD 07/25/21 [phone]

## 2021-07-25 DIAGNOSIS — J45901 Unspecified asthma with (acute) exacerbation: Principal | ICD-10-CM

## 2021-07-25 DIAGNOSIS — J9601 Acute respiratory failure with hypoxia: Secondary | ICD-10-CM | POA: Diagnosis present

## 2021-07-25 DIAGNOSIS — J4531 Mild persistent asthma with (acute) exacerbation: Secondary | ICD-10-CM | POA: Diagnosis present

## 2021-07-25 MED ORDER — BUDESONIDE 0.25 MG/2ML IN SUSP
0.2500 mg | Freq: Two times a day (BID) | RESPIRATORY_TRACT | Status: DC
  Administered 2021-07-25 (×2): 0.25 mg via RESPIRATORY_TRACT
  Filled 2021-07-25 (×2): qty 2

## 2021-07-25 MED ORDER — ACETAMINOPHEN 325 MG PO TABS: 650.0000 mg | ORAL_TABLET | Freq: Four times a day (QID) | ORAL | Status: DC | PRN

## 2021-07-25 MED ORDER — PREDNISONE 50 MG PO TABS
60.0000 mg | ORAL_TABLET | Freq: Every day | ORAL | Status: DC
  Administered 2021-07-25 – 2021-07-26 (×2): 60 mg via ORAL
  Filled 2021-07-25: qty 1
  Filled 2021-07-25: qty 3

## 2021-07-25 MED ORDER — BUDESONIDE 0.5 MG/2ML IN SUSP
0.5000 mg | Freq: Two times a day (BID) | RESPIRATORY_TRACT | Status: DC
  Administered 2021-07-26 (×2): 0.5 mg via RESPIRATORY_TRACT
  Filled 2021-07-25 (×2): qty 2

## 2021-07-25 MED ORDER — IPRATROPIUM-ALBUTEROL 0.5-2.5 (3) MG/3ML IN SOLN
3.0000 mL | Freq: Three times a day (TID) | RESPIRATORY_TRACT | Status: DC
  Administered 2021-07-25 (×2): 3 mL via RESPIRATORY_TRACT
  Filled 2021-07-25 (×2): qty 3

## 2021-07-25 MED ORDER — MONTELUKAST SODIUM 10 MG PO TABS
10.0000 mg | ORAL_TABLET | Freq: Every day | ORAL | Status: DC
  Administered 2021-07-25: 10 mg via ORAL
  Filled 2021-07-25 (×2): qty 1

## 2021-07-25 MED ORDER — ROSUVASTATIN CALCIUM 5 MG PO TABS
5.0000 mg | ORAL_TABLET | Freq: Every day | ORAL | Status: DC
  Administered 2021-07-25 – 2021-07-26 (×2): 5 mg via ORAL
  Filled 2021-07-25 (×2): qty 1

## 2021-07-25 MED ORDER — ENOXAPARIN SODIUM 40 MG/0.4ML IJ SOSY
40.0000 mg | PREFILLED_SYRINGE | INTRAMUSCULAR | Status: DC
  Filled 2021-07-25: qty 0.4

## 2021-07-25 MED ORDER — HYDRALAZINE HCL 20 MG/ML IJ SOLN: 10.0000 mg | Freq: Four times a day (QID) | INTRAMUSCULAR | Status: DC | PRN

## 2021-07-25 MED ORDER — IPRATROPIUM-ALBUTEROL 0.5-2.5 (3) MG/3ML IN SOLN
3.0000 mL | Freq: Four times a day (QID) | RESPIRATORY_TRACT | Status: DC
  Administered 2021-07-26: 3 mL via RESPIRATORY_TRACT
  Filled 2021-07-25: qty 3

## 2021-07-25 MED ORDER — ACETAMINOPHEN 650 MG RE SUPP: 650.0000 mg | Freq: Four times a day (QID) | RECTAL | Status: DC | PRN

## 2021-07-25 MED ORDER — ALBUTEROL SULFATE (2.5 MG/3ML) 0.083% IN NEBU: 2.5000 mg | INHALATION_SOLUTION | RESPIRATORY_TRACT | Status: DC | PRN

## 2021-07-25 NOTE — Assessment & Plan Note (Addendum)
Likely triggered by cold weather and also ran out of Pulmicort 2 weeks ago.  Initially hypoxic to 81% on room air.  COVID and influenza PCR negative.  Chest x-ray showing no active disease.  Work of breathing has now significantly improved after treatments in the ED. VBG without evidence of acidosis or hypercapnia.  Lungs currently clear on exam and satting 99% on room air.  Not wheezing, tachycardic, or tachypneic. -Prednisone 60 mg daily -DuoNeb every 8 hours -Albuterol nebulizer every 2 hours as needed -Pulmicort neb twice daily -Continue Singulair -Continuous pulse ox, supplemental oxygen as needed to keep oxygen saturation above 92%

## 2021-07-25 NOTE — TOC Progression Note (Signed)
Transition of Care [facility]) - Progression Note    Patient Details  Name: CHERRY TURLINGTON MRN: [id number] Date of Birth: 05/23/1951  [facility] CM/SW Contact  Zenon Mayo, RN Phone Number: 07/25/2021, 4:42 PM  Clinical Narrative:     Transition of Care [facility]) Screening Note   Patient Details  Name: LASHANDA STORLIE Date of Birth: 04-29-1951   Transition of Care [facility]) CM/SW Contact:    Zenon Mayo, RN Phone Number: 07/25/2021, 4:42 PM    Transition of Care Department [facility]) has reviewed patient and no TOC needs have been identified at this time. We will continue to monitor patient advancement through interdisciplinary progression rounds. If new patient transition needs arise, please place a TOC consult.          Expected Discharge Plan and Services                                                 Social Determinants of Health (SDOH) Interventions    Readmission Risk Interventions No flowsheet data found.

## 2021-07-25 NOTE — ED Notes (Signed)
Jessica Thompson daughter [phone] requesting an update on the patient

## 2021-07-25 NOTE — Assessment & Plan Note (Signed)
Hemoglobin stable 

## 2021-07-25 NOTE — Assessment & Plan Note (Signed)
Continue Crestor 

## 2021-07-25 NOTE — Progress Notes (Signed)
Brief same day note: Patient is a 71 year old female with history of mild intermittent asthma, chronic cough, hyperlipidemia, thalassemia trait who presented here with shortness of breath, wheezing.  On presentation she was hypoxic on room air, tachypneic, tachycardic, hypertensive.  Found to have expiratory wheezing and decreased breath sounds.  She was treated with DuoNeb, IV Solu-Medrol, IV magnesium.  She was admitted for the management of asthma exacerbation.  Chest x-ray did not show any pneumonia. Patient seen and examined at the bedside this afternoon.  She was hemodynamically stable during my evaluation, on room air maintaining her saturation.  She was mildly hypertensive. Chest auscultation did not reveal any wheezes but had diminished breath sounds bilaterally.  She was not coughing and was speaking in full sentences. We will continue current management for today.  We will consider discharging her to home tomorrow with oral steroids. We will monitor her blood pressure, if she remains persistently hypertensive, will start on antihypertensives

## 2021-07-25 NOTE — Assessment & Plan Note (Signed)
See assessment and plan under acute asthma exacerbation.

## 2021-07-25 NOTE — H&P (Signed)
History and Physical    Patient: Jessica Thompson BLT:[id number] DOB: 23-Aug-1950 DOA: 07/24/2021 DOS: the patient was seen and examined on 07/25/2021 PCP: Janith Lima, MD  Patient coming from: Home  Chief Complaint:  Chief Complaint  Patient presents with   Asthma   Respiratory Distress    HPI: Jessica Thompson is a 71 y.o. female with medical history significant of mild intermittent asthma, chronic cough, COVID infection in December 2022, hyperlipidemia, thalassemia trait presented to the ED for evaluation of shortness of breath and wheezing x4 days.  On arrival to the ED, patient was hypoxic to 81% on room air, tachypneic with respiratory rate in the high 20s, tachycardic, and hypertensive with expiratory wheezing and decreased breath sounds in all lung fields.  She was started given DuoNeb x2 and started on on continuous albuterol nebulizer treatment.  In addition, was given IV Solu-Medrol 125 mg and IV magnesium 2 g.  Labs showing WBC 8.7, hemoglobin 10.6 (stable), platelet count 288k.  Sodium 133, potassium 4.0, chloride 97, bicarb 25, BUN 16, creatinine 0.9, glucose 146.  COVID and influenza PCR negative.  VBG showing pH 7.34, PCO2 51.6.  Chest x-ray showing no active disease.  Patient states her asthma has been quite well controlled until 4 days ago when she started having shortness of breath and wheezing.  She ran out of Pulmicort inhaler 2 weeks ago.  She was using albuterol inhaler every 4 hours and also albuterol nebulizer machine for the past 4 days but symptoms continued to worsen.  She was coughing only after breathing treatments but not otherwise.  Denies fevers or chest pain.  States she was last hospitalized for asthma exacerbation in the 1980s.  States she normally uses Pulmicort every day and requires albuterol inhaler only rarely.  States her asthma is usually triggered by stress and cold weather.  She was quite stressed as she ran out of Pulmicort inhaler.  Patient states  her breathing has now significantly improved after treatments in the ED.  Review of Systems: As mentioned in the history of present illness. All other systems reviewed and are negative. Past Medical History:  Diagnosis Date   Allergy    Anemia    Betathalasemia minor   Asthma    Sinusitis    Past Surgical History:  Procedure Laterality Date   POLYPECTOMY     sinus sugery     Social History:  reports that she has never smoked. She has never used smokeless tobacco. She reports that she does not drink alcohol and does not use drugs.  Allergies  Allergen Reactions   Cabbage Other (See Comments)    congestion   Cheese Other (See Comments)    congestion   Corn-Containing Products Other (See Comments)    congestion   Erythromycin Other (See Comments)    Abdominal pain   Peanut-Containing Drug Products Other (See Comments)    congestion   Soy Allergy Other (See Comments)    congestion   Wheat Bran Other (See Comments)    congestion   Mushroom Extract Complex Hives, Rash and Other (See Comments)    Congestion     Family History  Problem Relation Age of Onset   Hyperlipidemia Mother    Anemia Mother    Hypertension Father    Emphysema Father    Heart disease Brother    Liver disease Brother     Prior to Admission medications   Medication Sig Start Date End Date Taking? Authorizing Provider  albuterol (  PROVENTIL) (2.5 MG/3ML) 0.083% nebulizer solution Take 3 mLs (2.5 mg total) by nebulization every 6 (six) hours as needed for wheezing or shortness of breath. 03/19/21  Yes Janith Lima, MD  albuterol (VENTOLIN HFA) 108 (90 Base) MCG/ACT inhaler Inhale 1-2 puffs into the lungs every 6 (six) hours as needed for wheezing or shortness of breath. 07/21/21  Yes Janith Lima, MD  budesonide (PULMICORT) 180 MCG/ACT inhaler Inhale 1 puff into the lungs every 6 (six) hours as needed (wheezing). 07/15/21  Yes Janith Lima, MD  CALCIUM CARB-CHOLECALCIFEROL PO Take 2 tablets by  mouth daily. 650mg  / 1000 units (per tablet)   Yes [provider]  guaiFENesin (MUCINEX) 600 MG 12 hr tablet Take 600 mg by mouth daily.   Yes [provider]  loratadine (CLARITIN) 10 MG tablet Take 10 mg by mouth daily.   Yes [provider]  montelukast (SINGULAIR) 10 MG tablet Take 1 tablet (10 mg total) by mouth at bedtime. 05/30/21  Yes Janith Lima, MD  Multiple Vitamins-Minerals (CENTRUM SILVER ADULT 50+ PO) Take 1 tablet by mouth daily.   Yes [provider]  rosuvastatin (CRESTOR) 5 MG tablet Take 1 tablet (5 mg total) by mouth daily. 05/31/21  Yes Janith Lima, MD  sodium chloride (OCEAN) 0.65 % SOLN nasal spray Place 2 sprays into both nostrils daily.   Yes [provider]  triamcinolone (NASACORT) 55 MCG/ACT AERO nasal inhaler Place 2 sprays into the nose daily. Reported on 06/26/2015   Yes [provider]    Physical Exam: Vitals:   07/25/21 0100 07/25/21 0230 07/25/21 0245 07/25/21 0300  BP: 115/63 129/65 123/75 (!) 142/74  Pulse: 92 88 91 87  Resp: 12 12 16 13   Temp:      TempSrc:      SpO2: 96% 99% 99% 99%   Physical Exam Constitutional:      General: She is not in acute distress. HENT:     Head: Normocephalic and atraumatic.  Eyes:     Extraocular Movements: Extraocular movements intact.     Conjunctiva/sclera: Conjunctivae normal.  Cardiovascular:     Rate and Rhythm: Normal rate and regular rhythm.     Pulses: Normal pulses.  Pulmonary:     Effort: Pulmonary effort is normal. No respiratory distress.     Breath sounds: Normal breath sounds. No wheezing or rales.  Abdominal:     General: Bowel sounds are normal. There is no distension.     Palpations: Abdomen is soft.     Tenderness: There is no abdominal tenderness.  Musculoskeletal:        General: No swelling or tenderness.     Cervical back: Normal range of motion and neck supple.  Skin:    General: Skin is warm and dry.  Neurological:      General: No focal deficit present.     Mental Status: She is alert and oriented to person, place, and time.     Data Reviewed:  Labs and imaging reviewed (see HPI).  EKG: Independently reviewed.  Sinus tachycardia.  Assessment and Plan: * Acute asthma exacerbation- (present on admission) Likely triggered by cold weather and also ran out of Pulmicort 2 weeks ago.  Initially hypoxic to 81% on room air.  COVID and influenza PCR negative.  Chest x-ray showing no active disease.  Work of breathing has now significantly improved after treatments in the ED. VBG without evidence of acidosis or hypercapnia.  Lungs currently clear on  exam and satting 99% on room air.  Not wheezing, tachycardic, or tachypneic. -Prednisone 60 mg daily -DuoNeb every 8 hours -Albuterol nebulizer every 2 hours as needed -Pulmicort neb twice daily -Continue Singulair -Continuous pulse ox, supplemental oxygen as needed to keep oxygen saturation above 92%  Acute respiratory failure with hypoxia ([place])- (present on admission) See assessment and plan under acute asthma exacerbation.  HLD (hyperlipidemia)- (present on admission) -Continue Crestor  Thalassemia trait- (present on admission) Hemoglobin stable.   Advance Care Planning:   Code Status: DNR.  Discussed with the patient.  Consults: None  Family Communication: No family available at this time.  Severity of Illness: The appropriate patient status for this patient is OBSERVATION. Observation status is judged to be reasonable and necessary in order to provide the required intensity of service to ensure the patient's safety. The patient's presenting symptoms, physical exam findings, and initial radiographic and laboratory data in the context of their medical condition is felt to place them at decreased risk for further clinical deterioration. Furthermore, it is anticipated that the patient will be medically stable for discharge from the hospital within 2 midnights  of admission.   Author: Shela Leff, MD 07/25/2021 3:41 AM  For on call review [email or web address].

## 2021-07-26 DIAGNOSIS — J45901 Unspecified asthma with (acute) exacerbation: Secondary | ICD-10-CM | POA: Diagnosis not present

## 2021-07-26 MED ORDER — GUAIFENESIN-DM 100-10 MG/5ML PO SYRP: 5.0000 mL | ORAL_SOLUTION | ORAL | 0 refills | Status: DC | PRN

## 2021-07-26 MED ORDER — IPRATROPIUM-ALBUTEROL 0.5-2.5 (3) MG/3ML IN SOLN: 3.0000 mL | Freq: Four times a day (QID) | RESPIRATORY_TRACT | 1 refills | Status: DC | PRN

## 2021-07-26 MED ORDER — PREDNISONE 20 MG PO TABS: 40.0000 mg | ORAL_TABLET | Freq: Every day | ORAL | 0 refills | Status: AC

## 2021-07-26 NOTE — TOC Transition Note (Signed)
Transition of Care [facility]) - CM/SW Discharge Note   Patient Details  Name: Jessica Thompson MRN: [id number] Date of Birth: [birth year]  [facility] CM/SW Contact:  Zenon Mayo, RN Phone Number: 07/26/2021, 10:06 AM   Clinical Narrative:    Patient is for dc today, she has no needs. She states she will have transport.          Patient Goals and CMS Choice        Discharge Placement                       Discharge Plan and Services                                     Social Determinants of Health (SDOH) Interventions     Readmission Risk Interventions No flowsheet data found.

## 2021-07-26 NOTE — Care Management Obs Status (Signed)
[facility] NOTIFICATION   Patient Details  Name: LAI HENDRIKS MRN: [id number] Date of Birth: 04-26-1951   Medicare Observation Status Notification Given:  Yes    Zenon Mayo, RN 07/26/2021, 10:02 AM

## 2021-07-26 NOTE — Progress Notes (Signed)
Pt discharged home in her own car. She is alert and oriented x 4. IV was removed and pt was talking pleasantly with staff upon leaving the unit. Denies pain and denies any discomfort or pain.

## 2021-07-26 NOTE — Discharge Summary (Signed)
Physician Discharge Summary  Jessica Thompson NFA:[id number] DOB: 28-Jan-1951 DOA: 07/24/2021  PCP: Janith Lima, MD  Admit date: 07/24/2021 Discharge date: 07/26/2021  Admitted From: Home Disposition:  Home  Discharge Condition:Stable CODE STATUS:FULL Diet recommendation: Heart Healthy   Brief/Interim Summary:  Patient is a 71 year old female with history of mild intermittent asthma, chronic cough, hyperlipidemia, thalassemia trait who presented here with shortness of breath, wheezing.  On presentation she was hypoxic on room air, tachypneic, tachycardic, hypertensive.  Found to have expiratory wheezing and decreased breath sounds.  She was treated with DuoNeb, IV Solu-Medrol, IV magnesium.  She was admitted for the management of asthma exacerbation.  Chest x-ray did not show any pneumonia. Patient's hospital course remained stable.  Her wheezing, shortness of breath resolved after starting on steroids.  Today she is on room air.  She is medically stable for discharge home today with oral steroids.  She will continue bronchodilators that she was taking at home.  Following problems were addressed during her hospitalization:  Acute asthma exacerbation/acute hypoxic respiratory failure: Has history of mild intermittent asthma.  Hypoxia has resolved, she is currently on room air. Continue prednisone for 3 more days.  She will continue albuterol and Pulmicort inhalers at home.  Prescribed DuoNeb as needed  History of hyperlipidemia: Continue Crestor  History of thalassemia trait: Currently hemoglobin stable    Discharge Diagnoses:  Principal Problem:   Acute asthma exacerbation Active Problems:   Thalassemia trait   HLD (hyperlipidemia)   Acute respiratory failure with hypoxia [facility])    Discharge Instructions  Discharge Instructions     Diet - low sodium heart healthy   Complete by: As directed    Discharge instructions   Complete by: As directed    1)Please take prescribed  medications as instructed 2)Follow up with your PCP in a week   Increase activity slowly   Complete by: As directed       Allergies as of 07/26/2021       Reactions   Cabbage Other (See Comments)   congestion   Cheese Other (See Comments)   congestion   Corn-containing Products Other (See Comments)   congestion   Erythromycin Other (See Comments)   Abdominal pain   Peanut-containing Drug Products Other (See Comments)   congestion   Soy Allergy Other (See Comments)   congestion   Wheat Bran Other (See Comments)   congestion   Mushroom Extract Complex Hives, Rash, Other (See Comments)   Congestion         Medication List     TAKE these medications    albuterol 108 (90 Base) MCG/ACT inhaler Commonly known as: VENTOLIN HFA Inhale 1-2 puffs into the lungs every 6 (six) hours as needed for wheezing or shortness of breath. What changed: Another medication with the same name was removed. Continue taking this medication, and follow the directions you see here.   budesonide 180 MCG/ACT inhaler Commonly known as: PULMICORT Inhale 1 puff into the lungs every 6 (six) hours as needed (wheezing).   CALCIUM CARB-CHOLECALCIFEROL PO Take 2 tablets by mouth daily. 650mg  / 1000 units (per tablet)   CENTRUM SILVER ADULT 50+ PO Take 1 tablet by mouth daily.   guaiFENesin 600 MG 12 hr tablet Commonly known as: MUCINEX Take 600 mg by mouth daily.   guaiFENesin-dextromethorphan 100-10 MG/5ML syrup Commonly known as: ROBITUSSIN DM Take 5 mLs by mouth every 4 (four) hours as needed for cough.   ipratropium-albuterol 0.5-2.5 (3) MG/3ML Soln Commonly known  as: DUONEB Take 3 mLs by nebulization every 6 (six) hours as needed.   loratadine 10 MG tablet Commonly known as: CLARITIN Take 10 mg by mouth daily.   montelukast 10 MG tablet Commonly known as: SINGULAIR Take 1 tablet (10 mg total) by mouth at bedtime.   predniSONE 20 MG tablet Commonly known as: DELTASONE Take 2 tablets  (40 mg total) by mouth daily for 3 days. Start taking on: July 27, 2021   rosuvastatin 5 MG tablet Commonly known as: Crestor Take 1 tablet (5 mg total) by mouth daily.   sodium chloride 0.65 % Soln nasal spray Commonly known as: OCEAN Place 2 sprays into both nostrils daily.   triamcinolone 55 MCG/ACT Aero nasal inhaler Commonly known as: NASACORT Place 2 sprays into the nose daily. Reported on 06/26/2015        Follow-up Information     Janith Lima, MD. Schedule an appointment as soon as possible for a visit in 1 week(s).   Specialty: Internal Medicine Contact information: [place] [address] [phone]                Allergies  Allergen Reactions   Cabbage Other (See Comments)    congestion   Cheese Other (See Comments)    congestion   Corn-Containing Products Other (See Comments)    congestion   Erythromycin Other (See Comments)    Abdominal pain   Peanut-Containing Drug Products Other (See Comments)    congestion   Soy Allergy Other (See Comments)    congestion   Wheat Bran Other (See Comments)    congestion   Mushroom Extract Complex Hives, Rash and Other (See Comments)    Congestion     Consultations: None   Procedures/Studies: DG Chest Port 1 View  Result Date: 07/24/2021 CLINICAL DATA:  Shortness of breath, asthma, wheezing. EXAM: PORTABLE CHEST 1 VIEW COMPARISON:  05/30/2021. FINDINGS: The heart size and mediastinal contours are within normal limits. Aortic atherosclerosis is noted. No consolidation, effusion, or pneumothorax. No acute osseous abnormality. IMPRESSION: No active disease. Electronically Signed   By: Brett Fairy M.D.   On: 07/24/2021 20:36   MM 3D SCREEN BREAST BILATERAL  Result Date: 07/16/2021 CLINICAL DATA:  Screening. EXAM: DIGITAL SCREENING BILATERAL MAMMOGRAM WITH TOMOSYNTHESIS AND CAD TECHNIQUE: Bilateral screening digital craniocaudal and mediolateral oblique mammograms were obtained.  Bilateral screening digital breast tomosynthesis was performed. The images were evaluated with computer-aided detection. COMPARISON:  Previous exam(s). ACR Breast Density Category b: There are scattered areas of fibroglandular density. FINDINGS: There are no findings suspicious for malignancy. IMPRESSION: No mammographic evidence of malignancy. A result letter of this screening mammogram will be mailed directly to the patient. RECOMMENDATION: Screening mammogram in one year. (Code:SM-B-01Y) BI-RADS CATEGORY  1: Negative. Electronically Signed   By: Abelardo Diesel M.D.   On: 07/16/2021 12:07      Subjective: Patient seen and examined at bedside this morning.  Hemodynamically stable for discharge today  Discharge Exam: Vitals:   07/26/21 0423 07/26/21 0738  BP: (!) 130/93 138/77  Pulse: 71 85  Resp: 18 18  Temp: 97.7 F (36.5 C) (!) 97.5 F (36.4 C)  SpO2: 95% 96%   Vitals:   07/26/21 0004 07/26/21 0006 07/26/21 0423 07/26/21 0738  BP: (!) 145/72  (!) 130/93 138/77  Pulse: 87  71 85  Resp: 19  18 18   Temp: 98.6 F (37 C)  97.7 F (36.5 C) (!) 97.5 F (36.4 C)  TempSrc: Oral  Oral Oral  SpO2: 97% 97% 95% 96%  Weight:   52.2 kg     General: Pt is alert, awake, not in acute distress Cardiovascular: RRR, S1/S2 +, no rubs, no gallops Respiratory: CTA bilaterally, no wheezing, no rhonchi Abdominal: Soft, NT, ND, bowel sounds + Extremities: no edema, no cyanosis    The results of significant diagnostics from this hospitalization (including imaging, microbiology, ancillary and laboratory) are listed below for reference.     Microbiology: Recent Results (from the past 240 hour(s))  Resp Panel by RT-PCR (Flu A&B, Covid) Nasopharyngeal Swab     Status: None   Collection Time: 07/24/21  8:12 PM   Specimen: Nasopharyngeal Swab; Nasopharyngeal(NP) swabs in vial transport medium  Result Value Ref Range Status   SARS Coronavirus 2 by RT PCR NEGATIVE NEGATIVE Final    Comment:  (NOTE) SARS-CoV-2 target nucleic acids are NOT DETECTED.  The SARS-CoV-2 RNA is generally detectable in upper respiratory specimens during the acute phase of infection. The lowest concentration of SARS-CoV-2 viral copies this assay can detect is 138 copies/mL. A negative result does not preclude SARS-Cov-2 infection and should not be used as the sole basis for treatment or other patient management decisions. A negative result may occur with  improper specimen collection/handling, submission of specimen other than nasopharyngeal swab, presence of viral mutation(s) within the areas targeted by this assay, and inadequate number of viral copies(<138 copies/mL). A negative result must be combined with clinical observations, patient history, and epidemiological information. The expected result is Negative.  Fact Sheet for Patients:  EntrepreneurPulse.com.au  Fact Sheet for Healthcare Providers:  IncredibleEmployment.be  This test is no t yet approved or cleared by the [place] FDA and  has been authorized for detection and/or diagnosis of SARS-CoV-2 by FDA under an Emergency Use Authorization (EUA). This EUA will remain  in effect (meaning this test can be used) for the duration of the COVID-19 declaration under Section 564(b)(1) of the Act, 21 U.S.C.section 360bbb-3(b)(1), unless the authorization is terminated  or revoked sooner.       Influenza A by PCR NEGATIVE NEGATIVE Final   Influenza B by PCR NEGATIVE NEGATIVE Final    Comment: (NOTE) The Xpert Xpress SARS-CoV-2/FLU/RSV plus assay is intended as an aid in the diagnosis of influenza from Nasopharyngeal swab specimens and should not be used as a sole basis for treatment. Nasal washings and aspirates are unacceptable for Xpert Xpress SARS-CoV-2/FLU/RSV testing.  Fact Sheet for Patients: EntrepreneurPulse.com.au  Fact Sheet for Healthcare  Providers: IncredibleEmployment.be  This test is not yet approved or cleared by the [place] FDA and has been authorized for detection and/or diagnosis of SARS-CoV-2 by FDA under an Emergency Use Authorization (EUA). This EUA will remain in effect (meaning this test can be used) for the duration of the COVID-19 declaration under Section 564(b)(1) of the Act, 21 U.S.C. section 360bbb-3(b)(1), unless the authorization is terminated or revoked.  Performed at [facility], [place] [address] [place], [place] [address]      Labs: BNP (last 3 results) No results for input(s): BNP in the last 8760 hours. Basic Metabolic Panel: Recent Labs  Lab 07/24/21 1948 07/24/21 2138  NA 133* 137  K 4.0 3.5  CL 97*  --   CO2 25  --   GLUCOSE 146*  --   BUN 16  --   CREATININE 0.93  --   CALCIUM 9.4  --    Liver Function Tests: No results for input(s): AST, ALT,  ALKPHOS, BILITOT, PROT, ALBUMIN in the last 168 hours. No results for input(s): LIPASE, AMYLASE in the last 168 hours. No results for input(s): AMMONIA in the last 168 hours. CBC: Recent Labs  Lab 07/24/21 1948 07/24/21 2138  WBC 8.7  --   NEUTROABS 6.2  --   HGB 10.6* 11.2*  HCT 34.3* 33.0*  MCV 71.5*  --   PLT 288  --    Cardiac Enzymes: No results for input(s): CKTOTAL, CKMB, CKMBINDEX, TROPONINI in the last 168 hours. BNP: Invalid input(s): POCBNP CBG: No results for input(s): GLUCAP in the last 168 hours. D-Dimer No results for input(s): DDIMER in the last 72 hours. Hgb A1c No results for input(s): HGBA1C in the last 72 hours. Lipid Profile No results for input(s): CHOL, HDL, LDLCALC, TRIG, CHOLHDL, LDLDIRECT in the last 72 hours. Thyroid function studies No results for input(s): TSH, T4TOTAL, T3FREE, THYROIDAB in the last 72 hours.  Invalid input(s): FREET3 Anemia work up No results for input(s): VITAMINB12, FOLATE, FERRITIN, TIBC, IRON, RETICCTPCT in the last 72  hours. Urinalysis No results found for: COLORURINE, APPEARANCEUR, [place], [place], GLUCOSEU, [place], [place], [employer], PROTEINUR, UROBILINOGEN, NITRITE, LEUKOCYTESUR Sepsis Labs Invalid input(s): PROCALCITONIN,  WBC,  LACTICIDVEN Microbiology Recent Results (from the past 240 hour(s))  Resp Panel by RT-PCR (Flu A&B, Covid) Nasopharyngeal Swab     Status: None   Collection Time: 07/24/21  8:12 PM   Specimen: Nasopharyngeal Swab; Nasopharyngeal(NP) swabs in vial transport medium  Result Value Ref Range Status   SARS Coronavirus 2 by RT PCR NEGATIVE NEGATIVE Final    Comment: (NOTE) SARS-CoV-2 target nucleic acids are NOT DETECTED.  The SARS-CoV-2 RNA is generally detectable in upper respiratory specimens during the acute phase of infection. The lowest concentration of SARS-CoV-2 viral copies this assay can detect is 138 copies/mL. A negative result does not preclude SARS-Cov-2 infection and should not be used as the sole basis for treatment or other patient management decisions. A negative result may occur with  improper specimen collection/handling, submission of specimen other than nasopharyngeal swab, presence of viral mutation(s) within the areas targeted by this assay, and inadequate number of viral copies(<138 copies/mL). A negative result must be combined with clinical observations, patient history, and epidemiological information. The expected result is Negative.  Fact Sheet for Patients:  EntrepreneurPulse.com.au  Fact Sheet for Healthcare Providers:  IncredibleEmployment.be  This test is no t yet approved or cleared by the [place] FDA and  has been authorized for detection and/or diagnosis of SARS-CoV-2 by FDA under an Emergency Use Authorization (EUA). This EUA will remain  in effect (meaning this test can be used) for the duration of the COVID-19 declaration under Section 564(b)(1) of the Act, 21 U.S.C.section  360bbb-3(b)(1), unless the authorization is terminated  or revoked sooner.       Influenza A by PCR NEGATIVE NEGATIVE Final   Influenza B by PCR NEGATIVE NEGATIVE Final    Comment: (NOTE) The Xpert Xpress SARS-CoV-2/FLU/RSV plus assay is intended as an aid in the diagnosis of influenza from Nasopharyngeal swab specimens and should not be used as a sole basis for treatment. Nasal washings and aspirates are unacceptable for Xpert Xpress SARS-CoV-2/FLU/RSV testing.  Fact Sheet for Patients: EntrepreneurPulse.com.au  Fact Sheet for Healthcare Providers: IncredibleEmployment.be  This test is not yet approved or cleared by the [place] FDA and has been authorized for detection and/or diagnosis of SARS-CoV-2 by FDA under an Emergency Use Authorization (EUA). This EUA will remain in effect (meaning this test  can be used) for the duration of the COVID-19 declaration under Section 564(b)(1) of the Act, 21 U.S.C. section 360bbb-3(b)(1), unless the authorization is terminated or revoked.  Performed at [facility], [place] [address], [place], [place] [address]     Please note: You were cared for by a hospitalist during your hospital stay. Once you are discharged, your primary care physician will handle any further medical issues. Please note that NO REFILLS for any discharge medications will be authorized once you are discharged, as it is imperative that you return to your primary care physician (or establish a relationship with a primary care physician if you do not have one) for your post hospital discharge needs so that they can reassess your need for medications and monitor your lab values.    Time coordinating discharge: 40 minutes  SIGNED:   Shelly Coss, MD  [facility] 07/26/2021, 10:11 AM Pager [phone]  If 7PM-7AM, please contact night-coverage [email or web address] Password [email or web address]

## 2021-07-26 NOTE — Plan of Care (Signed)
°  Problem: Respiratory: Goal: Respiratory status will improve Outcome: Progressing Goal: Will regain and/or maintain adequate ventilation Outcome: Progressing Goal: Diagnostic test results will improve Outcome: Progressing Goal: Identification of resources available to assist in meeting health care needs will improve Outcome: Progressing

## 2021-07-26 NOTE — Plan of Care (Signed)
Problem: Respiratory: Goal: Respiratory status will improve 07/26/2021 1126 by Jessica Mitchell, RN Outcome: Completed/Met 07/26/2021 1126 by Jessica Mitchell, RN Outcome: Progressing Goal: Will regain and/or maintain adequate ventilation 07/26/2021 1126 by Jessica Mitchell, RN Outcome: Completed/Met 07/26/2021 1126 by Jessica Mitchell, RN Outcome: Progressing Goal: Diagnostic test results will improve 07/26/2021 1126 by Jessica Mitchell, RN Outcome: Completed/Met 07/26/2021 1126 by Jessica Mitchell, RN Outcome: Progressing Goal: Identification of resources available to assist in meeting health care needs will improve 07/26/2021 1126 by Jessica Mitchell, RN Outcome: Completed/Met 07/26/2021 1126 by Jessica Mitchell, RN Outcome: Progressing   Problem: Respiratory: Goal: Respiratory status will improve 07/26/2021 1126 by Jessica Mitchell, RN Outcome: Completed/Met 07/26/2021 1126 by Jessica Mitchell, RN Outcome: Progressing Goal: Will regain and/or maintain adequate ventilation 07/26/2021 1126 by Jessica Mitchell, RN Outcome: Completed/Met 07/26/2021 1126 by Jessica Mitchell, RN Outcome: Progressing Goal: Diagnostic test results will improve 07/26/2021 1126 by Jessica Mitchell, RN Outcome: Completed/Met 07/26/2021 1126 by Jessica Mitchell, RN Outcome: Progressing Goal: Identification of resources available to assist in meeting health care needs will improve 07/26/2021 1126 by Jessica Mitchell, RN Outcome: Completed/Met 07/26/2021 1126 by Jessica Mitchell, RN Outcome: Progressing

## 2021-07-26 NOTE — Discharge Instructions (Signed)
Patient discharged home with car her own car. NT Terisa Starr took her down in wheelchair.

## 2021-08-14 ENCOUNTER — Encounter: Payer: Self-pay | Admitting: Internal Medicine

## 2021-08-14 ENCOUNTER — Ambulatory Visit (INDEPENDENT_AMBULATORY_CARE_PROVIDER_SITE_OTHER): Payer: Medicare HMO | Admitting: Internal Medicine

## 2021-08-14 ENCOUNTER — Other Ambulatory Visit: Payer: Self-pay

## 2021-08-14 VITALS — BP 138/82 | HR 78 | Temp 98.8°F | Ht 63.8 in | Wt 120.0 lb

## 2021-08-14 DIAGNOSIS — J4520 Mild intermittent asthma, uncomplicated: Secondary | ICD-10-CM | POA: Diagnosis not present

## 2021-08-14 DIAGNOSIS — E785 Hyperlipidemia, unspecified: Secondary | ICD-10-CM

## 2021-08-14 DIAGNOSIS — R739 Hyperglycemia, unspecified: Secondary | ICD-10-CM | POA: Insufficient documentation

## 2021-08-14 DIAGNOSIS — D563 Thalassemia minor: Secondary | ICD-10-CM

## 2021-08-14 LAB — BASIC METABOLIC PANEL
BUN: 16 mg/dL (ref 6–23)
CO2: 32 mEq/L (ref 19–32)
Calcium: 9.8 mg/dL (ref 8.4–10.5)
Chloride: 103 mEq/L (ref 96–112)
Creatinine, Ser: 0.9 mg/dL (ref 0.40–1.20)
GFR: 63.64 mL/min (ref >60.00)
Glucose, Bld: 84 mg/dL (ref 70–99)
Potassium: 4.2 mEq/L (ref 3.5–5.1)
Sodium: 138 mEq/L (ref 135–145)

## 2021-08-14 LAB — LIPID PANEL
Cholesterol: 152 mg/dL (ref 0–200)
HDL: 72.6 mg/dL (ref >39.00)
LDL Cholesterol: 67 mg/dL (ref 0–99)
NonHDL: 79.62
Total CHOL/HDL Ratio: 2
Triglycerides: 61 mg/dL (ref 0.0–149.0)
VLDL: 12.2 mg/dL (ref 0.0–40.0)

## 2021-08-14 LAB — HEMOGLOBIN A1C: Hgb A1c MFr Bld: 5.8 % (ref 4.6–6.5)

## 2021-08-14 MED ORDER — ALBUTEROL SULFATE HFA 108 (90 BASE) MCG/ACT IN AERS: 1.0000 | INHALATION_SPRAY | Freq: Four times a day (QID) | RESPIRATORY_TRACT | 5 refills | Status: DC | PRN

## 2021-08-14 NOTE — Progress Notes (Signed)
Subjective:  Patient ID: Jessica Thompson, female    DOB: [birth year]  Age: 71 y.o. MRN: [id number]  CC: Asthma  This visit occurred during the SARS-CoV-2 public health emergency.  Safety protocols were in place, including screening questions prior to the visit, additional usage of staff PPE, and extensive cleaning of exam room while observing appropriate contact time as indicated for disinfecting solutions.    HPI Jessica Thompson presents for f/up -  She was recently admitted for an exacerbation of asthma.  She was treated with systemic steroids and tells me her symptoms have resolved resolved.  She was hyperglycemic.  She denies polys.  Outpatient Medications Prior to Visit  Medication Sig Dispense Refill   budesonide (PULMICORT) 180 MCG/ACT inhaler Inhale 1 puff into the lungs every 6 (six) hours as needed (wheezing). 3 each 1   CALCIUM CARB-CHOLECALCIFEROL PO Take 2 tablets by mouth daily. 650mg  / 1000 units (per tablet)     guaiFENesin (MUCINEX) 600 MG 12 hr tablet Take 600 mg by mouth daily.     guaiFENesin-dextromethorphan (ROBITUSSIN DM) 100-10 MG/5ML syrup Take 5 mLs by mouth every 4 (four) hours as needed for cough. 118 mL 0   ipratropium-albuterol (DUONEB) 0.5-2.5 (3) MG/3ML SOLN Take 3 mLs by nebulization every 6 (six) hours as needed. 360 mL 1   loratadine (CLARITIN) 10 MG tablet Take 10 mg by mouth daily.     Multiple Vitamins-Minerals (CENTRUM SILVER ADULT 50+ PO) Take 1 tablet by mouth daily.     sodium chloride (OCEAN) 0.65 % SOLN nasal spray Place 2 sprays into both nostrils daily.     triamcinolone (NASACORT) 55 MCG/ACT AERO nasal inhaler Place 2 sprays into the nose daily. Reported on 06/26/2015     albuterol (VENTOLIN HFA) 108 (90 Base) MCG/ACT inhaler Inhale 1-2 puffs into the lungs every 6 (six) hours as needed for wheezing or shortness of breath. 18 g 3   montelukast (SINGULAIR) 10 MG tablet Take 1 tablet (10 mg total) by mouth at bedtime. 90 tablet 1    rosuvastatin (CRESTOR) 5 MG tablet Take 1 tablet (5 mg total) by mouth daily. 90 tablet 1   No facility-administered medications prior to visit.    ROS Review of Systems  Constitutional:  Negative for chills, diaphoresis, fatigue and fever.  HENT: Negative.    Eyes: Negative.   Respiratory:  Negative for cough, chest tightness, shortness of breath and wheezing.   Cardiovascular:  Negative for chest pain, palpitations and leg swelling.  Gastrointestinal:  Negative for abdominal pain, constipation, diarrhea and nausea.  Endocrine: Negative.   Genitourinary: Negative.  Negative for difficulty urinating.  Musculoskeletal: Negative.  Negative for arthralgias.  Skin: Negative.   Neurological: Negative.  Negative for dizziness, weakness and light-headedness.  Hematological:  Negative for adenopathy. Does not bruise/bleed easily.  Psychiatric/Behavioral: Negative.     Objective:  BP 138/82 (BP Location: Left Arm, Patient Position: Sitting, Cuff Size: Large)    Pulse 78    Temp 98.8 F (37.1 C) (Oral)    Ht 5' 3.8" (1.621 m)    Wt 120 lb (54.4 kg)    SpO2 97%    BMI 20.73 kg/m   BP Readings from Last 3 Encounters:  08/14/21 138/82  07/26/21 138/77  05/30/21 (!) 146/90    Wt Readings from Last 3 Encounters:  08/14/21 120 lb (54.4 kg)  07/26/21 115 lb (52.2 kg)  05/30/21 117 lb (53.1 kg)    Physical Exam Vitals reviewed.  HENT:  Nose: Nose normal.     Mouth/Throat:     Mouth: Mucous membranes are moist.  Eyes:     General: No scleral icterus.    Conjunctiva/sclera: Conjunctivae normal.  Cardiovascular:     Rate and Rhythm: Normal rate and regular rhythm.     Heart sounds: No murmur heard. Pulmonary:     Effort: Pulmonary effort is normal.     Breath sounds: No stridor. No wheezing, rhonchi or rales.  Abdominal:     General: Abdomen is flat.     Palpations: There is no mass.     Tenderness: There is no abdominal tenderness. There is no guarding.     Hernia: No hernia  is present.  Musculoskeletal:     Cervical back: Neck supple.  Lymphadenopathy:     Cervical: No cervical adenopathy.  Skin:    General: Skin is warm and dry.  Neurological:     General: No focal deficit present.     Mental Status: She is alert.  Psychiatric:        Mood and Affect: Mood normal.        Behavior: Behavior normal.    Lab Results  Component Value Date   WBC 8.7 07/24/2021   HGB 11.2 (L) 07/24/2021   HCT 33.0 (L) 07/24/2021   PLT 288 07/24/2021   GLUCOSE 84 08/14/2021   CHOL 152 08/14/2021   TRIG 61.0 08/14/2021   HDL 72.60 08/14/2021   LDLCALC 67 08/14/2021   ALT 13 04/11/2020   AST 20 04/11/2020   NA 138 08/14/2021   K 4.2 08/14/2021   CL 103 08/14/2021   CREATININE 0.90 08/14/2021   BUN 16 08/14/2021   CO2 32 08/14/2021   TSH 1.75 10/10/2020   HGBA1C 5.8 08/14/2021    DG Chest Port 1 View  Result Date: 07/24/2021 CLINICAL DATA:  Shortness of breath, asthma, wheezing. EXAM: PORTABLE CHEST 1 VIEW COMPARISON:  05/30/2021. FINDINGS: The heart size and mediastinal contours are within normal limits. Aortic atherosclerosis is noted. No consolidation, effusion, or pneumothorax. No acute osseous abnormality. IMPRESSION: No active disease. Electronically Signed   By: Brett Fairy M.D.   On: 07/24/2021 20:36    Assessment & Plan:   Jessica Thompson was seen today for asthma.  Diagnoses and all orders for this visit:  Hyperglycemia- Her A1c is normal. -     Basic metabolic panel; Future -     Fructosamine; Future -     Hemoglobin A1c; Future -     Fructosamine -     Basic metabolic panel -     Hemoglobin A1c  Thalassemia trait  Mild intermittent asthma without complication- Improvement noted. -     albuterol (VENTOLIN HFA) 108 (90 Base) MCG/ACT inhaler; Inhale 1-2 puffs into the lungs every 6 (six) hours as needed for wheezing or shortness of breath.  Hyperlipidemia LDL goal <100- LDL goal achieved. Doing well on the statin  -     Lipid panel; Future -      Lipid panel   I am having Jessica Thompson maintain her loratadine, triamcinolone, Multiple Vitamins-Minerals (CENTRUM SILVER ADULT 50+ PO), guaiFENesin, sodium chloride, CALCIUM CARB-CHOLECALCIFEROL PO, budesonide, ipratropium-albuterol, guaiFENesin-dextromethorphan, and albuterol.  Meds ordered this encounter  Medications   albuterol (VENTOLIN HFA) 108 (90 Base) MCG/ACT inhaler    Sig: Inhale 1-2 puffs into the lungs every 6 (six) hours as needed for wheezing or shortness of breath.    Dispense:  18 g    Refill:  5  Follow-up: Return in about 6 months (around 02/11/2022).  Scarlette Calico, MD

## 2021-08-14 NOTE — Patient Instructions (Signed)
Asthma, Adult Asthma is a long-term (chronic) condition that causes recurrent episodes in which the airways become tight and narrow. The airways are the passages that lead from the nose and mouth down into the lungs. Asthma episodes, also called asthma attacks, can cause coughing, wheezing, shortness of breath, and chest pain. The airways can also fill with mucus. During an attack, it can be difficult to breathe. Asthma attacks can range from minor to life threatening. Asthma cannot be cured, but medicines and lifestyle changes can help control it and treat acute attacks. What are the causes? This condition is believed to be caused by inherited (genetic) and environmental factors, but its exact cause is not known. There are many things that can bring on an asthma attack or make asthma symptoms worse (triggers). Asthma triggers are different for each person. Common triggers include: Mold. Dust. Cigarette smoke. Cockroaches. Things that can cause allergy symptoms (allergens), such as animal dander or pollen from trees or grass. Air pollutants such as household cleaners, wood smoke, smog, or Advertising account planner. Cold air, weather changes, and winds (which increase molds and pollen in the air). Strong emotional expressions such as crying or laughing hard. Stress. Certain medicines (such as aspirin) or types of medicines (such as beta-blockers). Sulfites in foods and drinks. Foods and drinks that may contain sulfites include dried fruit, potato chips, and sparkling grape juice. Infections or inflammatory conditions such as the flu, a cold, or inflammation of the nasal membranes (rhinitis). Gastroesophageal reflux disease (GERD). Exercise or strenuous activity. What are the signs or symptoms? Symptoms of this condition may occur right after asthma is triggered or many hours later. Symptoms include: Wheezing. This can sound like whistling when you breathe. Excessive nighttime or early morning  coughing. Frequent or severe coughing with a common cold. Chest tightness. Shortness of breath. Tiredness (fatigue) with minimal activity. How is this diagnosed? This condition is diagnosed based on: Your medical history. A physical exam. Tests, which may include: Lung function studies and pulmonary studies (spirometry). These tests can evaluate the flow of air in your lungs. Allergy tests. Imaging tests, such as X-rays. How is this treated? There is no cure for this condition, but treatment can help control your symptoms. Treatment for asthma usually involves: Identifying and avoiding your asthma triggers. Using medicines to control your symptoms. Generally, two types of medicines are used to treat asthma: Controller medicines. These help prevent asthma symptoms from occurring. They are usually taken every day. Fast-acting reliever or rescue medicines. These quickly relieve asthma symptoms by widening the narrow and tight airways. They are used as needed and provide short-term relief. Using supplemental oxygen. This may be needed during a severe episode. Using other medicines, such as: Allergy medicines, such as antihistamines, if your asthma attacks are triggered by allergens. Immune medicines (immunomodulators). These are medicines that help control the immune system. Creating an asthma action plan. An asthma action plan is a written plan for managing and treating your asthma attacks. This plan includes: A list of your asthma triggers and how to avoid them. Information about when medicines should be taken and when their dosage should be changed. Instructions about using a device called a peak flow meter. A peak flow meter measures how well the lungs are working and the severity of your asthma. It helps you monitor your condition. Follow these instructions at home: Controlling your home environment Control your home environment in the following ways to help avoid triggers and prevent  asthma attacks: Change your heating  and air conditioning filter regularly. °Limit your use of fireplaces and wood stoves. °Get rid of pests (such as roaches and mice) and their droppings. °Throw away plants if you see mold on them. °Clean floors and dust surfaces regularly. Use unscented cleaning products. °Try to have someone else vacuum for you regularly. Stay out of rooms while they are being vacuumed and for a short while afterward. If you vacuum, use a dust mask from a hardware store, a double-layered or microfilter vacuum cleaner bag, or a vacuum cleaner with a HEPA filter. °Replace carpet with wood, tile, or vinyl flooring. Carpet can trap dander and dust. °Use allergy-proof pillows, mattress covers, and box spring covers. °Keep your bedroom a trigger-free room. °Avoid pets and keep windows closed when allergens are in the air. °Wash beddings every week in hot water and dry them in a dryer. °Use blankets that are made of polyester or cotton. °Clean bathrooms and kitchens with bleach. If possible, have someone repaint the walls in these rooms with mold-resistant paint. Stay out of the rooms that are being cleaned and painted. °Wash your hands often with soap and water. If soap and water are not available, use hand sanitizer. °Do not allow anyone to smoke in your home. °General instructions °Take over-the-counter and prescription medicines only as told by your health care provider. °Speak with your health care provider if you have questions about how or when to take the medicines. °Make note if you are requiring more frequent dosages. °Do not use any products that contain nicotine or tobacco, such as cigarettes and e-cigarettes. If you need help quitting, ask your health care provider. Also, avoid being exposed to secondhand smoke. °Use a peak flow meter as told by your health care provider. Record and keep track of the readings. °Understand and use the asthma action plan to help minimize, or stop an asthma  attack, without needing to seek medical care. °Make sure you stay up to date on your yearly vaccinations as told by your health care provider. This may include vaccines for the flu and pneumonia. °Avoid outdoor activities when allergen counts are high and when air quality is low. °Wear a ski mask that covers your nose and mouth during outdoor winter activities. Exercise indoors on cold days if you can. °Warm up before exercising, and take time for a cool-down period after exercise. °Keep all follow-up visits as told by your health care provider. This is important. °Where to find more information °For information about asthma, turn to the Centers for Disease Control and Prevention at www.cdc.gov/asthma/faqs °For air quality information, turn to [employer]Now at airnow.gov °Contact a health care provider if: °You have wheezing, shortness of breath, or a cough even while you are taking medicine to prevent attacks. °The mucus you cough up (sputum) is thicker than usual. °Your sputum changes from clear or white to yellow, green, gray, or bloody. °Your medicines are causing side effects, such as a rash, itching, swelling, or trouble breathing. °You need to use a reliever medicine more than 2-3 times a week. °Your peak flow reading is still at 50-79% of your personal best after following your action plan for 1 hour. °You have a fever. °Get help right away if: °You are getting worse and do not respond to treatment during an asthma attack. °You are short of breath when at rest or when doing very little physical activity. °You have difficulty eating, drinking, or talking. °You have chest pain or tightness. °You develop a fast heartbeat or   palpitations. You have a bluish color to your lips or fingernails. You are light-headed or dizzy, or you faint. Your peak flow reading is less than 50% of your personal best. You feel too tired to breathe normally. Summary Asthma is a long-term (chronic) condition that causes recurrent  episodes in which the airways become tight and narrow. These episodes can cause coughing, wheezing, shortness of breath, and chest pain. Asthma cannot be cured, but medicines and lifestyle changes can help control it and treat acute attacks. Make sure you understand how to avoid triggers and how and when to use your medicines. Asthma attacks can range from minor to life threatening. Get help right away if you have an asthma attack and do not respond to treatment with your usual rescue medicines. This information is not intended to replace advice given to you by your health care provider. Make sure you discuss any questions you have with your health care provider. Document Revised: 03/09/2020 Document Reviewed: 10/12/2019 Elsevier Patient Education  2022 [employer].

## 2021-08-15 ENCOUNTER — Encounter: Payer: Self-pay | Admitting: Internal Medicine

## 2021-08-15 DIAGNOSIS — J4520 Mild intermittent asthma, uncomplicated: Secondary | ICD-10-CM

## 2021-08-15 DIAGNOSIS — E785 Hyperlipidemia, unspecified: Secondary | ICD-10-CM

## 2021-08-15 MED ORDER — ROSUVASTATIN CALCIUM 5 MG PO TABS: 5.0000 mg | ORAL_TABLET | Freq: Every day | ORAL | 1 refills | Status: DC

## 2021-08-15 MED ORDER — MONTELUKAST SODIUM 10 MG PO TABS: 10.0000 mg | ORAL_TABLET | Freq: Every day | ORAL | 1 refills | Status: DC

## 2021-08-19 LAB — FRUCTOSAMINE: Fructosamine: 257 umol/L (ref 205–285)

## 2021-08-23 ENCOUNTER — Telehealth: Payer: Self-pay | Admitting: Internal Medicine

## 2021-08-23 DIAGNOSIS — R030 Elevated blood-pressure reading, without diagnosis of hypertension: Secondary | ICD-10-CM | POA: Diagnosis not present

## 2021-08-23 DIAGNOSIS — Z7951 Long term (current) use of inhaled steroids: Secondary | ICD-10-CM | POA: Diagnosis not present

## 2021-08-23 DIAGNOSIS — E785 Hyperlipidemia, unspecified: Secondary | ICD-10-CM | POA: Diagnosis not present

## 2021-08-23 DIAGNOSIS — J45909 Unspecified asthma, uncomplicated: Secondary | ICD-10-CM | POA: Diagnosis not present

## 2021-08-23 DIAGNOSIS — Z8249 Family history of ischemic heart disease and other diseases of the circulatory system: Secondary | ICD-10-CM | POA: Diagnosis not present

## 2021-08-23 DIAGNOSIS — Z91018 Allergy to other foods: Secondary | ICD-10-CM | POA: Diagnosis not present

## 2021-08-23 DIAGNOSIS — K5900 Constipation, unspecified: Secondary | ICD-10-CM | POA: Diagnosis not present

## 2021-08-23 NOTE — Telephone Encounter (Signed)
Patient calling in ? ?Patient says she spoke w/ [facility] to setup colonoscopy but they said they are missing the gastroenterology notes for patient ? ?Patient is requesting these be faxed to [phone] ?

## 2021-09-13 ENCOUNTER — Encounter: Payer: Self-pay | Admitting: Internal Medicine

## 2021-09-16 ENCOUNTER — Other Ambulatory Visit: Payer: Self-pay | Admitting: Internal Medicine

## 2021-09-17 ENCOUNTER — Other Ambulatory Visit: Payer: Self-pay | Admitting: Internal Medicine

## 2021-12-10 ENCOUNTER — Other Ambulatory Visit: Payer: Self-pay | Admitting: Internal Medicine

## 2021-12-10 DIAGNOSIS — J4520 Mild intermittent asthma, uncomplicated: Secondary | ICD-10-CM

## 2021-12-10 DIAGNOSIS — E785 Hyperlipidemia, unspecified: Secondary | ICD-10-CM

## 2021-12-17 ENCOUNTER — Other Ambulatory Visit: Payer: Self-pay | Admitting: Internal Medicine

## 2021-12-17 ENCOUNTER — Telehealth: Payer: Self-pay | Admitting: Internal Medicine

## 2021-12-17 DIAGNOSIS — Z1211 Encounter for screening for malignant neoplasm of colon: Secondary | ICD-10-CM

## 2021-12-17 NOTE — Telephone Encounter (Signed)
Pt is requesting a referral to [facility] for a colonoscopy. Pt stated the referral was supposed to be requested at her last visit. There was a previous referral in the system and pt said she never heard anything back.   Please advise.

## 2021-12-25 ENCOUNTER — Ambulatory Visit (INDEPENDENT_AMBULATORY_CARE_PROVIDER_SITE_OTHER): Payer: Medicare HMO

## 2021-12-25 VITALS — BP 120/60 | HR 60 | Temp 97.5°F | Ht 63.82 in | Wt 114.0 lb

## 2021-12-25 DIAGNOSIS — Z13820 Encounter for screening for osteoporosis: Secondary | ICD-10-CM | POA: Diagnosis not present

## 2021-12-25 DIAGNOSIS — Z0000 Encounter for general adult medical examination without abnormal findings: Secondary | ICD-10-CM

## 2021-12-25 NOTE — Patient Instructions (Signed)
Jessica Thompson , Thank you for taking time to come for your Medicare Wellness Visit. I appreciate your ongoing commitment to your health goals. Please review the following plan we discussed and let me know if I can assist you in the future.   Screening recommendations/referrals: Colonoscopy: order placed 12/17/2021 Mammogram: 07/16/2021; due every year Bone Density: 07/04/2016; due every 2 years Recommended yearly ophthalmology/optometry visit for glaucoma screening and checkup Recommended yearly dental visit for hygiene and checkup  Vaccinations: Influenza vaccine: 04/13/2021 Pneumococcal vaccine: 05/02/2015, 04/11/2020 Tdap vaccine: 06/08/2020; due every 10 years Shingles vaccine: 04/14/2020, 06/30/2020   Covid-19: 08/24/2019, 09/22/2019, 04/13/2021  Advanced directives: No; Advance directive discussed with you today. I have provided a copy for you to complete at home and have notarized. Once this is complete please bring a copy in to our office so we can scan it into your chart.  Conditions/risks identified: Yes  Next appointment: Please schedule your next Medicare Wellness Visit with your Nurse Health Advisor in 1 year by calling [phone].   Preventive Care 39 Years and Older, Female Preventive care refers to lifestyle choices and visits with your health care provider that can promote health and wellness. What does preventive care include? A yearly physical exam. This is also called an annual well check. Dental exams once or twice a year. Routine eye exams. Ask your health care provider how often you should have your eyes checked. Personal lifestyle choices, including: Daily care of your teeth and gums. Regular physical activity. Eating a healthy diet. Avoiding tobacco and drug use. Limiting alcohol use. Practicing safe sex. Taking low-dose aspirin every day. Taking vitamin and mineral supplements as recommended by your health care provider. What happens during an annual well  check? The services and screenings done by your health care provider during your annual well check will depend on your age, overall health, lifestyle risk factors, and family history of disease. Counseling  Your health care provider may ask you questions about your: Alcohol use. Tobacco use. Drug use. Emotional well-being. Home and relationship well-being. Sexual activity. Eating habits. History of falls. Memory and ability to understand (cognition). Work and work Statistician. Reproductive health. Screening  You may have the following tests or measurements: Height, weight, and BMI. Blood pressure. Lipid and cholesterol levels. These may be checked every 5 years, or more frequently if you are over 8 years old. Skin check. Lung cancer screening. You may have this screening every year starting at age 80 if you have a 30-pack-year history of smoking and currently smoke or have quit within the past 15 years. Fecal occult blood test (FOBT) of the stool. You may have this test every year starting at age 7. Flexible sigmoidoscopy or colonoscopy. You may have a sigmoidoscopy every 5 years or a colonoscopy every 10 years starting at age 7. Hepatitis C blood test. Hepatitis B blood test. Sexually transmitted disease (STD) testing. Diabetes screening. This is done by checking your blood sugar (glucose) after you have not eaten for a while (fasting). You may have this done every 1-3 years. Bone density scan. This is done to screen for osteoporosis. You may have this done starting at age 58. Mammogram. This may be done every 1-2 years. Talk to your health care provider about how often you should have regular mammograms. Talk with your health care provider about your test results, treatment options, and if necessary, the need for more tests. Vaccines  Your health care provider may recommend certain vaccines, such as: Influenza vaccine. This  is recommended every year. Tetanus, diphtheria, and  acellular pertussis (Tdap, Td) vaccine. You may need a Td booster every 10 years. Zoster vaccine. You may need this after age 37. Pneumococcal 13-valent conjugate (PCV13) vaccine. One dose is recommended after age 30. Pneumococcal polysaccharide (PPSV23) vaccine. One dose is recommended after age 50. Talk to your health care provider about which screenings and vaccines you need and how often you need them. This information is not intended to replace advice given to you by your health care provider. Make sure you discuss any questions you have with your health care provider. Document Released: 07/06/2015 Document Revised: 02/27/2016 Document Reviewed: 04/10/2015 Elsevier Interactive Patient Education  2017 [employer] Prevention in the Home Falls can cause injuries. They can happen to people of all ages. There are many things you can do to make your home safe and to help prevent falls. What can I do on the outside of my home? Regularly fix the edges of walkways and driveways and fix any cracks. Remove anything that might make you trip as you walk through a door, such as a raised step or threshold. Trim any bushes or trees on the path to your home. Use bright outdoor lighting. Clear any walking paths of anything that might make someone trip, such as rocks or tools. Regularly check to see if handrails are loose or broken. Make sure that both sides of any steps have handrails. Any raised decks and porches should have guardrails on the edges. Have any leaves, snow, or ice cleared regularly. Use sand or salt on walking paths during winter. Clean up any spills in your garage right away. This includes oil or grease spills. What can I do in the bathroom? Use night lights. Install grab bars by the toilet and in the tub and shower. Do not use towel bars as grab bars. Use non-skid mats or decals in the tub or shower. If you need to sit down in the shower, use a plastic, non-slip stool. Keep  the floor dry. Clean up any water that spills on the floor as soon as it happens. Remove soap buildup in the tub or shower regularly. Attach bath mats securely with double-sided non-slip rug tape. Do not have throw rugs and other things on the floor that can make you trip. What can I do in the bedroom? Use night lights. Make sure that you have a light by your bed that is easy to reach. Do not use any sheets or blankets that are too big for your bed. They should not hang down onto the floor. Have a firm chair that has side arms. You can use this for support while you get dressed. Do not have throw rugs and other things on the floor that can make you trip. What can I do in the kitchen? Clean up any spills right away. Avoid walking on wet floors. Keep items that you use a lot in easy-to-reach places. If you need to reach something above you, use a strong step stool that has a grab bar. Keep electrical cords out of the way. Do not use floor polish or wax that makes floors slippery. If you must use wax, use non-skid floor wax. Do not have throw rugs and other things on the floor that can make you trip. What can I do with my stairs? Do not leave any items on the stairs. Make sure that there are handrails on both sides of the stairs and use them. Fix handrails that  are broken or loose. Make sure that handrails are as long as the stairways. Check any carpeting to make sure that it is firmly attached to the stairs. Fix any carpet that is loose or worn. Avoid having throw rugs at the top or bottom of the stairs. If you do have throw rugs, attach them to the floor with carpet tape. Make sure that you have a light switch at the top of the stairs and the bottom of the stairs. If you do not have them, ask someone to add them for you. What else can I do to help prevent falls? Wear shoes that: Do not have high heels. Have rubber bottoms. Are comfortable and fit you well. Are closed at the toe. Do not  wear sandals. If you use a stepladder: Make sure that it is fully opened. Do not climb a closed stepladder. Make sure that both sides of the stepladder are locked into place. Ask someone to hold it for you, if possible. Clearly mark and make sure that you can see: Any grab bars or handrails. First and last steps. Where the edge of each step is. Use tools that help you move around (mobility aids) if they are needed. These include: Canes. Walkers. Scooters. Crutches. Turn on the lights when you go into a dark area. Replace any light bulbs as soon as they burn out. Set up your furniture so you have a clear path. Avoid moving your furniture around. If any of your floors are uneven, fix them. If there are any pets around you, be aware of where they are. Review your medicines with your doctor. Some medicines can make you feel dizzy. This can increase your chance of falling. Ask your doctor what other things that you can do to help prevent falls. This information is not intended to replace advice given to you by your health care provider. Make sure you discuss any questions you have with your health care provider. Document Released: 04/05/2009 Document Revised: 11/15/2015 Document Reviewed: 07/14/2014 Elsevier Interactive Patient Education  2017 [employer].

## 2021-12-25 NOTE — Progress Notes (Signed)
Subjective:   Jessica Thompson is a 71 y.o. female who presents for Medicare Annual (Subsequent) preventive examination.  Review of Systems     Cardiac Risk Factors include: advanced age (>68mn, >>62women);hypertension;dyslipidemia     Objective:    Today's Vitals   12/25/21 1243  BP: 120/60  Pulse: 60  Temp: (!) 97.5 F (36.4 C)  SpO2: 99%  Weight: 114 lb (51.7 kg)  Height: 5' 3.82" (1.621 m)  PainSc: 0-No pain   Body mass index is 19.68 kg/m.     12/25/2021    1:13 PM 03/23/2019    1:03 PM  Advanced Directives  Does Patient Have a Medical Advance Directive? No Yes  Type of ACorporate treasurerof ATullytownLiving will  Copy of [place]in Chart?  No - copy requested  Would patient like information on creating a medical advance directive? Yes (MAU/Ambulatory/Procedural Areas - Information given)     Current Medications (verified) Outpatient Encounter Medications as of 12/25/2021  Medication Sig   albuterol (VENTOLIN HFA) 108 (90 Base) MCG/ACT inhaler Inhale 1-2 puffs into the lungs every 6 (six) hours as needed for wheezing or shortness of breath.   budesonide (PULMICORT) 180 MCG/ACT inhaler Inhale 1 puff into the lungs every 6 (six) hours as needed (wheezing).   CALCIUM CARB-CHOLECALCIFEROL PO Take 2 tablets by mouth daily. '650mg'$  / 1000 units (per tablet)   guaiFENesin (MUCINEX) 600 MG 12 hr tablet Take 600 mg by mouth daily.   guaiFENesin-dextromethorphan (ROBITUSSIN DM) 100-10 MG/5ML syrup Take 5 mLs by mouth every 4 (four) hours as needed for cough.   ipratropium-albuterol (DUONEB) 0.5-2.5 (3) MG/3ML SOLN Take 3 mLs by nebulization every 6 (six) hours as needed.   loratadine (CLARITIN) 10 MG tablet Take 10 mg by mouth daily.   montelukast (SINGULAIR) 10 MG tablet TAKE 1 TABLET BY MOUTH EVERYDAY AT BEDTIME   Multiple Vitamins-Minerals (CENTRUM SILVER ADULT 50+ PO) Take 1 tablet by mouth daily.   rosuvastatin (CRESTOR) 5 MG tablet  TAKE 1 TABLET (5 MG TOTAL) BY MOUTH DAILY.   sodium chloride (OCEAN) 0.65 % SOLN nasal spray Place 2 sprays into both nostrils daily.   triamcinolone (NASACORT) 55 MCG/ACT AERO nasal inhaler Place 2 sprays into the nose daily. Reported on 06/26/2015   No facility-administered encounter medications on file as of 12/25/2021.    Allergies (verified) Cabbage, Cheese, Corn-containing products, Erythromycin, Peanut-containing drug products, Soy allergy, Wheat bran, and Mushroom extract complex   History: Past Medical History:  Diagnosis Date   Allergy    Anemia    Betathalasemia minor   Asthma    Sinusitis    Past Surgical History:  Procedure Laterality Date   POLYPECTOMY     sinus sugery     Family History  Problem Relation Age of Onset   Hyperlipidemia Mother    Anemia Mother    Hypertension Father    Emphysema Father    Heart disease Brother    Liver disease Brother    Social History   Socioeconomic History   Marital status: Married    Spouse name: Not on file   Number of children: Not on file   Years of education: Not on file   Highest education level: Not on file  Occupational History   Occupation: retired  Tobacco Use   Smoking status: Never   Smokeless tobacco: Never  Vaping Use   Vaping Use: Never used  Substance and Sexual Activity   Alcohol use:  No   Drug use: No   Sexual activity: Not Currently  Other Topics Concern   Not on file  Social History Narrative   Not on file   Social Determinants of Health   Financial Resource Strain: Low Risk  (12/25/2021)   Overall Financial Resource Strain (CARDIA)    Difficulty of Paying Living Expenses: Not hard at all  Food Insecurity: No Food Insecurity (12/25/2021)   Hunger Vital Sign    Worried About Running Out of Food in the Last Year: Never true    Ran Out of Food in the Last Year: Never true  Transportation Needs: No Transportation Needs (12/25/2021)   PRAPARE - Hydrologist (Medical):  No    Lack of Transportation (Non-Medical): No  Physical Activity: Inactive (12/25/2021)   Exercise Vital Sign    Days of Exercise per Week: 0 days    Minutes of Exercise per Session: 0 min  Stress: No Stress Concern Present (12/25/2021)   [place]    Feeling of Stress : Not at all  Social Connections: [place] (12/25/2021)   Social Connection and Isolation Panel [NHANES]    Frequency of Communication with Friends and Family: More than three times a week    Frequency of Social Gatherings with Friends and Family: More than three times a week    Attends Religious Services: More than 4 times per year    Active Member of [employer] or Organizations: Yes    Attends Music therapist: More than 4 times per year    Marital Status: Married    Tobacco Counseling Counseling given: Not Answered   Clinical Intake:  Pre-visit preparation completed: Yes  Pain : No/denies pain Pain Score: 0-No pain     BMI - recorded: 19.68 Nutritional Status: BMI of 19-24  Normal Nutritional Risks: None Diabetes: No  How often do you need to have someone help you when you read instructions, pamphlets, or other written materials from your doctor or pharmacy?: 1 - Never What is the last grade level you completed in school?: Master's Degree  Diabetic? no  Interpreter Needed?: No  Information entered by :: Lisette Abu, LPN.   Activities of Daily Living    12/25/2021    2:00 PM  In your present state of health, do you have any difficulty performing the following activities:  Hearing? 0  Vision? 0  Difficulty concentrating or making decisions? 0  Walking or climbing stairs? 0  Dressing or bathing? 0  Doing errands, shopping? 0  Preparing Food and eating ? N  Using the Toilet? N  In the past six months, have you accidently leaked urine? N  Do you have problems with loss of bowel control? N  Managing your  Medications? N  Managing your Finances? N  Housekeeping or managing your Housekeeping? N    Patient Care Team: Janith Lima, MD as PCP - General (Internal Medicine) Szabat, Darnelle Maffucci, [facility] (Inactive) as Pharmacist (Pharmacist) Katy Apo, MD as Consulting Physician (Ophthalmology)  Indicate any recent Medical Services you may have received from other than [facility] providers in the past year (date may be approximate).     Assessment:   This is a routine wellness examination for [place].  Hearing/Vision screen Hearing Screening - Comments:: Patient denied any hearing difficulty.   No hearing aids.  Vision Screening - Comments:: Patient does wear corrective lenses/contacts.  Eye exam done by: Dr. Katy Apo  Dietary issues and exercise activities discussed: Current Exercise Habits: Home exercise routine, Type of exercise: walking, Time (Minutes): 30, Frequency (Times/Week): 5, Weekly Exercise (Minutes/Week): 150, Intensity: Mild, Exercise limited by: respiratory conditions(s)   Goals Addressed             This Visit's Progress    Continue to watch my cholesterol and stay active.       Patient Stated        Depression Screen    12/25/2021   12:45 PM 05/30/2021    2:55 PM 04/11/2020    1:51 PM 03/23/2019   11:22 AM 09/02/2018   11:09 AM 02/25/2018   10:10 AM 05/02/2015   10:55 AM  PHQ 2/9 Scores  PHQ - 2 Score 0 0 0 0 0 0 0    Fall Risk    12/25/2021    1:13 PM 05/30/2021    2:55 PM 04/11/2020    1:51 PM 03/23/2019   11:20 AM 09/02/2018   11:09 AM  [place] in the past year? 0 0 0 1 0  Number falls in past yr: 0   0   Injury with Fall? 0   0   Risk for fall due to : No Fall Risks      Follow up Falls evaluation completed        FALL RISK PREVENTION PERTAINING TO THE HOME:  Any stairs in or around the home? No  If so, are there any without handrails? No  Home free of loose throw rugs in walkways, pet beds, electrical cords, etc? Yes  Adequate lighting  in your home to reduce risk of falls? Yes   ASSISTIVE DEVICES UTILIZED TO PREVENT FALLS:  Life alert? No  Use of a cane, walker or w/c? No  Grab bars in the bathroom? Yes  Shower chair or bench in shower? Yes  Elevated toilet seat or a handicapped toilet? Yes   TIMED UP AND GO:  Was the test performed? Yes .  Length of time to ambulate 10 feet: 6 sec.   Gait steady and fast without use of assistive device  Cognitive Function:        12/25/2021    2:01 PM  6CIT Screen  What Year? 0 points  What month? 0 points  What time? 0 points  Count back from 20 0 points  Months in reverse 0 points  Repeat phrase 0 points  Total Score 0 points    Immunizations Immunization History  Administered Date(s) Administered   Influenza-Unspecified 02/21/2014, 04/10/2020, 04/13/2021   PFIZER(Purple Top)SARS-COV-2 Vaccination 08/24/2019, 09/22/2019   Pfizer Covid-19 Vaccine Bivalent Booster 5y-11y 04/13/2021   Pneumococcal Conjugate-13 05/02/2015   Pneumococcal Polysaccharide-23 10/05/2013, 04/11/2020   Tdap 07/05/2013, 06/08/2020   Zoster Recombinat (Shingrix) 04/14/2020, 06/30/2020    TDAP status: Up to date  Flu Vaccine status: Up to date  Pneumococcal vaccine status: Up to date  Covid-19 vaccine status: Completed vaccines  Qualifies for Shingles Vaccine? Yes   Zostavax completed No   Shingrix Completed?: Yes  Screening Tests Health Maintenance  Topic Date Due   COLONOSCOPY (Pts 45-35yr Insurance coverage will need to be confirmed)  03/28/2021   COVID-19 Vaccine (4 - Pfizer series) 08/14/2021   INFLUENZA VACCINE  01/21/2022   MAMMOGRAM  07/17/2023   TETANUS/TDAP  06/08/2030   Pneumonia Vaccine 71 Years old  Completed   DEXA SCAN  Completed   Hepatitis C Screening  Completed   Zoster Vaccines- Shingrix  Completed  HPV VACCINES  Aged Out    Health Maintenance  Health Maintenance Due  Topic Date Due   COLONOSCOPY (Pts 45-50yr Insurance coverage will need to be  confirmed)  03/28/2021   COVID-19 Vaccine (4 - Pfizer series) 08/14/2021    Colorectal cancer screening: [facility] Pt aware the office will call re: appt.  Mammogram status: Completed 07/16/2021. Repeat every year  Bone Density status: Ordered 12/25/2021. Pt provided with contact info and advised to call to schedule appt.  Lung Cancer Screening: (Low Dose CT Chest recommended if Age 73-80years, 30 pack-year currently smoking OR have quit w/in 15years.) does not qualify.   Lung Cancer Screening Referral: no  Additional Screening:  Hepatitis C Screening: does not qualify; Completed no  Vision Screening: Recommended annual ophthalmology exams for early detection of glaucoma and other disorders of the eye. Is the patient up to date with their annual eye exam?  Yes  Who is the provider or what is the name of the office in which the patient attends annual eye exams? GKaty Apo MD. If pt is not established with a provider, would they like to be referred to a provider to establish care? No .   Dental Screening: Recommended annual dental exams for proper oral hygiene  Community Resource Referral / Chronic Care Management: CRR required this visit?  No   CCM required this visit?  No      Plan:     I have personally reviewed and noted the following in the patient's chart:   Medical and social history Use of alcohol, tobacco or illicit drugs  Current medications and supplements including opioid prescriptions.  Functional ability and status Nutritional status Physical activity Advanced directives List of other physicians Hospitalizations, surgeries, and ER visits in previous 12 months Vitals Screenings to include cognitive, depression, and falls Referrals and appointments  In addition, I have reviewed and discussed with patient certain preventive protocols, quality metrics, and best practice recommendations. A written personalized  care plan for preventive services as well as general preventive health recommendations were provided to patient.     SSheral Flow LPN   [id number]  Nurse Notes:  Hearing Screening - Comments:: Patient denied any hearing difficulty.   No hearing aids.  Vision Screening - Comments:: Patient does wear corrective lenses/contacts.  Eye exam done by: Dr. GKaty Apo

## 2022-01-08 ENCOUNTER — Encounter: Payer: Self-pay | Admitting: Internal Medicine

## 2022-01-08 ENCOUNTER — Other Ambulatory Visit: Payer: Self-pay | Admitting: Internal Medicine

## 2022-01-08 DIAGNOSIS — J4520 Mild intermittent asthma, uncomplicated: Secondary | ICD-10-CM

## 2022-01-08 MED ORDER — ALBUTEROL SULFATE HFA 108 (90 BASE) MCG/ACT IN AERS: 1.0000 | INHALATION_SPRAY | Freq: Four times a day (QID) | RESPIRATORY_TRACT | 5 refills | Status: DC | PRN

## 2022-01-21 ENCOUNTER — Ambulatory Visit (INDEPENDENT_AMBULATORY_CARE_PROVIDER_SITE_OTHER): Payer: Medicare HMO | Admitting: Internal Medicine

## 2022-01-21 ENCOUNTER — Encounter: Payer: Self-pay | Admitting: Internal Medicine

## 2022-01-21 VITALS — BP 132/80 | HR 82 | Temp 98.2°F | Ht 63.82 in | Wt 114.8 lb

## 2022-01-21 DIAGNOSIS — U071 COVID-19: Secondary | ICD-10-CM | POA: Diagnosis not present

## 2022-01-21 DIAGNOSIS — J4520 Mild intermittent asthma, uncomplicated: Secondary | ICD-10-CM

## 2022-01-21 NOTE — Assessment & Plan Note (Deleted)
Acute Symptoms started approximately 3 weeks ago and are mild in nature She tested herself yesterday for the first time and is positive She is out of the window for antiviral and her symptoms are mild enough that I do not think she needs 1 Her biggest complaint is nasal congestion She will continue for allergy and asthma medications daily Continue Mucinex Discussed that most likely her sinus infection symptoms are viral in nature and should get better with continued conservative treatment She has taken Sudafed in the past and her blood pressure is well controlled without medication so discussed that she can try taking Sudafed or Coricidin cold products to see if that helps with some of the congestion Typically she has congestion first thing in the morning and is fine the rest of the day-reassured her that this should continue to improve over the next week or so She will call if there is any concerns, questions or her symptoms or not improving

## 2022-01-21 NOTE — Progress Notes (Addendum)
Subjective:    Patient ID: Jessica Thompson, female    DOB: 1951-04-19, 71 y.o.   MRN: [id number]      HPI Jessica Thompson is here for  Chief Complaint  Patient presents with   Covid Positive    Symptoms x 3 weeks; last 2 weeks yellow drainage; tested yesterday and it was positive    Her symptoms started about 3 weeks ago - PND, runny nose, nasal drainage that was yellow.  She has nasal congestion in the morning only and then it clears up.  Was using nasocort and allergy medication.  She uses both of her inhalers daily.  She is also taking Mucinex.  She was not sure why her congestion was not clearing up and a friend of hers mentioned that she had similar symptoms and had COVID.  When she tested herself yesterday she was positive.  Her biggest concern is what she can do to help with the congestion.      Medications and allergies reviewed with patient and updated if appropriate.  Current Outpatient Medications on File Prior to Visit  Medication Sig Dispense Refill   albuterol (VENTOLIN HFA) 108 (90 Base) MCG/ACT inhaler Inhale 1-2 puffs into the lungs every 6 (six) hours as needed for wheezing or shortness of breath. 18 g 5   budesonide (PULMICORT) 180 MCG/ACT inhaler Inhale 1 puff into the lungs every 6 (six) hours as needed (wheezing). 3 each 1   CALCIUM CARB-CHOLECALCIFEROL PO Take 2 tablets by mouth daily. '650mg'$  / 1000 units (per tablet)     guaiFENesin (MUCINEX) 600 MG 12 hr tablet Take 600 mg by mouth daily.     guaiFENesin-dextromethorphan (ROBITUSSIN DM) 100-10 MG/5ML syrup Take 5 mLs by mouth every 4 (four) hours as needed for cough. 118 mL 0   ipratropium-albuterol (DUONEB) 0.5-2.5 (3) MG/3ML SOLN Take 3 mLs by nebulization every 6 (six) hours as needed. 360 mL 1   loratadine (CLARITIN) 10 MG tablet Take 10 mg by mouth daily.     montelukast (SINGULAIR) 10 MG tablet TAKE 1 TABLET BY MOUTH EVERYDAY AT BEDTIME 90 tablet 1   Multiple Vitamins-Minerals (CENTRUM SILVER ADULT 50+  PO) Take 1 tablet by mouth daily.     rosuvastatin (CRESTOR) 5 MG tablet TAKE 1 TABLET (5 MG TOTAL) BY MOUTH DAILY. 90 tablet 1   sodium chloride (OCEAN) 0.65 % SOLN nasal spray Place 2 sprays into both nostrils daily.     triamcinolone (NASACORT) 55 MCG/ACT AERO nasal inhaler Place 2 sprays into the nose daily. Reported on 06/26/2015     No current facility-administered medications on file prior to visit.    Review of Systems  Constitutional:  Positive for fatigue (mild). Negative for appetite change, chills and fever.  HENT:  Positive for congestion, postnasal drip, rhinorrhea, sneezing and sore throat (1-2 times - did salt gargles). Negative for ear pain and sinus pain.        No change in taste / smell, feels like there is water in ears  Eyes:  Negative for discharge and itching.  Respiratory:  Positive for cough (rare). Negative for chest tightness, shortness of breath and wheezing (seldom).   Cardiovascular:  Negative for chest pain.  Gastrointestinal:  Negative for abdominal pain, diarrhea and nausea.  Musculoskeletal:  Negative for myalgias.  Neurological:  Negative for dizziness, light-headedness and headaches.       Objective:   Vitals:   01/21/22 1432  BP: 132/80  Pulse: 82  Temp: 98.2 F (36.8  C)  SpO2: 96%   BP Readings from Last 3 Encounters:  01/21/22 132/80  12/25/21 120/60  08/14/21 138/82   Wt Readings from Last 3 Encounters:  01/21/22 114 lb 12.8 oz (52.1 kg)  12/25/21 114 lb (51.7 kg)  08/14/21 120 lb (54.4 kg)   Body mass index is 19.82 kg/m.    Physical Exam Constitutional:      General: She is not in acute distress.    Appearance: Normal appearance.  HENT:     Head: Normocephalic and atraumatic.  Eyes:     Conjunctiva/sclera: Conjunctivae normal.  Cardiovascular:     Rate and Rhythm: Normal rate and regular rhythm.     Heart sounds: Normal heart sounds. No murmur heard. Pulmonary:     Effort: Pulmonary effort is normal. No respiratory  distress.     Breath sounds: Normal breath sounds. No wheezing.  Musculoskeletal:     Cervical back: Neck supple.     Right lower leg: No edema.     Left lower leg: No edema.  Lymphadenopathy:     Cervical: No cervical adenopathy.  Skin:    General: Skin is warm and dry.     Findings: No rash.  Neurological:     Mental Status: She is alert. Mental status is at baseline.  Psychiatric:        Mood and Affect: Mood normal.        Behavior: Behavior normal.            Assessment & Plan:    COVID: Acute Symptoms started approximately 3 weeks ago and are mild in nature She tested herself yesterday for the first time and is positive She is out of the window for antiviral and her symptoms are mild enough that I do not think she needs 1 Her biggest complaint is nasal congestion She will continue for allergy and asthma medications daily Continue Mucinex Discussed that most likely her sinus infection symptoms are viral in nature and should get better with continued conservative treatment She has taken Sudafed in the past and her blood pressure is well controlled without medication so discussed that she can try taking Sudafed or Coricidin cold products to see if that helps with some of the congestion Typically she has congestion first thing in the morning and is fine the rest of the day-reassured her that this should continue to improve over the next week or so She will call if there is any concerns, questions or her symptoms or not improving  Asthma, mild-intermittent: Chronic Has well-controlled asthma No evidence of asthma exacerbation at this time Continue Pulmicort daily Continue albuterol daily-can take albuterol up to every 6 hours if needed for wheezing, cough or shortness of breath

## 2022-01-21 NOTE — Patient Instructions (Addendum)
    Medications changes include :   try taking sudafed for your nasal congestion.  Keep doing everything you are doing.       Return if symptoms worsen or fail to improve.

## 2022-01-21 NOTE — Addendum Note (Signed)
Addended by: Binnie Rail on: 01/21/2022 04:44 PM   Modules accepted: Level of Service

## 2022-02-12 ENCOUNTER — Encounter: Payer: Self-pay | Admitting: Internal Medicine

## 2022-02-12 ENCOUNTER — Ambulatory Visit (INDEPENDENT_AMBULATORY_CARE_PROVIDER_SITE_OTHER): Payer: Medicare HMO | Admitting: Internal Medicine

## 2022-02-12 VITALS — BP 146/86 | HR 76 | Temp 98.1°F | Ht 63.82 in | Wt 113.0 lb

## 2022-02-12 DIAGNOSIS — D563 Thalassemia minor: Secondary | ICD-10-CM | POA: Diagnosis not present

## 2022-02-12 DIAGNOSIS — J309 Allergic rhinitis, unspecified: Secondary | ICD-10-CM | POA: Diagnosis not present

## 2022-02-12 DIAGNOSIS — E7800 Pure hypercholesterolemia, unspecified: Secondary | ICD-10-CM

## 2022-02-12 DIAGNOSIS — J4520 Mild intermittent asthma, uncomplicated: Secondary | ICD-10-CM | POA: Diagnosis not present

## 2022-02-12 DIAGNOSIS — E785 Hyperlipidemia, unspecified: Secondary | ICD-10-CM

## 2022-02-12 LAB — CBC WITH DIFFERENTIAL/PLATELET
Basophils Relative: 0.3 % (ref 0.0–3.0)
Eosinophils Relative: 1 % (ref 0.0–5.0)
HCT: 31 % — ABNORMAL LOW (ref 36.0–46.0)
Hemoglobin: 9.4 g/dL — ABNORMAL LOW (ref 12.0–15.0)
Lymphocytes Relative: 25 % (ref 12.0–46.0)
MCHC: 30.5 g/dL (ref 30.0–36.0)
MCV: 70.8 fl — ABNORMAL LOW (ref 78.0–100.0)
Monocytes Relative: 4 % (ref 3.0–12.0)
Neutrophils Relative %: 60 % (ref 43.0–77.0)
Platelets: 232 10*3/uL (ref 150.0–400.0)
RBC: 4.38 Mil/uL (ref 3.87–5.11)
RDW: 17.7 % — ABNORMAL HIGH (ref 11.5–15.5)
WBC: 4.7 10*3/uL (ref 4.0–10.5)

## 2022-02-12 LAB — LIPID PANEL
Cholesterol: 170 mg/dL (ref 0–200)
HDL: 70.5 mg/dL (ref >39.00)
LDL Cholesterol: 86 mg/dL (ref 0–99)
NonHDL: 99.52
Total CHOL/HDL Ratio: 2
Triglycerides: 70 mg/dL (ref 0.0–149.0)
VLDL: 14 mg/dL (ref 0.0–40.0)

## 2022-02-12 LAB — HEPATIC FUNCTION PANEL
ALT: 15 U/L (ref 0–35)
AST: 22 U/L (ref 0–37)
Albumin: 4.1 g/dL (ref 3.5–5.2)
Alkaline Phosphatase: 34 U/L — ABNORMAL LOW (ref 39–117)
Bilirubin, Direct: 0.1 mg/dL (ref 0.0–0.3)
Total Bilirubin: 0.5 mg/dL (ref 0.2–1.2)
Total Protein: 7.4 g/dL (ref 6.0–8.3)

## 2022-02-12 LAB — TSH: TSH: 1.64 u[IU]/mL (ref 0.35–5.50)

## 2022-02-12 MED ORDER — BUDESONIDE 180 MCG/ACT IN AEPB: 1.0000 | INHALATION_SPRAY | Freq: Four times a day (QID) | RESPIRATORY_TRACT | 1 refills | Status: DC | PRN

## 2022-02-12 MED ORDER — LEVOCETIRIZINE DIHYDROCHLORIDE 5 MG PO TABS: 5.0000 mg | ORAL_TABLET | Freq: Every evening | ORAL | 1 refills | Status: DC

## 2022-02-12 MED ORDER — TRIAMCINOLONE ACETONIDE 55 MCG/ACT NA AERO: 2.0000 | INHALATION_SPRAY | Freq: Every day | NASAL | 1 refills | Status: DC

## 2022-02-12 MED ORDER — ALBUTEROL SULFATE HFA 108 (90 BASE) MCG/ACT IN AERS: 1.0000 | INHALATION_SPRAY | Freq: Four times a day (QID) | RESPIRATORY_TRACT | 5 refills | Status: DC | PRN

## 2022-02-12 NOTE — Patient Instructions (Signed)
Hypertension, Adult High blood pressure (hypertension) is when the force of blood pumping through the arteries is too strong. The arteries are the blood vessels that carry blood from the heart throughout the body. Hypertension forces the heart to work harder to pump blood and may cause arteries to become narrow or stiff. Untreated or uncontrolled hypertension can lead to a heart attack, heart failure, a stroke, kidney disease, and other problems. A blood pressure reading consists of a higher number over a lower number. Ideally, your blood pressure should be below 120/80. The first ("top") number is called the systolic pressure. It is a measure of the pressure in your arteries as your heart beats. The second ("bottom") number is called the diastolic pressure. It is a measure of the pressure in your arteries as the heart relaxes. What are the causes? The exact cause of this condition is not known. There are some conditions that result in high blood pressure. What increases the risk? Certain factors may make you more likely to develop high blood pressure. Some of these risk factors are under your control, including: Smoking. Not getting enough exercise or physical activity. Being overweight. Having too much fat, sugar, calories, or salt (sodium) in your diet. Drinking too much alcohol. Other risk factors include: Having a personal history of heart disease, diabetes, high cholesterol, or kidney disease. Stress. Having a family history of high blood pressure and high cholesterol. Having obstructive sleep apnea. Age. The risk increases with age. What are the signs or symptoms? High blood pressure may not cause symptoms. Very high blood pressure (hypertensive crisis) may cause: Headache. Fast or irregular heartbeats (palpitations). Shortness of breath. Nosebleed. Nausea and vomiting. Vision changes. Severe chest pain, dizziness, and seizures. How is this diagnosed? This condition is diagnosed by  measuring your blood pressure while you are seated, with your arm resting on a flat surface, your legs uncrossed, and your feet flat on the floor. The cuff of the blood pressure monitor will be placed directly against the skin of your upper arm at the level of your heart. Blood pressure should be measured at least twice using the same arm. Certain conditions can cause a difference in blood pressure between your right and left arms. If you have a high blood pressure reading during one visit or you have normal blood pressure with other risk factors, you may be asked to: Return on a different day to have your blood pressure checked again. Monitor your blood pressure at home for 1 week or longer. If you are diagnosed with hypertension, you may have other blood or imaging tests to help your health care provider understand your overall risk for other conditions. How is this treated? This condition is treated by making healthy lifestyle changes, such as eating healthy foods, exercising more, and reducing your alcohol intake. You may be referred for counseling on a healthy diet and physical activity. Your health care provider may prescribe medicine if lifestyle changes are not enough to get your blood pressure under control and if: Your systolic blood pressure is above 130. Your diastolic blood pressure is above 80. Your personal target blood pressure may vary depending on your medical conditions, your age, and other factors. Follow these instructions at home: Eating and drinking  Eat a diet that is high in fiber and potassium, and low in sodium, added sugar, and fat. An example of this eating plan is called the DASH diet. DASH stands for Dietary Approaches to Stop Hypertension. To eat this way: Eat   plenty of fresh fruits and vegetables. Try to fill one half of your plate at each meal with fruits and vegetables. Eat whole grains, such as whole-wheat pasta, brown rice, or whole-grain bread. Fill about one  fourth of your plate with whole grains. Eat or drink low-fat dairy products, such as skim milk or low-fat yogurt. Avoid fatty cuts of meat, processed or cured meats, and poultry with skin. Fill about one fourth of your plate with lean proteins, such as fish, chicken without skin, beans, eggs, or tofu. Avoid pre-made and processed foods. These tend to be higher in sodium, added sugar, and fat. Reduce your daily sodium intake. Many people with hypertension should eat less than 1,500 mg of sodium a day. Do not drink alcohol if: Your health care provider tells you not to drink. You are pregnant, may be pregnant, or are planning to become pregnant. If you drink alcohol: Limit how much you have to: 0-1 drink a day for women. 0-2 drinks a day for men. Know how much alcohol is in your drink. In the [place] one drink equals one 12 oz bottle of beer (355 mL), one 5 oz glass of wine (148 mL), or one 1 oz glass of hard liquor (44 mL). Lifestyle  Work with your health care provider to maintain a healthy body weight or to lose weight. Ask what an ideal weight is for you. Get at least 30 minutes of exercise that causes your heart to beat faster (aerobic exercise) most days of the week. Activities may include walking, swimming, or biking. Include exercise to strengthen your muscles (resistance exercise), such as Pilates or lifting weights, as part of your weekly exercise routine. Try to do these types of exercises for 30 minutes at least 3 days a week. Do not use any products that contain nicotine or tobacco. These products include cigarettes, chewing tobacco, and vaping devices, such as e-cigarettes. If you need help quitting, ask your health care provider. Monitor your blood pressure at home as told by your health care provider. Keep all follow-up visits. This is important. Medicines Take over-the-counter and prescription medicines only as told by your health care provider. Follow directions carefully. Blood  pressure medicines must be taken as prescribed. Do not skip doses of blood pressure medicine. Doing this puts you at risk for problems and can make the medicine less effective. Ask your health care provider about side effects or reactions to medicines that you should watch for. Contact a health care provider if you: Think you are having a reaction to a medicine you are taking. Have headaches that keep coming back (recurring). Feel dizzy. Have swelling in your ankles. Have trouble with your vision. Get help right away if you: Develop a severe headache or confusion. Have unusual weakness or numbness. Feel faint. Have severe pain in your chest or abdomen. Vomit repeatedly. Have trouble breathing. These symptoms may be an emergency. Get help right away. Call 911. Do not wait to see if the symptoms will go away. Do not drive yourself to the hospital. Summary Hypertension is when the force of blood pumping through your arteries is too strong. If this condition is not controlled, it may put you at risk for serious complications. Your personal target blood pressure may vary depending on your medical conditions, your age, and other factors. For most people, a normal blood pressure is less than 120/80. Hypertension is treated with lifestyle changes, medicines, or a combination of both. Lifestyle changes include losing weight, eating a healthy,   low-sodium diet, exercising more, and limiting alcohol. This information is not intended to replace advice given to you by your health care provider. Make sure you discuss any questions you have with your health care provider. Document Revised: 04/16/2021 Document Reviewed: 04/16/2021 Elsevier Patient Education  2023 [employer]  

## 2022-02-12 NOTE — Progress Notes (Signed)
Subjective:  Patient ID: Jessica Thompson, female    DOB: 1951-01-14  Age: 71 y.o. MRN: [id number]  CC: Hypertension, Anemia, and Hyperlipidemia   HPI Jessica Thompson presents for f/up -  She recently took Sudafed for nasal congestion and it caused a spike in her blood pressure.  She is no longer taking Sudafed but needs an alternative for nasal congestion.  She denies chest pain, diaphoresis, or edema.  Her shortness of breath is well controlled.  Outpatient Medications Prior to Visit  Medication Sig Dispense Refill   CALCIUM CARB-CHOLECALCIFEROL PO Take 2 tablets by mouth daily. '650mg'$  / 1000 units (per tablet)     guaiFENesin (MUCINEX) 600 MG 12 hr tablet Take 600 mg by mouth daily.     guaiFENesin-dextromethorphan (ROBITUSSIN DM) 100-10 MG/5ML syrup Take 5 mLs by mouth every 4 (four) hours as needed for cough. 118 mL 0   ipratropium-albuterol (DUONEB) 0.5-2.5 (3) MG/3ML SOLN Take 3 mLs by nebulization every 6 (six) hours as needed. 360 mL 1   montelukast (SINGULAIR) 10 MG tablet TAKE 1 TABLET BY MOUTH EVERYDAY AT BEDTIME 90 tablet 1   Multiple Vitamins-Minerals (CENTRUM SILVER ADULT 50+ PO) Take 1 tablet by mouth daily.     rosuvastatin (CRESTOR) 5 MG tablet TAKE 1 TABLET (5 MG TOTAL) BY MOUTH DAILY. 90 tablet 1   sodium chloride (OCEAN) 0.65 % SOLN nasal spray Place 2 sprays into both nostrils daily.     albuterol (VENTOLIN HFA) 108 (90 Base) MCG/ACT inhaler Inhale 1-2 puffs into the lungs every 6 (six) hours as needed for wheezing or shortness of breath. 18 g 5   budesonide (PULMICORT) 180 MCG/ACT inhaler Inhale 1 puff into the lungs every 6 (six) hours as needed (wheezing). 3 each 1   loratadine (CLARITIN) 10 MG tablet Take 10 mg by mouth daily.     triamcinolone (NASACORT) 55 MCG/ACT AERO nasal inhaler Place 2 sprays into the nose daily. Reported on 06/26/2015     No facility-administered medications prior to visit.    ROS Review of Systems  Constitutional:  Negative for  diaphoresis and fatigue.  HENT: Negative.    Eyes: Negative.   Respiratory:  Positive for shortness of breath. Negative for cough, chest tightness and wheezing.   Cardiovascular:  Negative for chest pain, palpitations and leg swelling.  Gastrointestinal:  Negative for abdominal pain, constipation, diarrhea, nausea and vomiting.  Endocrine: Negative.   Genitourinary: Negative.   Musculoskeletal: Negative.  Negative for myalgias.  Skin: Negative.   Neurological: Negative.  Negative for dizziness and weakness.  Hematological:  Negative for adenopathy. Does not bruise/bleed easily.  Psychiatric/Behavioral: Negative.  Negative for self-injury.     Objective:  BP (!) 146/86 (BP Location: Left Arm, Patient Position: Sitting, Cuff Size: Normal)   Pulse 76   Temp 98.1 F (36.7 C) (Oral)   Ht 5' 3.82" (1.621 m)   Wt 113 lb (51.3 kg)   SpO2 96%   BMI 19.51 kg/m   BP Readings from Last 3 Encounters:  02/12/22 (!) 146/86  01/21/22 132/80  12/25/21 120/60    Wt Readings from Last 3 Encounters:  02/12/22 113 lb (51.3 kg)  01/21/22 114 lb 12.8 oz (52.1 kg)  12/25/21 114 lb (51.7 kg)    Physical Exam Vitals reviewed.  Constitutional:      Appearance: She is not ill-appearing.  Eyes:     General: No scleral icterus.    Conjunctiva/sclera: Conjunctivae normal.  Cardiovascular:     Rate and  Rhythm: Normal rate and regular rhythm.     Heart sounds: No murmur heard. Pulmonary:     Effort: Pulmonary effort is normal.     Breath sounds: No stridor. No wheezing, rhonchi or rales.  Abdominal:     General: Abdomen is flat.     Palpations: There is no mass.     Tenderness: There is no abdominal tenderness. There is no guarding.     Hernia: No hernia is present.  Musculoskeletal:     Cervical back: Neck supple.  Lymphadenopathy:     Cervical: No cervical adenopathy.  Neurological:     Mental Status: She is alert.     Lab Results  Component Value Date   WBC 4.7 02/12/2022   HGB  9.4 (L) 02/12/2022   HCT 31.0 (L) 02/12/2022   PLT 232.0 02/12/2022   GLUCOSE 84 08/14/2021   CHOL 170 02/12/2022   TRIG 70.0 02/12/2022   HDL 70.50 02/12/2022   LDLCALC 86 02/12/2022   ALT 15 02/12/2022   AST 22 02/12/2022   NA 138 08/14/2021   K 4.2 08/14/2021   CL 103 08/14/2021   CREATININE 0.90 08/14/2021   BUN 16 08/14/2021   CO2 32 08/14/2021   TSH 1.64 02/12/2022   HGBA1C 5.8 08/14/2021    DG Chest Port 1 View  Result Date: 07/24/2021 CLINICAL DATA:  Shortness of breath, asthma, wheezing. EXAM: PORTABLE CHEST 1 VIEW COMPARISON:  05/30/2021. FINDINGS: The heart size and mediastinal contours are within normal limits. Aortic atherosclerosis is noted. No consolidation, effusion, or pneumothorax. No acute osseous abnormality. IMPRESSION: No active disease. Electronically Signed   By: Brett Fairy M.D.   On: 07/24/2021 20:36    Assessment & Plan:   Jessica Thompson was seen today for hypertension, anemia and hyperlipidemia.  Diagnoses and all orders for this visit:  Thalassemia trait- Her H/H have declined some. Will continue to monitor. -     CBC with Differential/Platelet; Future -     CBC with Differential/Platelet  Pure hypercholesterolemia -     Lipid panel; Future -     TSH; Future -     Hepatic function panel; Future -     Hepatic function panel -     TSH -     Lipid panel  Hyperlipidemia LDL goal <100 -     Lipid panel; Future -     TSH; Future -     Hepatic function panel; Future -     Hepatic function panel -     TSH -     Lipid panel  Mild intermittent asthma without complication -     albuterol (VENTOLIN HFA) 108 (90 Base) MCG/ACT inhaler; Inhale 1-2 puffs into the lungs every 6 (six) hours as needed for wheezing or shortness of breath. -     budesonide (PULMICORT) 180 MCG/ACT inhaler; Inhale 1 puff into the lungs every 6 (six) hours as needed (wheezing).  Allergic rhinitis, unspecified seasonality, unspecified trigger -     levocetirizine (XYZAL) 5 MG  tablet; Take 1 tablet (5 mg total) by mouth every evening. -     triamcinolone (NASACORT) 55 MCG/ACT AERO nasal inhaler; Place 2 sprays into the nose daily. Reported on 06/26/2015   I have discontinued Jessica Thompson. Jessica Thompson's loratadine. I am also having her start on levocetirizine. Additionally, I am having her maintain her Multiple Vitamins-Minerals (CENTRUM SILVER ADULT 50+ PO), guaiFENesin, sodium chloride, CALCIUM CARB-CHOLECALCIFEROL PO, ipratropium-albuterol, guaiFENesin-dextromethorphan, rosuvastatin, montelukast, albuterol, budesonide, and triamcinolone.  Meds ordered this  encounter  Medications   albuterol (VENTOLIN HFA) 108 (90 Base) MCG/ACT inhaler    Sig: Inhale 1-2 puffs into the lungs every 6 (six) hours as needed for wheezing or shortness of breath.    Dispense:  18 g    Refill:  5   levocetirizine (XYZAL) 5 MG tablet    Sig: Take 1 tablet (5 mg total) by mouth every evening.    Dispense:  90 tablet    Refill:  1   budesonide (PULMICORT) 180 MCG/ACT inhaler    Sig: Inhale 1 puff into the lungs every 6 (six) hours as needed (wheezing).    Dispense:  3 each    Refill:  1   triamcinolone (NASACORT) 55 MCG/ACT AERO nasal inhaler    Sig: Place 2 sprays into the nose daily. Reported on 06/26/2015    Dispense:  50.7 mL    Refill:  1     Follow-up: Return in about 3 months (around 05/15/2022).  Scarlette Calico, MD

## 2022-02-13 ENCOUNTER — Encounter: Payer: Self-pay | Admitting: Internal Medicine

## 2022-04-29 ENCOUNTER — Ambulatory Visit (INDEPENDENT_AMBULATORY_CARE_PROVIDER_SITE_OTHER): Payer: Medicare HMO | Admitting: Internal Medicine

## 2022-04-29 ENCOUNTER — Encounter: Payer: Self-pay | Admitting: Internal Medicine

## 2022-04-29 VITALS — BP 132/82 | HR 63 | Temp 97.6°F | Ht 63.8 in | Wt 113.0 lb

## 2022-04-29 DIAGNOSIS — J418 Mixed simple and mucopurulent chronic bronchitis: Secondary | ICD-10-CM | POA: Insufficient documentation

## 2022-04-29 DIAGNOSIS — E785 Hyperlipidemia, unspecified: Secondary | ICD-10-CM | POA: Diagnosis not present

## 2022-04-29 DIAGNOSIS — J4520 Mild intermittent asthma, uncomplicated: Secondary | ICD-10-CM | POA: Diagnosis not present

## 2022-04-29 LAB — LIPID PANEL
Cholesterol: 149 mg/dL (ref 0–200)
HDL: 73.5 mg/dL (ref >39.00)
LDL Cholesterol: 62 mg/dL (ref 0–99)
NonHDL: 75.69
Total CHOL/HDL Ratio: 2
Triglycerides: 66 mg/dL (ref 0.0–149.0)
VLDL: 13.2 mg/dL (ref 0.0–40.0)

## 2022-04-29 MED ORDER — REVEFENACIN 175 MCG/3ML IN SOLN: 175.0000 ug | Freq: Every day | RESPIRATORY_TRACT | 1 refills | Status: AC

## 2022-04-29 MED ORDER — ALBUTEROL SULFATE (2.5 MG/3ML) 0.083% IN NEBU: 2.5000 mg | INHALATION_SOLUTION | Freq: Four times a day (QID) | RESPIRATORY_TRACT | 5 refills | Status: DC | PRN

## 2022-04-29 NOTE — Patient Instructions (Signed)
Chronic Obstructive Pulmonary Disease  Chronic obstructive pulmonary disease (COPD) is a long-term (chronic) condition that affects the lungs. COPD is a general term that can be used to describe many different lung problems that cause lung inflammation and limit airflow, including chronic bronchitis and emphysema. If you have COPD, your lung function will probably never return to normal. In most cases, it gets worse over time. However, there are steps you can take to slow the progression of the disease and improve your quality of life. What are the causes? This condition may be caused by: Smoking. This is the most common cause. Certain genes passed down through families. What increases the risk? The following factors may make you more likely to develop this condition: Being exposed to secondhand smoke from cigarettes, pipes, or cigars. Being exposed to chemicals and other irritants, such as fumes and dust in the work environment. Having chronic lung conditions or infections. What are the signs or symptoms? Symptoms of this condition include: Shortness of breath, especially during physical activity. Chronic cough with a large amount of thick mucus. Sometimes, the cough may not have any mucus (dry cough). Wheezing and rapid breathing. Gray or bluish discoloration (cyanosis) of the skin, especially in the fingers, toes, or lips. Feeling tired (fatigue). Weight loss. Chest tightness. Frequent infections. Episodes when breathing symptoms become much worse (exacerbations). At the later stages of this disease, you may have swelling in the ankles, feet, or legs. How is this diagnosed? This condition is diagnosed based on: Your medical history. A physical exam. You may also have tests, including: Lung (pulmonary) function tests. This may include a spirometry test, which measures your ability to exhale properly. Chest X-ray. CT scan. Blood tests. How is this treated? This condition may be  treated with: Medicines. These may include inhaled rescue medicines to treat acute exacerbations as well as medicines that you take long-term (maintenance medicines) to prevent flare-ups of COPD. Bronchodilators help treat COPD by dilating the airways to allow increased airflow and make your breathing more comfortable. Steroids can reduce airway inflammation and help prevent exacerbations. Smoking cessation. If you smoke, your health care provider may ask you to quit, and may also recommend therapy or replacement products to help you quit. Pulmonary rehabilitation. This may involve working with a team of health care providers and specialists, such as respiratory, occupational, and physical therapists. Exercise and physical activity. These are beneficial for nearly all people with COPD. Nutrition therapy to gain weight, if you are underweight. Oxygen. Supplemental oxygen therapy is only helpful if you have a low oxygen level in your blood (hypoxemia). Lung surgery or transplant. Palliative care. This is to help people with COPD feel comfortable when treatment is no longer working. Follow these instructions at home: Medicines Take over-the-counter and prescription medicines only as told by your health care provider. This includes inhaled medicines and pills. Talk to your health care provider before taking any cough or allergy medicines. You may need to avoid certain medicines that dry out your airways. Lifestyle If you smoke, the most important thing that you can do is to stop smoking. Continuing to smoke will cause the disease to progress faster. Do not use any products that contain nicotine or tobacco. These products include cigarettes, chewing tobacco, and vaping devices, such as e-cigarettes. If you need help quitting, ask your health care provider. Avoid exposure to things that irritate your lungs, such as smoke, chemicals, and fumes. Stay active, but balance activity with periods of rest.  Exercise and   physical activity will help you maintain your ability to do things you want to do. Learn and use relaxation techniques to manage stress and to control your breathing. Get the right amount of sleep and get quality sleep. Most adults need 7 or more hours per night. Eat healthy foods. Eating smaller, more frequent meals and resting before meals may help you maintain your strength. Controlled breathing Learn and use controlled breathing techniques as directed by your health care provider. Controlled breathing techniques include: Pursed lip breathing. Start by breathing in (inhaling) through your nose for 1 second. Then, purse your lips as if you were going to whistle and breathe out (exhale) through the pursed lips for 2 seconds. Diaphragmatic breathing. Start by putting one hand on your abdomen just above your waist. Inhale slowly through your nose. The hand on your abdomen should move out. Then purse your lips and exhale slowly. You should be able to feel the hand on your abdomen moving in as you exhale.  Controlled coughing Learn and use controlled coughing to clear mucus from your lungs. Controlled coughing is a series of short, progressive coughs. The steps of controlled coughing are: Lean your head slightly forward. Breathe in deeply using diaphragmatic breathing. Try to hold your breath for 3 seconds. Keep your mouth slightly open while coughing twice. Spit any mucus out into a tissue. Rest and repeat the steps once or twice as needed. General instructions Make sure you receive all the vaccines that your health care provider recommends, especially the pneumococcal and influenza vaccines. Preventing infection and hospitalization is very important when you have COPD. Drink enough fluid to keep your urine pale yellow, unless you have a medical condition that requires fluid restriction. Use oxygen therapy and pulmonary rehabilitation if told by your health care provider. If you  require home oxygen therapy, ask your health care provider whether you should purchase a pulse oximeter to measure your oxygen level at home. Work with your health care provider to develop a COPD action plan. This will help you know what steps to take if your condition gets worse. Keep other chronic health conditions under control as told by your health care provider. Avoid extreme temperature and humidity changes. Avoid contact with people who have an illness that spreads from person to person (is contagious), such as viral infections or pneumonia. Keep all follow-up visits. This is important. Contact a health care provider if: You are coughing up more mucus than usual. There is a change in the color or thickness of your mucus. Your breathing is more labored than usual. Your breathing is faster than usual. You have difficulty sleeping. You need to use your rescue medicines or inhalers more often than expected. You have trouble doing routine activities such as getting dressed or walking around the house. Get help right away if: You have shortness of breath while you are resting. You have shortness of breath that prevents you from: Being able to talk. Performing your usual physical activities. You have chest pain lasting longer than 5 minutes. Your skin color is more blue (cyanotic) than usual. You measure low oxygen saturations for longer than 5 minutes with a pulse oximeter. You have a fever. You feel too tired to breathe normally. These symptoms may represent a serious problem that is an emergency. Do not wait to see if the symptoms will go away. Get medical help right away. Call your local emergency services (911 in the [place] Do not drive yourself to the hospital. Summary Chronic obstructive   pulmonary disease (COPD) is a long-term (chronic) condition that affects the lungs. Your lung function will probably never return to normal. In most cases, it gets worse over time. However, there  are steps you can take to slow the progression of the disease and improve your quality of life. Treatment for COPD may include taking medicines, quitting smoking, pulmonary rehabilitation, and changes to diet and exercise. As the disease progresses, you may need oxygen therapy, a lung transplant, or palliative care. To help manage your condition, do not smoke, avoid exposure to things that irritate your lungs, stay up to date on all vaccines, and follow your health care provider's instructions for taking medicines. This information is not intended to replace advice given to you by your health care provider. Make sure you discuss any questions you have with your health care provider. Document Revised: 04/17/2020 Document Reviewed: 04/17/2020 Elsevier Patient Education  2023 [employer]  

## 2022-04-29 NOTE — Progress Notes (Signed)
Subjective:  Patient ID: Jessica Thompson, female    DOB: 11/14/1950  Age: 71 y.o. MRN: [id number]  CC: COPD   HPI RAJNI HOLSWORTH presents for f/up -  She complains of chronic cough that is intermittently productive of light yellow phlegm.  Outpatient Medications Prior to Visit  Medication Sig Dispense Refill   albuterol (VENTOLIN HFA) 108 (90 Base) MCG/ACT inhaler Inhale 1-2 puffs into the lungs every 6 (six) hours as needed for wheezing or shortness of breath. 18 g 5   budesonide (PULMICORT) 180 MCG/ACT inhaler Inhale 1 puff into the lungs every 6 (six) hours as needed (wheezing). 3 each 1   CALCIUM CARB-CHOLECALCIFEROL PO Take 2 tablets by mouth daily. '650mg'$  / 1000 units (per tablet)     guaiFENesin (MUCINEX) 600 MG 12 hr tablet Take 600 mg by mouth daily.     guaiFENesin-dextromethorphan (ROBITUSSIN DM) 100-10 MG/5ML syrup Take 5 mLs by mouth every 4 (four) hours as needed for cough. 118 mL 0   montelukast (SINGULAIR) 10 MG tablet TAKE 1 TABLET BY MOUTH EVERYDAY AT BEDTIME 90 tablet 1   Multiple Vitamins-Minerals (CENTRUM SILVER ADULT 50+ PO) Take 1 tablet by mouth daily.     rosuvastatin (CRESTOR) 5 MG tablet TAKE 1 TABLET (5 MG TOTAL) BY MOUTH DAILY. 90 tablet 1   sodium chloride (OCEAN) 0.65 % SOLN nasal spray Place 2 sprays into both nostrils daily.     triamcinolone (NASACORT) 55 MCG/ACT AERO nasal inhaler Place 2 sprays into the nose daily. Reported on 06/26/2015 50.7 mL 1   ipratropium-albuterol (DUONEB) 0.5-2.5 (3) MG/3ML SOLN Take 3 mLs by nebulization every 6 (six) hours as needed. 360 mL 1   levocetirizine (XYZAL) 5 MG tablet Take 1 tablet (5 mg total) by mouth every evening. 90 tablet 1   No facility-administered medications prior to visit.    ROS Review of Systems  Constitutional:  Negative for chills, diaphoresis, fatigue and fever.  HENT: Negative.  Negative for sore throat.   Eyes: Negative.   Respiratory:  Positive for cough and shortness of breath.  Negative for chest tightness and wheezing.   Cardiovascular:  Negative for chest pain, palpitations and leg swelling.  Gastrointestinal:  Negative for abdominal pain, diarrhea, nausea and vomiting.  Endocrine: Negative.   Genitourinary: Negative.  Negative for difficulty urinating.  Musculoskeletal:  Negative for arthralgias and myalgias.  Skin: Negative.  Negative for color change and pallor.  Neurological: Negative.  Negative for dizziness, weakness and light-headedness.  Hematological:  Negative for adenopathy. Does not bruise/bleed easily.  Psychiatric/Behavioral:  Negative for behavioral problems. The patient is not hyperactive.     Objective:  BP 132/82 (BP Location: Left Arm, Patient Position: Sitting, Cuff Size: Normal)   Pulse 63   Temp 97.6 F (36.4 C) (Oral)   Ht 5' 3.8" (1.621 m)   Wt 113 lb (51.3 kg)   SpO2 96%   BMI 19.52 kg/m   BP Readings from Last 3 Encounters:  04/29/22 132/82  02/12/22 (!) 146/86  01/21/22 132/80    Wt Readings from Last 3 Encounters:  04/29/22 113 lb (51.3 kg)  02/12/22 113 lb (51.3 kg)  01/21/22 114 lb 12.8 oz (52.1 kg)    Physical Exam Vitals reviewed.  HENT:     Nose: Nose normal.     Mouth/Throat:     Mouth: Mucous membranes are moist.  Eyes:     General: No scleral icterus.    Conjunctiva/sclera: Conjunctivae normal.  Cardiovascular:  Rate and Rhythm: Normal rate and regular rhythm.     Heart sounds: No murmur heard.    No gallop.  Pulmonary:     Effort: Pulmonary effort is normal. No tachypnea, accessory muscle usage or respiratory distress.     Breath sounds: Examination of the right-upper field reveals decreased breath sounds and rhonchi. Examination of the left-upper field reveals decreased breath sounds and rhonchi. Examination of the right-middle field reveals decreased breath sounds and rhonchi. Examination of the left-middle field reveals decreased breath sounds and rhonchi. Examination of the right-lower field  reveals decreased breath sounds and rhonchi. Examination of the left-lower field reveals decreased breath sounds and rhonchi. Decreased breath sounds and rhonchi present. No wheezing or rales.     Comments: Exp rhonchi Abdominal:     General: Abdomen is flat.     Palpations: There is no mass.     Tenderness: There is no abdominal tenderness. There is no guarding or rebound.     Hernia: No hernia is present.  Musculoskeletal:        General: Normal range of motion.     Cervical back: Neck supple.     Right lower leg: No edema.     Left lower leg: No edema.  Lymphadenopathy:     Cervical: No cervical adenopathy.  Skin:    General: Skin is warm and dry.  Neurological:     General: No focal deficit present.     Mental Status: She is alert.     Lab Results  Component Value Date   WBC 4.7 02/12/2022   HGB 9.4 (L) 02/12/2022   HCT 31.0 (L) 02/12/2022   PLT 232.0 02/12/2022   GLUCOSE 84 08/14/2021   CHOL 149 04/29/2022   TRIG 66.0 04/29/2022   HDL 73.50 04/29/2022   LDLCALC 62 04/29/2022   ALT 15 02/12/2022   AST 22 02/12/2022   NA 138 08/14/2021   K 4.2 08/14/2021   CL 103 08/14/2021   CREATININE 0.90 08/14/2021   BUN 16 08/14/2021   CO2 32 08/14/2021   TSH 1.64 02/12/2022   HGBA1C 5.8 08/14/2021    DG Chest Port 1 View  Result Date: 07/24/2021 CLINICAL DATA:  Shortness of breath, asthma, wheezing. EXAM: PORTABLE CHEST 1 VIEW COMPARISON:  05/30/2021. FINDINGS: The heart size and mediastinal contours are within normal limits. Aortic atherosclerosis is noted. No consolidation, effusion, or pneumothorax. No acute osseous abnormality. IMPRESSION: No active disease. Electronically Signed   By: Brett Fairy M.D.   On: 07/24/2021 20:36    Assessment & Plan:   Eleora was seen today for copd.  Diagnoses and all orders for this visit:  Mixed simple and mucopurulent chronic bronchitis ([place])- Will add a LAMA for symptom control. -     revefenacin (YUPELRI) 175 MCG/3ML nebulizer  solution; Take 3 mLs (175 mcg total) by nebulization daily. -     albuterol (PROVENTIL) (2.5 MG/3ML) 0.083% nebulizer solution; Take 3 mLs (2.5 mg total) by nebulization every 6 (six) hours as needed for wheezing or shortness of breath.  Mild intermittent asthma without complication -     albuterol (PROVENTIL) (2.5 MG/3ML) 0.083% nebulizer solution; Take 3 mLs (2.5 mg total) by nebulization every 6 (six) hours as needed for wheezing or shortness of breath.  Hyperlipidemia LDL goal <100- LDL goal achieved. Doing well on the statin  -     Lipid panel; Future -     Lipid panel   I have discontinued Derenda Mis. Sculley's ipratropium-albuterol and levocetirizine.  I am also having her start on revefenacin and albuterol. Additionally, I am having her maintain her Multiple Vitamins-Minerals (CENTRUM SILVER ADULT 50+ PO), guaiFENesin, sodium chloride, CALCIUM CARB-CHOLECALCIFEROL PO, guaiFENesin-dextromethorphan, rosuvastatin, montelukast, albuterol, budesonide, and triamcinolone.  Meds ordered this encounter  Medications   revefenacin (YUPELRI) 175 MCG/3ML nebulizer solution    Sig: Take 3 mLs (175 mcg total) by nebulization daily.    Dispense:  90 mL    Refill:  1   albuterol (PROVENTIL) (2.5 MG/3ML) 0.083% nebulizer solution    Sig: Take 3 mLs (2.5 mg total) by nebulization every 6 (six) hours as needed for wheezing or shortness of breath.    Dispense:  150 mL    Refill:  5     Follow-up: Return in about 3 months (around 07/30/2022).  Scarlette Calico, MD

## 2022-04-30 DIAGNOSIS — H25043 Posterior subcapsular polar age-related cataract, bilateral: Secondary | ICD-10-CM | POA: Diagnosis not present

## 2022-04-30 DIAGNOSIS — H2513 Age-related nuclear cataract, bilateral: Secondary | ICD-10-CM | POA: Diagnosis not present

## 2022-04-30 DIAGNOSIS — H524 Presbyopia: Secondary | ICD-10-CM | POA: Diagnosis not present

## 2022-04-30 DIAGNOSIS — H31002 Unspecified chorioretinal scars, left eye: Secondary | ICD-10-CM | POA: Diagnosis not present

## 2022-05-01 ENCOUNTER — Encounter: Payer: Self-pay | Admitting: Internal Medicine

## 2022-05-07 DIAGNOSIS — Z09 Encounter for follow-up examination after completed treatment for conditions other than malignant neoplasm: Secondary | ICD-10-CM | POA: Diagnosis not present

## 2022-05-07 DIAGNOSIS — Z86010 Personal history of colonic polyps: Secondary | ICD-10-CM | POA: Diagnosis not present

## 2022-05-07 DIAGNOSIS — K648 Other hemorrhoids: Secondary | ICD-10-CM | POA: Diagnosis not present

## 2022-05-07 LAB — HM COLONOSCOPY

## 2022-05-12 DIAGNOSIS — Z20822 Contact with and (suspected) exposure to covid-19: Secondary | ICD-10-CM | POA: Diagnosis not present

## 2022-05-12 DIAGNOSIS — R0981 Nasal congestion: Secondary | ICD-10-CM | POA: Diagnosis not present

## 2022-05-13 DIAGNOSIS — Z20822 Contact with and (suspected) exposure to covid-19: Secondary | ICD-10-CM | POA: Diagnosis not present

## 2022-05-13 DIAGNOSIS — R0981 Nasal congestion: Secondary | ICD-10-CM | POA: Diagnosis not present

## 2022-05-16 DIAGNOSIS — R0981 Nasal congestion: Secondary | ICD-10-CM | POA: Diagnosis not present

## 2022-05-16 DIAGNOSIS — Z20822 Contact with and (suspected) exposure to covid-19: Secondary | ICD-10-CM | POA: Diagnosis not present

## 2022-05-17 DIAGNOSIS — Z20822 Contact with and (suspected) exposure to covid-19: Secondary | ICD-10-CM | POA: Diagnosis not present

## 2022-05-17 DIAGNOSIS — R0981 Nasal congestion: Secondary | ICD-10-CM | POA: Diagnosis not present

## 2022-07-02 ENCOUNTER — Ambulatory Visit
Admission: RE | Admit: 2022-07-02 | Discharge: 2022-07-02 | Disposition: A | Discharge status: Home / Self Care | Payer: Medicare HMO | Source: Ambulatory Visit | Attending: Internal Medicine | Admitting: Internal Medicine

## 2022-07-02 DIAGNOSIS — Z780 Asymptomatic menopausal state: Secondary | ICD-10-CM | POA: Diagnosis not present

## 2022-07-02 DIAGNOSIS — M810 Age-related osteoporosis without current pathological fracture: Secondary | ICD-10-CM | POA: Diagnosis not present

## 2022-07-02 DIAGNOSIS — M85832 Other specified disorders of bone density and structure, left forearm: Secondary | ICD-10-CM | POA: Diagnosis not present

## 2022-07-02 DIAGNOSIS — Z13820 Encounter for screening for osteoporosis: Secondary | ICD-10-CM

## 2022-07-11 DIAGNOSIS — H269 Unspecified cataract: Secondary | ICD-10-CM | POA: Diagnosis not present

## 2022-07-11 DIAGNOSIS — Z8249 Family history of ischemic heart disease and other diseases of the circulatory system: Secondary | ICD-10-CM | POA: Diagnosis not present

## 2022-07-11 DIAGNOSIS — Z7952 Long term (current) use of systemic steroids: Secondary | ICD-10-CM | POA: Diagnosis not present

## 2022-07-11 DIAGNOSIS — K5900 Constipation, unspecified: Secondary | ICD-10-CM | POA: Diagnosis not present

## 2022-07-11 DIAGNOSIS — E785 Hyperlipidemia, unspecified: Secondary | ICD-10-CM | POA: Diagnosis not present

## 2022-07-11 DIAGNOSIS — Z008 Encounter for other general examination: Secondary | ICD-10-CM | POA: Diagnosis not present

## 2022-07-11 DIAGNOSIS — Z833 Family history of diabetes mellitus: Secondary | ICD-10-CM | POA: Diagnosis not present

## 2022-07-11 DIAGNOSIS — J45909 Unspecified asthma, uncomplicated: Secondary | ICD-10-CM | POA: Diagnosis not present

## 2022-07-11 DIAGNOSIS — Z681 Body mass index (BMI) 19 or less, adult: Secondary | ICD-10-CM | POA: Diagnosis not present

## 2022-07-11 DIAGNOSIS — Z7951 Long term (current) use of inhaled steroids: Secondary | ICD-10-CM | POA: Diagnosis not present

## 2022-07-30 ENCOUNTER — Encounter: Payer: Self-pay | Admitting: Internal Medicine

## 2022-07-30 ENCOUNTER — Telehealth: Payer: Self-pay | Admitting: Internal Medicine

## 2022-07-30 ENCOUNTER — Ambulatory Visit (INDEPENDENT_AMBULATORY_CARE_PROVIDER_SITE_OTHER): Payer: Medicare HMO | Admitting: Internal Medicine

## 2022-07-30 ENCOUNTER — Telehealth: Payer: Self-pay

## 2022-07-30 ENCOUNTER — Other Ambulatory Visit (HOSPITAL_COMMUNITY): Payer: Self-pay

## 2022-07-30 ENCOUNTER — Other Ambulatory Visit: Payer: Self-pay

## 2022-07-30 VITALS — BP 164/90 | HR 47 | Temp 98.2°F | Ht 63.8 in | Wt 114.0 lb

## 2022-07-30 DIAGNOSIS — R001 Bradycardia, unspecified: Secondary | ICD-10-CM | POA: Diagnosis not present

## 2022-07-30 DIAGNOSIS — Z0001 Encounter for general adult medical examination with abnormal findings: Secondary | ICD-10-CM | POA: Diagnosis not present

## 2022-07-30 DIAGNOSIS — D563 Thalassemia minor: Secondary | ICD-10-CM

## 2022-07-30 DIAGNOSIS — I10 Essential (primary) hypertension: Secondary | ICD-10-CM | POA: Diagnosis not present

## 2022-07-30 DIAGNOSIS — M810 Age-related osteoporosis without current pathological fracture: Secondary | ICD-10-CM

## 2022-07-30 LAB — BASIC METABOLIC PANEL
BUN: 17 mg/dL (ref 6–23)
CO2: 28 mEq/L (ref 19–32)
Calcium: 9.9 mg/dL (ref 8.4–10.5)
Chloride: 102 mEq/L (ref 96–112)
Creatinine, Ser: 0.91 mg/dL (ref 0.40–1.20)
GFR: 62.38 mL/min (ref >60.00)
Glucose, Bld: 88 mg/dL (ref 70–99)
Potassium: 4.3 mEq/L (ref 3.5–5.1)
Sodium: 141 mEq/L (ref 135–145)

## 2022-07-30 LAB — CBC WITH DIFFERENTIAL/PLATELET
Basophils Absolute: 0 10*3/uL (ref 0.0–0.1)
Basophils Relative: 0.9 % (ref 0.0–3.0)
Eosinophils Absolute: 0.4 10*3/uL (ref 0.0–0.7)
Eosinophils Relative: 7.3 % — ABNORMAL HIGH (ref 0.0–5.0)
HCT: 31.9 % — ABNORMAL LOW (ref 36.0–46.0)
Hemoglobin: 10.1 g/dL — ABNORMAL LOW (ref 12.0–15.0)
Lymphocytes Relative: 24 % (ref 12.0–46.0)
Lymphs Abs: 1.2 10*3/uL (ref 0.7–4.0)
MCHC: 31.7 g/dL (ref 30.0–36.0)
MCV: 70 fl — ABNORMAL LOW (ref 78.0–100.0)
Monocytes Absolute: 0.5 10*3/uL (ref 0.1–1.0)
Monocytes Relative: 9.3 % (ref 3.0–12.0)
Neutro Abs: 3 10*3/uL (ref 1.4–7.7)
Neutrophils Relative %: 58.5 % (ref 43.0–77.0)
Platelets: 233 10*3/uL (ref 150.0–400.0)
RBC: 4.55 Mil/uL (ref 3.87–5.11)
RDW: 17.1 % — ABNORMAL HIGH (ref 11.5–15.5)
WBC: 5.1 10*3/uL (ref 4.0–10.5)

## 2022-07-30 LAB — VITAMIN D 25 HYDROXY (VIT D DEFICIENCY, FRACTURES): VITD: 67.89 ng/mL (ref 30.00–100.00)

## 2022-07-30 LAB — TSH: TSH: 1.89 u[IU]/mL (ref 0.35–5.50)

## 2022-07-30 MED ORDER — INDAPAMIDE 1.25 MG PO TABS: 1.2500 mg | ORAL_TABLET | Freq: Every day | ORAL | 0 refills | Status: DC

## 2022-07-30 MED ORDER — DENOSUMAB 60 MG/ML ~~LOC~~ SOSY
60.0000 mg | PREFILLED_SYRINGE | Freq: Once | SUBCUTANEOUS | 1 refills | Status: AC
  Filled 2022-07-30: qty 1, 1d supply, fill #0

## 2022-07-30 NOTE — Progress Notes (Signed)
Subjective:  Patient ID: Jessica Thompson, female    DOB: Jan 12, 1951  Age: 72 y.o. MRN: [id number]  CC: Annual Exam and Hypertension   HPI Jessica Thompson presents for a CPX and f/up -  She walks about 4 miles a day.  Her endurance is good.  She denies chest pain, shortness of breath, palpitations, edema, dizziness, or lightheadedness.  Outpatient Medications Prior to Visit  Medication Sig Dispense Refill   albuterol (PROVENTIL) (2.5 MG/3ML) 0.083% nebulizer solution Take 3 mLs (2.5 mg total) by nebulization every 6 (six) hours as needed for wheezing or shortness of breath. 150 mL 5   albuterol (VENTOLIN HFA) 108 (90 Base) MCG/ACT inhaler Inhale 1-2 puffs into the lungs every 6 (six) hours as needed for wheezing or shortness of breath. 18 g 5   budesonide (PULMICORT) 180 MCG/ACT inhaler Inhale 1 puff into the lungs every 6 (six) hours as needed (wheezing). 3 each 1   CALCIUM CARB-CHOLECALCIFEROL PO Take 2 tablets by mouth daily. 669m / 1000 units (per tablet)     guaiFENesin (MUCINEX) 600 MG 12 hr tablet Take 600 mg by mouth daily.     guaiFENesin-dextromethorphan (ROBITUSSIN DM) 100-10 MG/5ML syrup Take 5 mLs by mouth every 4 (four) hours as needed for cough. 118 mL 0   montelukast (SINGULAIR) 10 MG tablet TAKE 1 TABLET BY MOUTH EVERYDAY AT BEDTIME 90 tablet 1   Multiple Vitamins-Minerals (CENTRUM SILVER ADULT 50+ PO) Take 1 tablet by mouth daily.     revefenacin (YUPELRI) 175 MCG/3ML nebulizer solution Take 3 mLs (175 mcg total) by nebulization daily. 90 mL 1   rosuvastatin (CRESTOR) 5 MG tablet TAKE 1 TABLET (5 MG TOTAL) BY MOUTH DAILY. 90 tablet 1   sodium chloride (OCEAN) 0.65 % SOLN nasal spray Place 2 sprays into both nostrils daily.     triamcinolone (NASACORT) 55 MCG/ACT AERO nasal inhaler Place 2 sprays into the nose daily. Reported on 06/26/2015 50.7 mL 1   No facility-administered medications prior to visit.    ROS Review of Systems  Constitutional:  Negative for  diaphoresis, fatigue and unexpected weight change.  HENT: Negative.    Eyes: Negative.   Respiratory:  Negative for cough, chest tightness, shortness of breath and wheezing.   Cardiovascular:  Negative for chest pain, palpitations and leg swelling.  Gastrointestinal:  Negative for abdominal pain, diarrhea, nausea and vomiting.  Endocrine: Negative.   Genitourinary: Negative.  Negative for difficulty urinating, dysuria and hematuria.  Musculoskeletal: Negative.  Negative for arthralgias, back pain and myalgias.  Skin: Negative.   Neurological: Negative.  Negative for dizziness, weakness and light-headedness.  Hematological:  Negative for adenopathy. Does not bruise/bleed easily.  Psychiatric/Behavioral: Negative.      Objective:  BP (!) 164/90 (BP Location: Right Arm, Patient Position: Sitting, Cuff Size: Normal)   Pulse (!) 47   Temp 98.2 F (36.8 C) (Oral)   Ht 5' 3.8" (1.621 m)   Wt 114 lb (51.7 kg)   SpO2 90%   BMI 19.69 kg/m   BP Readings from Last 3 Encounters:  07/30/22 (!) 164/90  04/29/22 132/82  02/12/22 (!) 146/86    Wt Readings from Last 3 Encounters:  07/30/22 114 lb (51.7 kg)  04/29/22 113 lb (51.3 kg)  02/12/22 113 lb (51.3 kg)    Physical Exam Vitals reviewed.  Constitutional:      Appearance: She is not ill-appearing.  HENT:     Nose: Nose normal.     Mouth/Throat:  Mouth: Mucous membranes are moist.  Eyes:     General: No scleral icterus.    Conjunctiva/sclera: Conjunctivae normal.  Cardiovascular:     Rate and Rhythm: Normal rate and regular rhythm.     Heart sounds: No murmur heard.    No friction rub. No gallop.     Comments: EKG- NSR, 66 bpm LAE No LVH or Q waves Pulmonary:     Effort: Pulmonary effort is normal.     Breath sounds: No stridor. No wheezing, rhonchi or rales.  Abdominal:     General: Abdomen is flat.     Palpations: There is no mass.     Tenderness: There is no abdominal tenderness. There is no guarding or rebound.      Hernia: No hernia is present.  Musculoskeletal:        General: Normal range of motion.     Cervical back: Neck supple.     Right lower leg: No edema.     Left lower leg: No edema.  Lymphadenopathy:     Cervical: No cervical adenopathy.  Skin:    General: Skin is warm and dry.  Neurological:     General: No focal deficit present.     Mental Status: She is alert. Mental status is at baseline.  Psychiatric:        Mood and Affect: Mood normal.        Behavior: Behavior normal.     Lab Results  Component Value Date   WBC 5.1 07/30/2022   HGB 10.1 (L) 07/30/2022   HCT 31.9 (L) 07/30/2022   PLT 233.0 07/30/2022   GLUCOSE 88 07/30/2022   CHOL 149 04/29/2022   TRIG 66.0 04/29/2022   HDL 73.50 04/29/2022   LDLCALC 62 04/29/2022   ALT 15 02/12/2022   AST 22 02/12/2022   NA 141 07/30/2022   K 4.3 07/30/2022   CL 102 07/30/2022   CREATININE 0.91 07/30/2022   BUN 17 07/30/2022   CO2 28 07/30/2022   TSH 1.89 07/30/2022   HGBA1C 5.8 08/14/2021    DEXAScan  Result Date: 07/02/2022 EXAM: DUAL X-RAY ABSORPTIOMETRY (DXA) FOR BONE MINERAL DENSITY IMPRESSION: Referring Physician:  Janith Thompson Your patient completed a bone mineral density test using GE Lunar iDXA system (analysis version: 16). Technologist: sec PATIENT: Name: Jessica, Thompson Patient ID: [id number] Birth Date: 1951/02/02 Height: 63.0 in. Sex: Female Measured: 07/02/2022 Weight: 113.0 lbs. Indications: Advanced Age, Estrogen Deficient, Postmenopausal Fractures: None Treatments: Calcium (E943.0), Vitamin D (E933.5) ASSESSMENT: The BMD measured at Femur Neck Right is 0.694 g/cm2 with a T-score of -2.5. This patient is considered osteoporotic according to [facility]) criteria. The quality of the exam is good. Site Region Measured Date Measured Age YA BMD Significant CHANGE T-score DualFemur Neck Right 07/02/2022 73.8 -2.3 0.694 g/cm2 * DualFemur Neck Right 07/04/2016 67.9 -2.1 0.746 g/cm2 AP Spine  L1-L4 07/02/2022 73.8 0.2 1.250 g/cm2 * AP Spine L1-L4 07/04/2016 67.9 [age] 1.278 g/cm2 DualFemur Total Mean 07/02/2022 73.8 -2.1 0.739 g/cm2 * DualFemur Total Mean 07/04/2016 67.9 -1.7 0.798 g/cm2 Left Forearm Radius 33% 07/02/2022 73.8 -1.4 0.751 g/cm2 World Health Organization [facility]) criteria for post-menopausal, Caucasian Women: Normal       T-score at or above -1 SD Osteopenia   T-score between -1 and -2.5 SD Osteoporosis T-score at or below -2.5 SD RECOMMENDATION: 1. All patients should optimize calcium and vitamin D intake. 2. Consider FDA-approved medical therapies in postmenopausal women and men aged 33 years and older,  based on the following: a. A hip or vertebral (clinical or morphometric) fracture. b. T-score = -2.5 at the femoral neck or spine after appropriate evaluation to exclude secondary causes. c. Low bone mass (T-score between -1.0 and -2.5 at the femoral neck or spine) and a 10-year probability of a hip fracture = 3% or a 10-year probability of a major osteoporosis-related fracture = 20% based on the [place]-adapted WHO algorithm. d. Clinician judgment and/or patient preferences may indicate treatment for people with 10-year fracture probabilities above or below these levels. FOLLOW-UP: Patients with diagnosis of osteoporosis or at high risk for fracture should have regular bone mineral density tests.? Patients eligible for Medicare are allowed routine testing every 2 years.? The testing frequency can be increased to one year for patients who have rapidly progressing disease, are receiving or discontinuing medical therapy to restore bone mass, or have additional risk factors. I have reviewed this study and agree with the findings. [facility] Electronically Signed   By: Zerita Boers M.D.   On: 07/02/2022 14:04    Assessment & Plan:   Jamilet was seen today for annual exam and hypertension.  Diagnoses and all orders for this visit:  Primary hypertension- Her BP is not adequately  well-controlled.  I recommended that she start taking indapamide. -     EKG 12-Lead -     Basic metabolic panel; Future -     TSH; Future -     indapamide (LOZOL) 1.25 MG tablet; Take 1 tablet (1.25 mg total) by mouth daily. -     TSH -     Basic metabolic panel  Bradycardia- Her heart rate is normal on the EKG. -     EKG 12-Lead -     TSH; Future -     TSH  Thalassemia trait- Her H&H are stable. -     CBC with Differential/Platelet; Future -     CBC with Differential/Platelet  Age-related osteoporosis without current pathological fracture -     VITAMIN D 25 Hydroxy (Vit-D Deficiency, Fractures); Future -     VITAMIN D 25 Hydroxy (Vit-D Deficiency, Fractures) -     denosumab (PROLIA) 60 MG/ML SOSY injection; Inject 60 mg into the skin once for 1 dose.  Encounter for general adult medical examination with abnormal findings- Exam completed, labs reviewed, vaccines are up-to-date, cancer screenings are up-to-date, patient education was given.   I am having Derenda Mis. Varnell start on indapamide and denosumab. I am also having her maintain her Multiple Vitamins-Minerals (CENTRUM SILVER ADULT 50+ PO), guaiFENesin, sodium chloride, CALCIUM CARB-CHOLECALCIFEROL PO, guaiFENesin-dextromethorphan, rosuvastatin, montelukast, albuterol, budesonide, triamcinolone, revefenacin, and albuterol.  Meds ordered this encounter  Medications   indapamide (LOZOL) 1.25 MG tablet    Sig: Take 1 tablet (1.25 mg total) by mouth daily.    Dispense:  90 tablet    Refill:  0   denosumab (PROLIA) 60 MG/ML SOSY injection    Sig: Inject 60 mg into the skin once for 1 dose.    Dispense:  1 mL    Refill:  1     Follow-up: Return in about 3 months (around 10/28/2022).  Scarlette Calico, MD

## 2022-07-30 NOTE — Telephone Encounter (Signed)
[facility] out patient call confirming that they can not mail out denosumab (PROLIA) 60 MG/ML SOSY injection Also stated if our office have it on hand they can reach out to the pt,so pt can receive shot in office.

## 2022-07-30 NOTE — Telephone Encounter (Signed)
Even[place] VOB initiated via [email or web address]

## 2022-07-30 NOTE — Telephone Encounter (Signed)
Prolia VOB initiated via [email or web address] 

## 2022-07-30 NOTE — Telephone Encounter (Signed)
Pharmacy Patient Advocate Encounter  Insurance verification completed.    The patient is insured through [employer]   Ran test claims for: Prolia 60mg.  Pharmacy benefit copay: $100.00  

## 2022-07-30 NOTE — Patient Instructions (Signed)

## 2022-07-31 ENCOUNTER — Other Ambulatory Visit (HOSPITAL_COMMUNITY): Payer: Self-pay

## 2022-07-31 NOTE — Telephone Encounter (Signed)
PA initiated via Availity. Status pending

## 2022-08-01 ENCOUNTER — Other Ambulatory Visit: Payer: Self-pay

## 2022-08-01 ENCOUNTER — Other Ambulatory Visit (HOSPITAL_COMMUNITY): Payer: Self-pay

## 2022-08-01 NOTE — Telephone Encounter (Signed)
Spoke to speciality pharmacy at [place] will be sent to our office once received. [facility] will contact pt about copay.

## 2022-08-03 ENCOUNTER — Other Ambulatory Visit: Payer: Self-pay | Admitting: Internal Medicine

## 2022-08-03 DIAGNOSIS — E785 Hyperlipidemia, unspecified: Secondary | ICD-10-CM

## 2022-08-04 ENCOUNTER — Other Ambulatory Visit: Payer: Self-pay

## 2022-08-04 ENCOUNTER — Other Ambulatory Visit (HOSPITAL_COMMUNITY): Payer: Self-pay

## 2022-08-04 ENCOUNTER — Other Ambulatory Visit: Payer: Self-pay | Admitting: Internal Medicine

## 2022-08-04 DIAGNOSIS — M810 Age-related osteoporosis without current pathological fracture: Secondary | ICD-10-CM

## 2022-08-04 MED ORDER — DENOSUMAB 60 MG/ML ~~LOC~~ SOSY
60.0000 mg | PREFILLED_SYRINGE | Freq: Once | SUBCUTANEOUS | 1 refills | Status: AC
  Filled 2022-08-04: qty 1, 1d supply, fill #0

## 2022-08-04 NOTE — Telephone Encounter (Signed)
Pt ready for scheduling on or after 08/04/22  Out-of-pocket cost due at time of visit: $327 (Medical) / $100 (pharmacy) Primary: Medicare Prolia co-insurance: 20% (approximately $302) Admin fee co-insurance: 20% (approximately $25)  Deductible:   Secondary:  Prolia co-insurance:  Admin fee co-insurance:   Deductible:   Prior Auth: APPROVED PA# [id number] Valid: 07/31/22-08/01/23  ** This summary of benefits is an estimation of the patient's out-of-pocket cost. Exact cost may vary based on individual plan coverage.

## 2022-08-04 NOTE — Telephone Encounter (Signed)
Pharmacy Patient Advocate Encounter  Prior Authorization for [place]  has been approved.    PA# [id number] Effective dates: 07/31/2022 through 08/01/2023  [facility] Patient Advocate Letter of approval is in media

## 2022-08-04 NOTE — Telephone Encounter (Signed)
Pharmacy benefit is cheaper for the patient. Send rx to [facility]

## 2022-08-06 IMAGING — MG MM DIGITAL SCREENING BILAT W/ TOMO AND CAD
6 of 10 series · 6 of 30 positions shown · non-contrast
Comparison: Previous exam(s).

CLINICAL DATA: Screening.

EXAM:
DIGITAL SCREENING BILATERAL MAMMOGRAM WITH TOMOSYNTHESIS AND CAD
TECHNIQUE: Bilateral screening digital craniocaudal and mediolateral oblique
mammograms were obtained. Bilateral screening digital breast
tomosynthesis was performed. The images were evaluated with
computer-aided detection.

[R MLO synth-2D (1 of 2)]
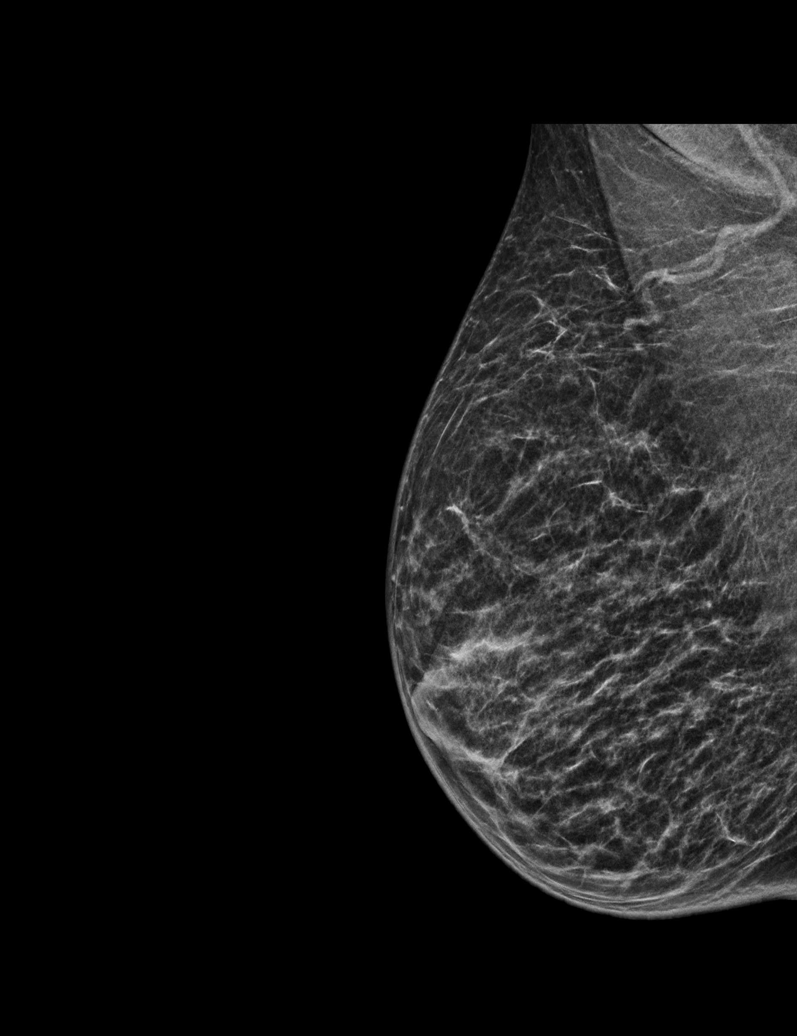

[L MLO synth-2D]
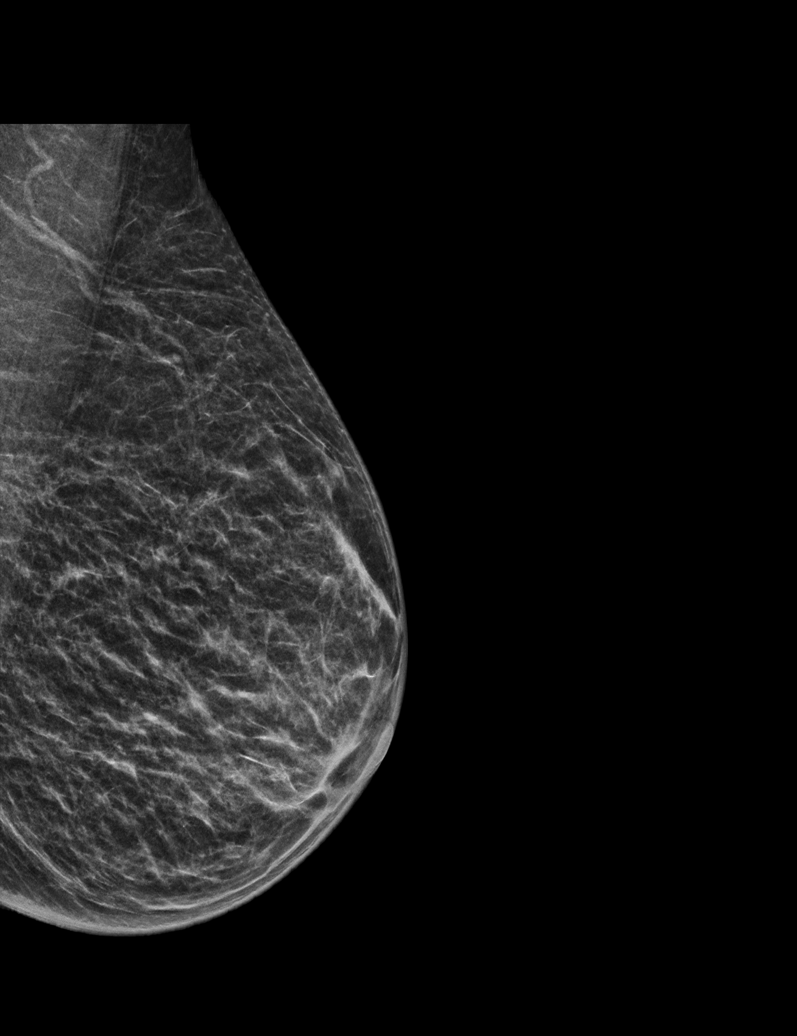

[L CC synth-2D]
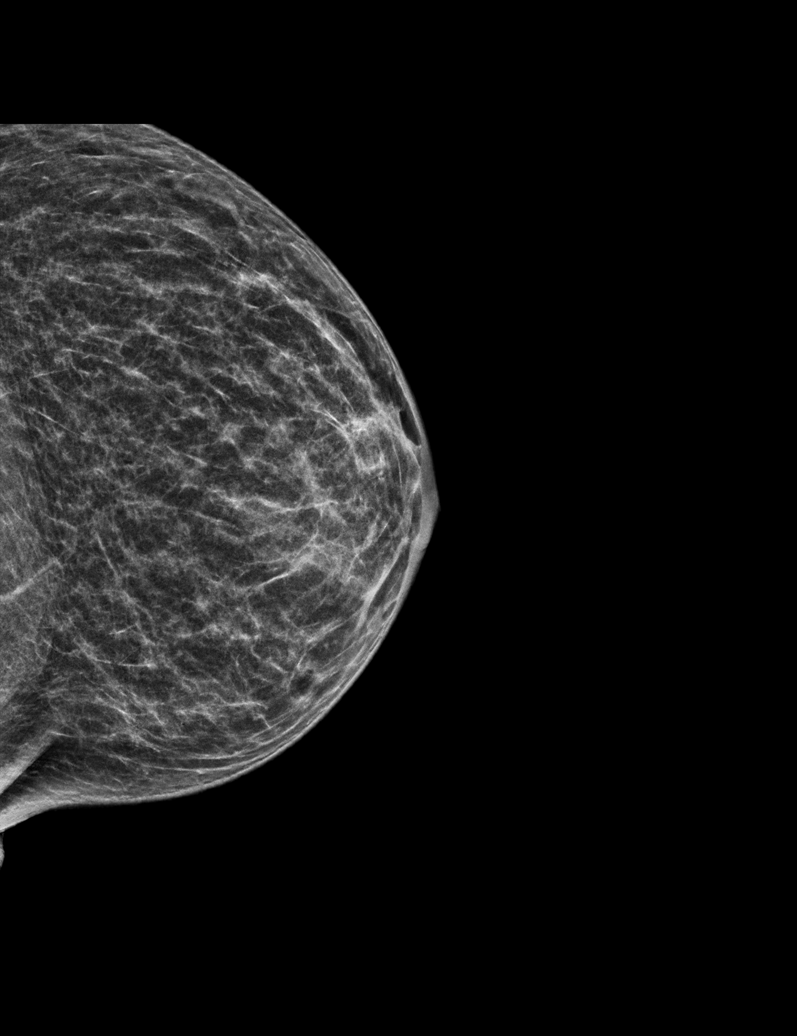

[R CC synth-2D]
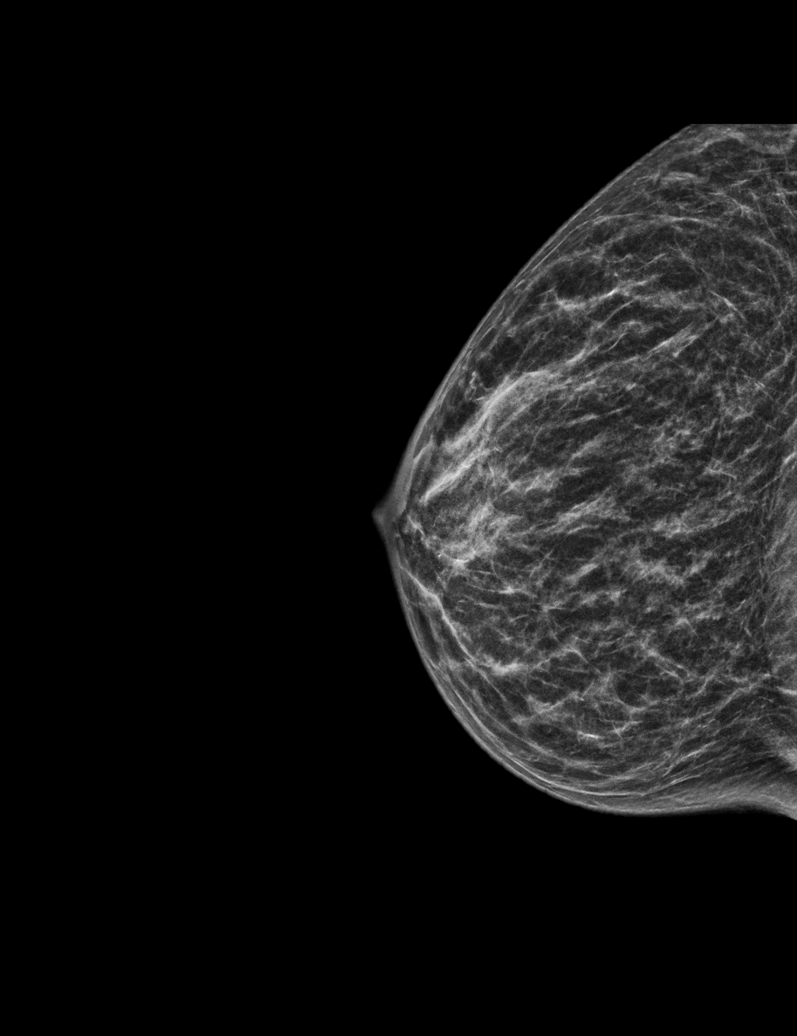

[R MLO synth-2D (2 of 2)]
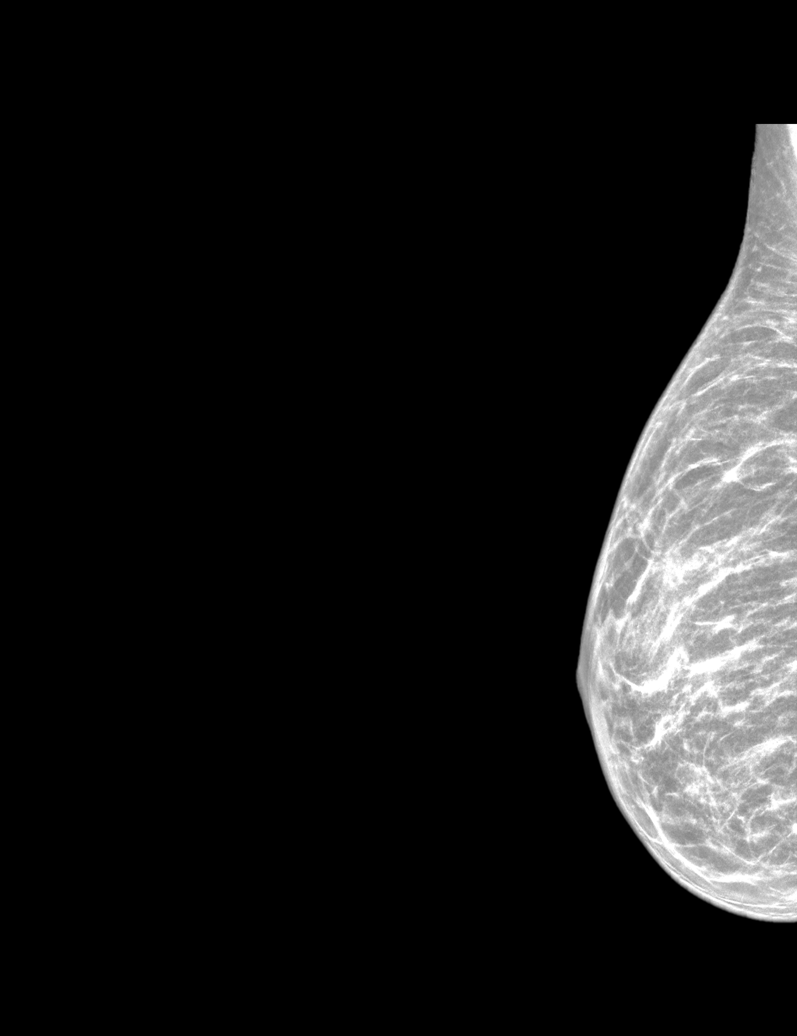

[L CC tomo · tomo slice 21/41.0]
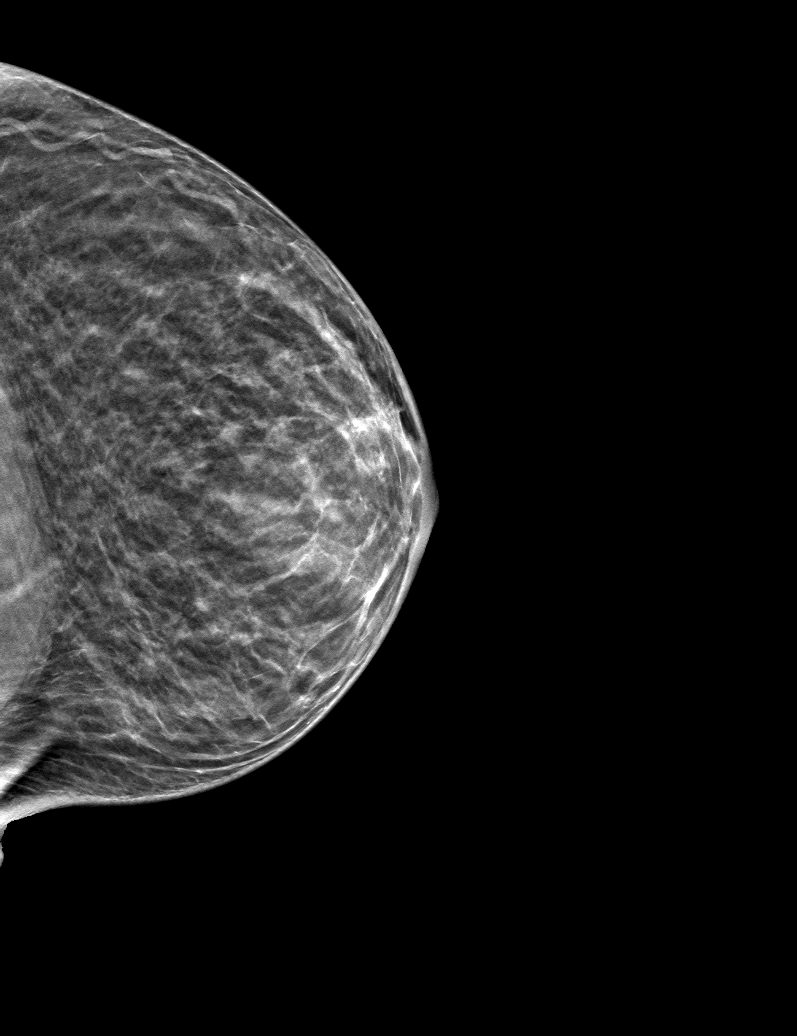

[6 of 30 positions shown; findings below may reference images not displayed]

ACR Breast Density Category b: There are scattered areas of
fibroglandular density.
FINDINGS: There are no findings suspicious for malignancy.
IMPRESSION: No mammographic evidence of malignancy. A result letter of this
screening mammogram will be mailed directly to the patient.

RECOMMENDATION:
Screening mammogram in one year. (Code:51-O-LD2)

BI-RADS CATEGORY  1: Negative.

## 2022-08-08 ENCOUNTER — Encounter: Payer: Self-pay | Admitting: Internal Medicine

## 2022-08-12 ENCOUNTER — Other Ambulatory Visit: Payer: Self-pay | Admitting: Internal Medicine

## 2022-08-12 DIAGNOSIS — I10 Essential (primary) hypertension: Secondary | ICD-10-CM

## 2022-08-12 DIAGNOSIS — J4520 Mild intermittent asthma, uncomplicated: Secondary | ICD-10-CM

## 2022-08-13 ENCOUNTER — Other Ambulatory Visit: Payer: Self-pay | Admitting: Internal Medicine

## 2022-08-13 DIAGNOSIS — J4520 Mild intermittent asthma, uncomplicated: Secondary | ICD-10-CM

## 2022-08-14 IMAGING — DX DG CHEST 1V PORT
1 series · 1 of 1 positions shown · non-contrast
Comparison: 05/30/2021.

CLINICAL DATA: Shortness of breath, asthma, wheezing.

EXAM:
PORTABLE CHEST 1 VIEW

[chest]
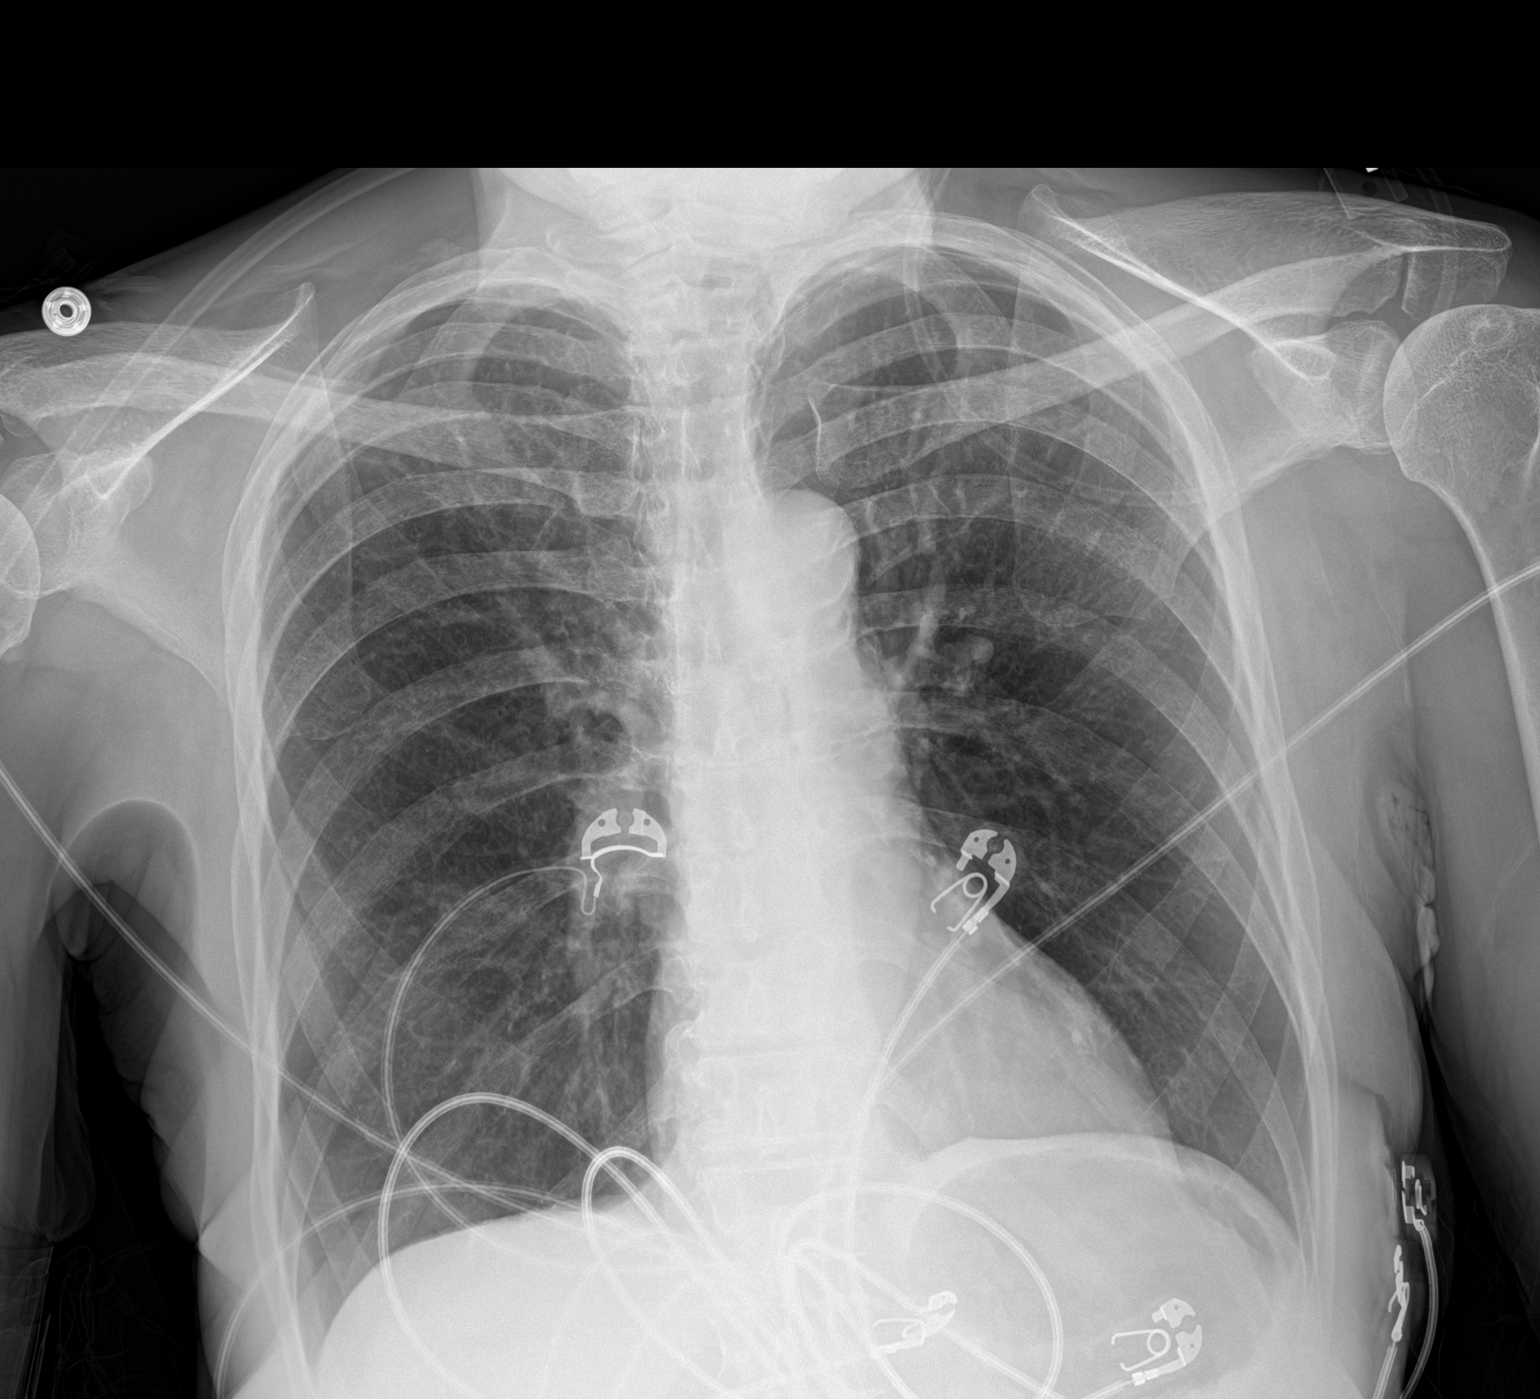

[1 of 1 positions shown; findings below may reference images not displayed]

FINDINGS: The heart size and mediastinal contours are within normal limits.
Aortic atherosclerosis is noted. No consolidation, effusion, or
pneumothorax. No acute osseous abnormality.
IMPRESSION: No active disease.

## 2022-10-22 ENCOUNTER — Telehealth: Payer: Self-pay | Admitting: Internal Medicine

## 2022-10-22 NOTE — Telephone Encounter (Signed)
Contacted Jessica Thompson to schedule their annual wellness visit. Appointment made for 11/05/2022.  Jessica Thompson Care Guide [facility] AWV TEAM Direct Dial: [phone]

## 2022-10-29 ENCOUNTER — Ambulatory Visit (INDEPENDENT_AMBULATORY_CARE_PROVIDER_SITE_OTHER): Payer: Medicare HMO | Admitting: Internal Medicine

## 2022-10-29 ENCOUNTER — Encounter: Payer: Self-pay | Admitting: Internal Medicine

## 2022-10-29 VITALS — BP 136/72 | HR 82 | Temp 98.1°F | Ht 63.8 in | Wt 113.0 lb

## 2022-10-29 DIAGNOSIS — I10 Essential (primary) hypertension: Secondary | ICD-10-CM

## 2022-10-29 DIAGNOSIS — R0609 Other forms of dyspnea: Secondary | ICD-10-CM | POA: Insufficient documentation

## 2022-10-29 DIAGNOSIS — D563 Thalassemia minor: Secondary | ICD-10-CM

## 2022-10-29 LAB — CBC WITH DIFFERENTIAL/PLATELET
Basophils Absolute: 0 10*3/uL (ref 0.0–0.1)
Basophils Relative: 0.8 % (ref 0.0–3.0)
Eosinophils Absolute: 0.4 10*3/uL (ref 0.0–0.7)
Eosinophils Relative: 9.2 % — ABNORMAL HIGH (ref 0.0–5.0)
HCT: 31 % — ABNORMAL LOW (ref 36.0–46.0)
Hemoglobin: 9.6 g/dL — ABNORMAL LOW (ref 12.0–15.0)
Lymphocytes Relative: 27.4 % (ref 12.0–46.0)
Lymphs Abs: 1.3 10*3/uL (ref 0.7–4.0)
MCHC: 30.9 g/dL (ref 30.0–36.0)
MCV: 70.9 fl — ABNORMAL LOW (ref 78.0–100.0)
Monocytes Absolute: 0.5 10*3/uL (ref 0.1–1.0)
Monocytes Relative: 11.1 % (ref 3.0–12.0)
Neutro Abs: 2.4 10*3/uL (ref 1.4–7.7)
Neutrophils Relative %: 51.5 % (ref 43.0–77.0)
Platelets: 222 10*3/uL (ref 150.0–400.0)
RBC: 4.38 Mil/uL (ref 3.87–5.11)
RDW: 17.7 % — ABNORMAL HIGH (ref 11.5–15.5)
WBC: 4.6 10*3/uL (ref 4.0–10.5)

## 2022-10-29 LAB — BRAIN NATRIURETIC PEPTIDE: Pro B Natriuretic peptide (BNP): 59 pg/mL (ref 0.0–100.0)

## 2022-10-29 LAB — TROPONIN I (HIGH SENSITIVITY): High Sens Troponin I: 3 ng/L (ref 2–17)

## 2022-10-29 NOTE — Progress Notes (Signed)
Subjective:  Patient ID: Jessica Thompson, female    DOB: [birth year]  Age: 72 y.o. MRN: [id number]  CC: Hypertension and Anemia   HPI Jessica Thompson presents for f/up ---  She complains of chronic fatigue.  She is active and complains of intermittent dyspnea on exertion and chest pain.  She denies cough, diaphoresis, palpitations, or edema.  Outpatient Medications Prior to Visit  Medication Sig Dispense Refill   albuterol (PROVENTIL) (2.5 MG/3ML) 0.083% nebulizer solution Take 3 mLs (2.5 mg total) by nebulization every 6 (six) hours as needed for wheezing or shortness of breath. 150 mL 5   budesonide (PULMICORT) 180 MCG/ACT inhaler Inhale 1 puff into the lungs every 6 (six) hours as needed (wheezing). 3 each 1   CALCIUM CARB-CHOLECALCIFEROL PO Take 2 tablets by mouth daily. 650mg  / 1000 units (per tablet)     guaiFENesin (MUCINEX) 600 MG 12 hr tablet Take 600 mg by mouth daily.     indapamide (LOZOL) 1.25 MG tablet TAKE 1 TABLET BY MOUTH DAILY. 90 tablet 1   montelukast (SINGULAIR) 10 MG tablet TAKE 1 TABLET BY MOUTH EVERYDAY AT BEDTIME 90 tablet 1   Multiple Vitamins-Minerals (CENTRUM SILVER ADULT 50+ PO) Take 1 tablet by mouth daily.     revefenacin (YUPELRI) 175 MCG/3ML nebulizer solution Take 3 mLs (175 mcg total) by nebulization daily. 90 mL 1   rosuvastatin (CRESTOR) 5 MG tablet TAKE 1 TABLET (5 MG TOTAL) BY MOUTH DAILY. 90 tablet 1   sodium chloride (OCEAN) 0.65 % SOLN nasal spray Place 2 sprays into both nostrils daily.     triamcinolone (NASACORT) 55 MCG/ACT AERO nasal inhaler Place 2 sprays into the nose daily. Reported on 06/26/2015 50.7 mL 1   VENTOLIN HFA 108 (90 Base) MCG/ACT inhaler INHALE 2 PUFFS BY MOUTH EVERY 6 HOURS AS NEEDED FOR WHEEZE OR SHORTNESS OF BREATH 18 each 3   No facility-administered medications prior to visit.    ROS Review of Systems  Constitutional:  Positive for fatigue. Negative for appetite change, chills, diaphoresis and unexpected weight  change.  HENT: Negative.    Eyes: Negative.   Respiratory:  Positive for shortness of breath. Negative for cough, chest tightness and wheezing.   Cardiovascular:  Positive for chest pain. Negative for palpitations and leg swelling.  Gastrointestinal:  Negative for abdominal pain, constipation, diarrhea, nausea and vomiting.  Endocrine: Negative.   Genitourinary: Negative.  Negative for difficulty urinating.  Musculoskeletal:  Negative for myalgias.  Skin: Negative.   Neurological:  Negative for dizziness and weakness.  Hematological:  Negative for adenopathy. Does not bruise/bleed easily.  Psychiatric/Behavioral: Negative.      Objective:  BP 136/72 (BP Location: Left Arm, Patient Position: Sitting, Cuff Size: Normal)   Pulse 82   Temp 98.1 F (36.7 C) (Oral)   Ht 5' 3.8" (1.621 m)   Wt 113 lb (51.3 kg)   SpO2 97%   BMI 19.52 kg/m   BP Readings from Last 3 Encounters:  10/29/22 136/72  07/30/22 (!) 164/90  04/29/22 132/82    Wt Readings from Last 3 Encounters:  10/29/22 113 lb (51.3 kg)  07/30/22 114 lb (51.7 kg)  04/29/22 113 lb (51.3 kg)    Physical Exam Vitals reviewed.  HENT:     Nose: Nose normal.     Mouth/Throat:     Mouth: Mucous membranes are moist.  Eyes:     General: No scleral icterus.    Conjunctiva/sclera: Conjunctivae normal.  Cardiovascular:  Rate and Rhythm: Normal rate and regular rhythm.     Pulses: Normal pulses.     Heart sounds: No murmur heard.    No gallop.  Pulmonary:     Effort: Pulmonary effort is normal.     Breath sounds: No stridor. No wheezing, rhonchi or rales.  Abdominal:     General: Abdomen is flat.     Palpations: There is no mass.     Tenderness: There is no abdominal tenderness. There is no guarding.     Hernia: No hernia is present.  Musculoskeletal:        General: Normal range of motion.     Cervical back: Neck supple.     Right lower leg: No edema.     Left lower leg: No edema.  Lymphadenopathy:      Cervical: No cervical adenopathy.  Skin:    General: Skin is warm and dry.  Neurological:     General: No focal deficit present.     Mental Status: She is alert. Mental status is at baseline.  Psychiatric:        Mood and Affect: Mood normal.        Behavior: Behavior normal.     Lab Results  Component Value Date   WBC 4.6 10/29/2022   HGB 9.6 (L) 10/29/2022   HCT 31.0 (L) 10/29/2022   PLT 222.0 10/29/2022   GLUCOSE 88 07/30/2022   CHOL 149 04/29/2022   TRIG 66.0 04/29/2022   HDL 73.50 04/29/2022   LDLCALC 62 04/29/2022   ALT 15 02/12/2022   AST 22 02/12/2022   NA 141 07/30/2022   K 4.3 07/30/2022   CL 102 07/30/2022   CREATININE 0.91 07/30/2022   BUN 17 07/30/2022   CO2 28 07/30/2022   TSH 1.89 07/30/2022   HGBA1C 5.8 08/14/2021    DEXAScan  Result Date: 07/02/2022 EXAM: DUAL X-RAY ABSORPTIOMETRY (DXA) FOR BONE MINERAL DENSITY IMPRESSION: Referring Physician:  Etta Grandchild Your patient completed a bone mineral density test using GE Lunar iDXA system (analysis version: 16). Technologist: sec PATIENT: Name: Jessica, Thompson Patient ID: [id number] Birth Date: [birth year] Height: 63.0 in. Sex: Female Measured: 07/02/2022 Weight: 113.0 lbs. Indications: Advanced Age, Estrogen Deficient, Postmenopausal Fractures: None Treatments: Calcium (E943.0), Vitamin D (E933.5) ASSESSMENT: The BMD measured at Femur Neck Right is 0.694 g/cm2 with a T-score of -2.5. This patient is considered osteoporotic according to World Health Organization [facility]) criteria. The quality of the exam is good. Site Region Measured Date Measured Age YA BMD Significant CHANGE T-score DualFemur Neck Right 07/02/2022 73.8 -2.5 0.694 g/cm2 * DualFemur Neck Right 07/04/2016 67.9 -2.1 0.746 g/cm2 AP Spine L1-L4 07/02/2022 73.8 0.2 1.250 g/cm2 * AP Spine L1-L4 07/04/2016 67.9 [age] 1.278 g/cm2 DualFemur Total Mean 07/02/2022 73.8 -2.1 0.739 g/cm2 * DualFemur Total Mean 07/04/2016 67.9 -1.7 0.798 g/cm2 Left Forearm Radius  33% 07/02/2022 73.8 -1.4 0.751 g/cm2 World Health Organization [facility]) criteria for post-menopausal, Caucasian Women: Normal       T-score at or above -1 SD Osteopenia   T-score between -1 and -2.5 SD Osteoporosis T-score at or below -2.5 SD RECOMMENDATION: 1. All patients should optimize calcium and vitamin D intake. 2. Consider FDA-approved medical therapies in postmenopausal women and men aged 80 years and older, based on the following: a. A hip or vertebral (clinical or morphometric) fracture. b. T-score = -2.5 at the femoral neck or spine after appropriate evaluation to exclude secondary causes. c. Low bone mass (T-score between -1.0 and -2.5  at the femoral neck or spine) and a 10-year probability of a hip fracture = 3% or a 10-year probability of a major osteoporosis-related fracture = 20% based on the [place]-adapted WHO algorithm. d. Clinician judgment and/or patient preferences may indicate treatment for people with 10-year fracture probabilities above or below these levels. FOLLOW-UP: Patients with diagnosis of osteoporosis or at high risk for fracture should have regular bone mineral density tests.? Patients eligible for Medicare are allowed routine testing every 2 years.? The testing frequency can be increased to one year for patients who have rapidly progressing disease, are receiving or discontinuing medical therapy to restore bone mass, or have additional risk factors. I have reviewed this study and agree with the findings. [facility] Electronically Signed   By: Romona Curls M.D.   On: 07/02/2022 14:04    Assessment & Plan:   Thalassemia trait- Her H&H are stable. -     CBC with Differential/Platelet; Future  DOE (dyspnea on exertion)- Labs are normal with the exception of a stable anemia.  I recommended that she undergo a coronary calcium score to gauge her risk for coronary artery disease. -     CBC with Differential/Platelet; Future -     Troponin I (High Sensitivity); Future -      Brain natriuretic peptide; Future -     CT CARDIAC SCORING (SELF PAY ONLY); Future  Primary hypertension- Her blood pressure is well-controlled. -     CT CARDIAC SCORING (SELF PAY ONLY); Future     Follow-up: Return in about 6 months (around 05/01/2023).  Sanda Linger, MD

## 2022-10-29 NOTE — Patient Instructions (Signed)
Thalassemia  Thalassemia is a blood disorder that causes a low level of red blood cells (anemia). This condition is passed from parent to child (inherited) through gene mutations. These are abnormal changes to genes. The mutations make it hard for a person's body to make the protein in red blood cells (hemoglobin) that carries oxygen from the lungs to the rest of the body. Red blood cells do not live long without hemoglobin. Loss of red blood cells leads to anemia, which is the main symptom of thalassemia. There are two main types of thalassemia. The type depends on which part of the hemoglobin is affected. Alpha thalassemia affects the alpha part of the hemoglobin. This is caused by four genes. You could get two from each parent. Beta thalassemia affects the beta part of the hemoglobin. This is caused by two genes. You could get one from each parent. Thalassemia is a lifelong condition. There is no cure, but treatment can control symptoms and manage the condition. What are the causes? Thalassemia is caused by gene mutations that are passed down through families. This condition ranges from mild to severe. People born with more gene mutations have more severe forms of the condition. A person who inherits just one gene will be a carrier of the condition (thalassemia trait). A person with thalassemia trait may not have any symptoms or may have only mild anemia. A person who inherits two or more genes can have thalassemia minor, thalassemia intermedia, or thalassemia major. What increases the risk? You are more likely to develop this condition if you have a family history of thalassemia. Also: You are at higher risk for alpha thalassemia if your ancestors are from [place], [place], the [place], or the Mediterranean region. You are at higher risk for beta thalassemia if your ancestors are from [place], [place], [place], or the Mediterranean region. What are the signs or symptoms? The most  common signs and symptoms of thalassemia are the signs and symptoms of anemia. They include: Weakness or tiredness. Pounding heartbeat. Dizziness or headache. Leg cramps. Pale skin. Feeling confused. Shortness of breath. Other symptoms may include: Skin or the white parts of your eyes turn yellow (jaundice), and dark urine. The breakdown of red blood cells can cause a yellowing pigment (bilirubin) to build up in your blood. Weak bones (osteoporosis) and bone breaks. Bones can weaken from the effort of making more hemoglobin. An enlarged spleen. This can lead to a swollen belly. Your spleen can become enlarged from filtering dead red blood cells. Frequent, severe infections. These occur if your spleen and bone marrow become weak. These organs make white blood cells that your body needs to fight infections. How is this diagnosed? Your health care provider may suspect thalassemia based on your signs and symptoms, especially if you have a family history of the condition. This condition may be diagnosed: In childhood, if you have a severe form of thalassemia. With severe forms, symptoms show early in life. At birth. In the [place] babies are screened for this condition. In adulthood, if you have thalassemia trait or thalassemia minor. These may be diagnosed if symptoms of anemia start or if a routine blood test shows unexplained anemia. Blood tests can confirm a thalassemia diagnosis. Blood tests may show: Low hemoglobin levels. Low iron levels. Abnormal hemoglobin levels. Thalassemia gene mutations. You may need to see a health care provider who specializes in blood diseases (hematologist). How is this treated? Treatment for this condition depends on the type of thalassemia  that you have. If you have thalassemia trait or thalassemia minor, you may not need treatment. But you may need treatment if you have thalassemia minor and you develop symptoms of it during an infection. If you have  thalassemia intermedia, you will have symptoms that require treatment. If you have thalassemia major, you will have serious symptoms that require regular treatment. Thalassemia treatment may include: Donated blood (transfusions) to replace red blood cells. Vitamin B (folic acid) supplements to help produce hemoglobin and red blood cells. Calcium and vitamin D supplements to help prevent osteoporosis. Medicines or injections to remove iron buildup (chelation). Iron buildup can happen in people who have transfusions often. Iron overload can damage cells of the heart, liver, and brain. If you have severe thalassemia: Your spleen may need to be removed if it becomes damaged. You may need stem cell or bone marrow transplants to transplant cells that can make red blood cells. These may be done if transfusions are not working. Follow these instructions at home: Eating and drinking  Follow instructions from your health care provider about eating or drinking restrictions. You may need to avoid foods or drinks that are high in iron or that have iron added to them (are fortified). Eat foods that are high in fiber, such as beans, whole grains, and fresh fruits and vegetables. Limit foods that are high in fat and processed sugars, such as fried or sweet foods. Good nutrition can help prevent anemia. Activity Return to your normal activities as told by your health care provider. Ask your health care provider what activities are safe for you. Exercise is needed for maintaining energy and strong bones. Ask your health care provider what amount and type of exercise is safe for you. General instructions  Take over-the-counter and prescription medicines only as told by your health care provider. Keep all routine vaccinations and flu shots up to date to reduce your risk of infection. Wash your hands often with soap and water for at least 20 seconds. If soap and water are not available, use hand sanitizer. Try to  avoid sick people, and stay out of crowds during cold and flu seasons. Do not use any products that contain nicotine or tobacco. These products include cigarettes, chewing tobacco, and vaping devices, such as e-cigarettes. If you need help quitting, ask your health care provider. Do not drink alcohol. Meet with a Dentist if you are or may become pregnant. A genetic counselor can explain the risks of passing thalassemia to a child. Keep all follow-up visits. This is important. Where to find support [employer]: thalassemia.org Where to find more information Thal[employer]: ThinCrackers.at Contact a health care provider if you have: Signs or symptoms of anemia. A fever or other signs of infection. A swollen belly. Jaundice. Summary Thalassemia is a blood disorder that causes anemia. This condition is passed down through families. Thalassemia can range from mild to severe. There is no cure, but treatment can help manage symptoms and prevent anemia. This information is not intended to replace advice given to you by your health care provider. Make sure you discuss any questions you have with your health care provider. Document Revised: 12/11/2020 Document Reviewed: 12/11/2020 Elsevier Patient Education  2023 [employer].

## 2022-11-05 ENCOUNTER — Ambulatory Visit (INDEPENDENT_AMBULATORY_CARE_PROVIDER_SITE_OTHER): Payer: Medicare HMO

## 2022-11-05 VITALS — BP 132/67 | HR 72 | Temp 96.7°F | Ht 65.5 in | Wt 115.0 lb

## 2022-11-05 DIAGNOSIS — Z0000 Encounter for general adult medical examination without abnormal findings: Secondary | ICD-10-CM | POA: Diagnosis not present

## 2022-11-05 NOTE — Patient Instructions (Signed)
Jessica Thompson , Thank you for taking time to come for your Medicare Wellness Visit. I appreciate your ongoing commitment to your health goals. Please review the following plan we discussed and let me know if I can assist you in the future.   These are the goals we discussed:  Goals      Continue to watch my cholesterol and stay active.     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     Patient Stated     Patient Stated     11/05/2022, wants to decrease medications     Prevent Falls and Broken Bones-Osteopenia     Timeframe:  Long-Range Goal Priority:  High Start Date:  01/04/2021                           Expected End Date:  07/07/2021                     Follow Up Date 04/06/2021   - always use handrails on the stairs - always wear shoes or slippers with non-slip sole - get at least 10 minutes of activity every day - keep cell phone with me always - pick up clutter from the floors - wear low heeled or flat shoes with non-skid soles    Why is this important?   When you fall, there are 3 things that control if a bone breaks or not.  These are the fall itself, how hard and the direction that you fall and how fragile your bones are.  Preventing falls is very important for you because of fragile bones.        Track and Manage My Symptoms-Asthma     Timeframe:  Long-Range Goal Priority:  High Start Date: 01/04/2021                            Expected End Date:  07/07/2021                     Follow Up Date 04/06/2021   - avoid symptom triggers outdoors - begin a symptom diary - eliminate symptom triggers at home - follow asthma action plan - keep follow-up appointments - keep rescue medicines on hand    Why is this important?   Keeping track of asthma symptoms can tell you a lot about your asthma control.  Based on symptoms and peak flow results you can see how well you are doing.  Your asthma action  plan has a green, yellow and red zone. Green means all is good; it is your goal. Yellow means your symptoms are a little worse. You will need to adjust your medications. Being in the red zone means that your   asthma is out of control. You will need to use your rescue medicines. You may need emergency care.           This is a list of the screening recommended for you and due dates:  Health Maintenance  Topic Date Due   Flu Shot  01/22/2023   Mammogram  07/17/2023   Medicare Annual Wellness Visit  11/05/2023   Colon Cancer Screening  05/08/2027   DTaP/Tdap/Td vaccine (3 - Td or Tdap) 06/08/2030   Pneumonia Vaccine  Completed   DEXA scan (bone density measurement)  Completed   Hepatitis C Screening: USPSTF Recommendation to screen - Ages 23-79 yo.  Completed   Zoster (Shingles) Vaccine  Completed   HPV Vaccine  Aged Out   COVID-19 Vaccine  Discontinued    Advanced directives: copy in chart  Conditions/risks identified: none  Next appointment: Follow up in one year for your annual wellness visit    Preventive Care 65 Years and Older, Female Preventive care refers to lifestyle choices and visits with your health care provider that can promote health and wellness. What does preventive care include? A yearly physical exam. This is also called an annual well check. Dental exams once or twice a year. Routine eye exams. Ask your health care provider how often you should have your eyes checked. Personal lifestyle choices, including: Daily care of your teeth and gums. Regular physical activity. Eating a healthy diet. Avoiding tobacco and drug use. Limiting alcohol use. Practicing safe sex. Taking low-dose aspirin every day. Taking vitamin and mineral supplements as recommended by your health care provider. What happens during an annual well check? The services and screenings done by your health care provider during your annual well check will depend on your age, overall health,  lifestyle risk factors, and family history of disease. Counseling  Your health care provider may ask you questions about your: Alcohol use. Tobacco use. Drug use. Emotional well-being. Home and relationship well-being. Sexual activity. Eating habits. History of falls. Memory and ability to understand (cognition). Work and work Astronomer. Reproductive health. Screening  You may have the following tests or measurements: Height, weight, and BMI. Blood pressure. Lipid and cholesterol levels. These may be checked every 5 years, or more frequently if you are over 57 years old. Skin check. Lung cancer screening. You may have this screening every year starting at age 41 if you have a 30-pack-year history of smoking and currently smoke or have quit within the past 15 years. Fecal occult blood test (FOBT) of the stool. You may have this test every year starting at age 37. Flexible sigmoidoscopy or colonoscopy. You may have a sigmoidoscopy every 5 years or a colonoscopy every 10 years starting at age 22. Hepatitis C blood test. Hepatitis B blood test. Sexually transmitted disease (STD) testing. Diabetes screening. This is done by checking your blood sugar (glucose) after you have not eaten for a while (fasting). You may have this done every 1-3 years. Bone density scan. This is done to screen for osteoporosis. You may have this done starting at age 51. Mammogram. This may be done every 1-2 years. Talk to your health care provider about how often you should have regular mammograms. Talk with your health care provider about your test results, treatment options, and if necessary, the need for more tests. Vaccines  Your health care provider may recommend certain vaccines, such as: Influenza vaccine. This is recommended every year. Tetanus, diphtheria, and acellular pertussis (Tdap, Td) vaccine. You may need a Td booster every 10 years. Zoster vaccine. You may need this after age  66. Pneumococcal 13-valent conjugate (PCV13) vaccine. One dose is recommended after age 24. Pneumococcal polysaccharide (PPSV23) vaccine. One dose is recommended after age 42. Talk to your health care provider about which screenings and vaccines you need and how often you need them. This information is not intended to replace advice given to you by your health care provider. Make sure you discuss any questions you have with your health care provider. Document Released: 07/06/2015 Document Revised: 02/27/2016 Document Reviewed: 04/10/2015 Elsevier Interactive  Patient Education  2017 [employer].  Fall Prevention in the [facility] can cause injuries. They can happen to people of all ages. There are many things you can do to make your home safe and to help prevent falls. What can I do on the outside of my home? Regularly fix the edges of walkways and driveways and fix any cracks. Remove anything that might make you trip as you walk through a door, such as a raised step or threshold. Trim any bushes or trees on the path to your home. Use bright outdoor lighting. Clear any walking paths of anything that might make someone trip, such as rocks or tools. Regularly check to see if handrails are loose or broken. Make sure that both sides of any steps have handrails. Any raised decks and porches should have guardrails on the edges. Have any leaves, snow, or ice cleared regularly. Use sand or salt on walking paths during winter. Clean up any spills in your garage right away. This includes oil or grease spills. What can I do in the bathroom? Use night lights. Install grab bars by the toilet and in the tub and shower. Do not use towel bars as grab bars. Use non-skid mats or decals in the tub or shower. If you need to sit down in the shower, use a plastic, non-slip stool. Keep the floor dry. Clean up any water that spills on the floor as soon as it happens. Remove soap buildup in the tub or shower  regularly. Attach bath mats securely with double-sided non-slip rug tape. Do not have throw rugs and other things on the floor that can make you trip. What can I do in the bedroom? Use night lights. Make sure that you have a light by your bed that is easy to reach. Do not use any sheets or blankets that are too big for your bed. They should not hang down onto the floor. Have a firm chair that has side arms. You can use this for support while you get dressed. Do not have throw rugs and other things on the floor that can make you trip. What can I do in the kitchen? Clean up any spills right away. Avoid walking on wet floors. Keep items that you use a lot in easy-to-reach places. If you need to reach something above you, use a strong step stool that has a grab bar. Keep electrical cords out of the way. Do not use floor polish or wax that makes floors slippery. If you must use wax, use non-skid floor wax. Do not have throw rugs and other things on the floor that can make you trip. What can I do with my stairs? Do not leave any items on the stairs. Make sure that there are handrails on both sides of the stairs and use them. Fix handrails that are broken or loose. Make sure that handrails are as long as the stairways. Check any carpeting to make sure that it is firmly attached to the stairs. Fix any carpet that is loose or worn. Avoid having throw rugs at the top or bottom of the stairs. If you do have throw rugs, attach them to the floor with carpet tape. Make sure that you have a light switch at the top of the stairs and the bottom of the stairs. If you do not have them, ask someone to add them for you. What else can I do to help prevent falls? Wear shoes that: Do not have high heels. Have  rubber bottoms. Are comfortable and fit you well. Are closed at the toe. Do not wear sandals. If you use a stepladder: Make sure that it is fully opened. Do not climb a closed stepladder. Make sure that  both sides of the stepladder are locked into place. Ask someone to hold it for you, if possible. Clearly mark and make sure that you can see: Any grab bars or handrails. First and last steps. Where the edge of each step is. Use tools that help you move around (mobility aids) if they are needed. These include: Canes. Walkers. Scooters. Crutches. Turn on the lights when you go into a dark area. Replace any light bulbs as soon as they burn out. Set up your furniture so you have a clear path. Avoid moving your furniture around. If any of your floors are uneven, fix them. If there are any pets around you, be aware of where they are. Review your medicines with your doctor. Some medicines can make you feel dizzy. This can increase your chance of falling. Ask your doctor what other things that you can do to help prevent falls. This information is not intended to replace advice given to you by your health care provider. Make sure you discuss any questions you have with your health care provider. Document Released: 04/05/2009 Document Revised: 11/15/2015 Document Reviewed: 07/14/2014 Elsevier Interactive Patient Education  2017 [employer].

## 2022-11-05 NOTE — Progress Notes (Signed)
I connected with  Harrie Foreman on 11/05/22 by a audio enabled telemedicine application and verified that I am speaking with the correct person using two identifiers.  Patient Location: Home  Provider Location: Office/Clinic  I discussed the limitations of evaluation and management by telemedicine. The patient expressed understanding and agreed to proceed.  Subjective:   Jessica Thompson is a 72 y.o. female who presents for Medicare Annual (Subsequent) preventive examination.  Patient Medicare AWV questionnaire was completed by the patient on 11/04/2022; I have confirmed that all information answered by patient is correct and no changes since this date.     Review of Systems     Cardiac Risk Factors include: advanced age (>56men, >2 women);dyslipidemia;hypertension     Objective:    Today's Vitals   11/05/22 1557  BP: 132/67  Pulse: 72  Temp: (!) 96.7 F (35.9 C)  SpO2: 97%  Weight: 115 lb (52.2 kg)  Height: 5' 5.5" (1.664 m)   Body mass index is 18.85 kg/m.     11/05/2022    4:02 PM 12/25/2021    1:13 PM 03/23/2019    1:03 PM  Advanced Directives  Does Patient Have a Medical Advance Directive? Yes No Yes  Type of Estate agent of [place];Living will  Healthcare Power of [place];Living will  Copy of Healthcare Power of Attorney in Chart? Yes - validated most recent copy scanned in chart (See row information)  No - copy requested  Would patient like information on creating a medical advance directive?  Yes (MAU/Ambulatory/Procedural Areas - Information given)     Current Medications (verified) Outpatient Encounter Medications as of 11/05/2022  Medication Sig   albuterol (PROVENTIL) (2.5 MG/3ML) 0.083% nebulizer solution Take 3 mLs (2.5 mg total) by nebulization every 6 (six) hours as needed for wheezing or shortness of breath.   budesonide (PULMICORT) 180 MCG/ACT inhaler Inhale 1 puff into the lungs every 6 (six) hours as needed  (wheezing).   CALCIUM CARB-CHOLECALCIFEROL PO Take 2 tablets by mouth daily. 650mg  / 1000 units (per tablet)   guaiFENesin (MUCINEX) 600 MG 12 hr tablet Take 600 mg by mouth daily.   indapamide (LOZOL) 1.25 MG tablet TAKE 1 TABLET BY MOUTH DAILY.   montelukast (SINGULAIR) 10 MG tablet TAKE 1 TABLET BY MOUTH EVERYDAY AT BEDTIME   Multiple Vitamins-Minerals (CENTRUM SILVER ADULT 50+ PO) Take 1 tablet by mouth daily.   revefenacin (YUPELRI) 175 MCG/3ML nebulizer solution Take 3 mLs (175 mcg total) by nebulization daily.   rosuvastatin (CRESTOR) 5 MG tablet TAKE 1 TABLET (5 MG TOTAL) BY MOUTH DAILY.   sodium chloride (OCEAN) 0.65 % SOLN nasal spray Place 2 sprays into both nostrils daily.   triamcinolone (NASACORT) 55 MCG/ACT AERO nasal inhaler Place 2 sprays into the nose daily. Reported on 06/26/2015   VENTOLIN HFA 108 (90 Base) MCG/ACT inhaler INHALE 2 PUFFS BY MOUTH EVERY 6 HOURS AS NEEDED FOR WHEEZE OR SHORTNESS OF BREATH   No facility-administered encounter medications on file as of 11/05/2022.    Allergies (verified) Cabbage, Cheese, Corn-containing products, Erythromycin, Peanut-containing drug products, Soy allergy, Wheat, Xyzal [levocetirizine], and Mushroom extract complex   History: Past Medical History:  Diagnosis Date   Allergy    Anemia    Betathalasemia minor   Asthma    Sinusitis    Past Surgical History:  Procedure Laterality Date   POLYPECTOMY     sinus sugery     Family History  Problem Relation Age of Onset   Hyperlipidemia  Mother    Anemia Mother    Hypertension Father    Emphysema Father    Heart disease Brother    Liver disease Brother    Social History   Socioeconomic History   Marital status: Widowed    Spouse name: Not on file   Number of children: Not on file   Years of education: Not on file   Highest education level: Master's degree (e.g., MA, MS, MEng, MEd, MSW, MBA)  Occupational History   Occupation: retired  Tobacco Use   Smoking status:  Never   Smokeless tobacco: Never  Vaping Use   Vaping Use: Never used  Substance and Sexual Activity   Alcohol use: No   Drug use: No   Sexual activity: Not Currently  Other Topics Concern   Not on file  Social History Narrative   Not on file   Social Determinants of Health   Financial Resource Strain: Medium Risk (11/04/2022)   Overall Financial Resource Strain (CARDIA)    Difficulty of Paying Living Expenses: Somewhat hard  Food Insecurity: No Food Insecurity (11/04/2022)   Hunger Vital Sign    Worried About Running Out of Food in the Last Year: Never true    Ran Out of Food in the Last Year: Never true  Recent Concern: Food Insecurity - Food Insecurity Present (10/25/2022)   Hunger Vital Sign    Worried About Running Out of Food in the Last Year: Sometimes true    Ran Out of Food in the Last Year: Never true  Transportation Needs: No Transportation Needs (11/04/2022)   PRAPARE - Administrator, Civil Service (Medical): No    Lack of Transportation (Non-Medical): No  Physical Activity: Insufficiently Active (11/04/2022)   Exercise Vital Sign    Days of Exercise per Week: 2 days    Minutes of Exercise per Session: 40 min  Stress: No Stress Concern Present (11/04/2022)   [employer] of Occupational Health - Occupational Stress Questionnaire    Feeling of Stress : Not at all  Social Connections: Moderately Integrated (11/04/2022)   Social Connection and Isolation Panel [NHANES]    Frequency of Communication with Friends and Family: More than three times a week    Frequency of Social Gatherings with Friends and Family: Three times a week    Attends Religious Services: More than 4 times per year    Active Member of Clubs or Organizations: Yes    Attends Banker Meetings: More than 4 times per year    Marital Status: Widowed    Tobacco Counseling Counseling given: Not Answered   Clinical Intake:  Pre-visit preparation completed: Yes  Pain :  No/denies pain     Nutritional Status: BMI of 19-24  Normal Nutritional Risks: None Diabetes: No  How often do you need to have someone help you when you read instructions, pamphlets, or other written materials from your doctor or pharmacy?: 1 - Never  Diabetic? no  Interpreter Needed?: No  Information entered by :: NAllen LPN   Activities of Daily Living    11/04/2022    5:52 PM 12/25/2021    2:00 PM  In your present state of health, do you have any difficulty performing the following activities:  Hearing? 0 0  Vision? 0 0  Difficulty concentrating or making decisions? 0 0  Walking or climbing stairs? 0 0  Dressing or bathing? 0 0  Doing errands, shopping? 0 0  Preparing Food and eating ? N N  Using the Toilet? N N  In the past six months, have you accidently leaked urine? N N  Do you have problems with loss of bowel control? N N  Managing your Medications? N N  Managing your Finances? N N  Housekeeping or managing your Housekeeping? N N    Patient Care Team: Etta Grandchild, MD as PCP - General (Internal Medicine) Szabat, Vinnie Level, [facility] (Inactive) as Pharmacist (Pharmacist) Antony Contras, MD as Consulting Physician (Ophthalmology)  Indicate any recent Medical Services you may have received from other than [facility] providers in the past year (date may be approximate).     Assessment:   This is a routine wellness examination for Craig.  Hearing/Vision screen Vision Screening - Comments:: Regular eye exams, Dr. Randon Goldsmith  Dietary issues and exercise activities discussed: Current Exercise Habits: Home exercise routine, Type of exercise: Other - see comments (gardening), Time (Minutes): 40, Frequency (Times/Week): 2, Weekly Exercise (Minutes/Week): 80   Goals Addressed             This Visit's Progress    Patient Stated       11/05/2022, wants to decrease medications       Depression Screen    11/05/2022    4:03 PM 12/25/2021   12:45 PM 05/30/2021    2:55 PM  04/11/2020    1:51 PM 03/23/2019   11:22 AM 09/02/2018   11:09 AM 02/25/2018   10:10 AM  PHQ 2/9 Scores  PHQ - 2 Score 0 0 0 0 0 0 0    Fall Risk    11/04/2022    5:52 PM 12/25/2021    1:13 PM 05/30/2021    2:55 PM 04/11/2020    1:51 PM 03/23/2019   11:20 AM  Fall Risk   Falls in the past year? 0 0 0 0 1  Number falls in past yr: 0 0   0  Injury with Fall? 0 0   0  Risk for fall due to : No Fall Risks No Fall Risks     Follow up Falls prevention discussed;Education provided;Falls evaluation completed Falls evaluation completed       FALL RISK PREVENTION PERTAINING TO THE HOME:  Any stairs in or around the home? Yes  If so, are there any without handrails? No  Home free of loose throw rugs in walkways, pet beds, electrical cords, etc? Yes  Adequate lighting in your home to reduce risk of falls? Yes   ASSISTIVE DEVICES UTILIZED TO PREVENT FALLS:  Life alert? No  Use of a cane, walker or w/c? No  Grab bars in the bathroom? Yes  Shower chair or bench in shower? No  Elevated toilet seat or a handicapped toilet? Yes   TIMED UP AND GO:  Was the test performed? No .      Cognitive Function:        11/05/2022    4:04 PM 12/25/2021    2:01 PM  6CIT Screen  What Year? 0 points 0 points  What month? 0 points 0 points  What time? 0 points 0 points  Count back from 20 0 points 0 points  Months in reverse 0 points 0 points  Repeat phrase 0 points 0 points  Total Score 0 points 0 points    Immunizations Immunization History  Administered Date(s) Administered   Influenza-Unspecified 02/21/2014, 04/10/2020, 04/13/2021   PFIZER(Purple Top)SARS-COV-2 Vaccination 08/24/2019, 09/22/2019   Pfizer Covid-19 Vaccine Bivalent Booster 5y-11y 04/13/2021   Pneumococcal Conjugate-13 05/02/2015   Pneumococcal  Polysaccharide-23 10/05/2013, 04/11/2020   Tdap 07/05/2013, 06/08/2020   Zoster Recombinat (Shingrix) 04/14/2020, 06/30/2020    TDAP status: Up to date  Flu Vaccine status: Up  to date  Pneumococcal vaccine status: Up to date  Covid-19 vaccine status: Completed vaccines  Qualifies for Shingles Vaccine? Yes   Zostavax completed No   Shingrix Completed?: Yes  Screening Tests Health Maintenance  Topic Date Due   Medicare Annual Wellness (AWV)  12/26/2022   INFLUENZA VACCINE  01/22/2023   MAMMOGRAM  07/17/2023   COLONOSCOPY (Pts 45-64yrs Insurance coverage will need to be confirmed)  05/08/2027   DTaP/Tdap/Td (3 - Td or Tdap) 06/08/2030   Pneumonia Vaccine 66+ Years old  Completed   DEXA SCAN  Completed   Hepatitis C Screening  Completed   Zoster Vaccines- Shingrix  Completed   HPV VACCINES  Aged Out   COVID-19 Vaccine  Discontinued    Health Maintenance  Health Maintenance Due  Topic Date Due   Medicare Annual Wellness (AWV)  12/26/2022    Colorectal cancer screening: No longer required.   Mammogram status: No longer required due to age.  Bone Density status: Completed 07/02/2022.  Lung Cancer Screening: (Low Dose CT Chest recommended if Age 59-80 years, 30 pack-year currently smoking OR have quit w/in 15years.) does not qualify.   Lung Cancer Screening Referral: no  Additional Screening:  Hepatitis C Screening: does qualify; Completed 06/24/2015  Vision Screening: Recommended annual ophthalmology exams for early detection of glaucoma and other disorders of the eye. Is the patient up to date with their annual eye exam?  Yes  Who is the provider or what is the name of the office in which the patient attends annual eye exams? Dr. Randon Goldsmith If pt is not established with a provider, would they like to be referred to a provider to establish care? No .   Dental Screening: Recommended annual dental exams for proper oral hygiene  Community Resource Referral / Chronic Care Management: CRR required this visit?  No   CCM required this visit?  No      Plan:     I have personally reviewed and noted the following in the patient's chart:   Medical  and social history Use of alcohol, tobacco or illicit drugs  Current medications and supplements including opioid prescriptions. Patient is not currently taking opioid prescriptions. Functional ability and status Nutritional status Physical activity Advanced directives List of other physicians Hospitalizations, surgeries, and ER visits in previous 12 months Vitals Screenings to include cognitive, depression, and falls Referrals and appointments  In addition, I have reviewed and discussed with patient certain preventive protocols, quality metrics, and best practice recommendations. A written personalized care plan for preventive services as well as general preventive health recommendations were provided to patient.     Barb Merino, LPN   1/61/0960   Nurse Notes: none  Due to this being a virtual visit, the after visit summary with patients personalized plan was offered to patient via mail or my-chart. Patient would like to access on my-chart

## 2022-11-07 ENCOUNTER — Encounter: Payer: Self-pay | Admitting: Internal Medicine

## 2022-11-21 ENCOUNTER — Other Ambulatory Visit (HOSPITAL_COMMUNITY): Payer: Medicare HMO

## 2023-01-02 DIAGNOSIS — H524 Presbyopia: Secondary | ICD-10-CM | POA: Diagnosis not present

## 2023-01-21 ENCOUNTER — Encounter (INDEPENDENT_AMBULATORY_CARE_PROVIDER_SITE_OTHER): Payer: Self-pay

## 2023-01-23 ENCOUNTER — Other Ambulatory Visit: Payer: Self-pay | Admitting: Internal Medicine

## 2023-01-23 DIAGNOSIS — E785 Hyperlipidemia, unspecified: Secondary | ICD-10-CM

## 2023-02-04 ENCOUNTER — Ambulatory Visit: Payer: Medicare HMO | Admitting: Internal Medicine

## 2023-02-11 ENCOUNTER — Ambulatory Visit (INDEPENDENT_AMBULATORY_CARE_PROVIDER_SITE_OTHER): Payer: Medicare HMO | Admitting: Internal Medicine

## 2023-02-11 ENCOUNTER — Encounter: Payer: Self-pay | Admitting: Internal Medicine

## 2023-02-11 ENCOUNTER — Ambulatory Visit: Payer: Medicare HMO | Admitting: Internal Medicine

## 2023-02-11 ENCOUNTER — Ambulatory Visit (INDEPENDENT_AMBULATORY_CARE_PROVIDER_SITE_OTHER): Payer: Medicare HMO

## 2023-02-11 VITALS — BP 148/78 | HR 76 | Temp 98.2°F | Resp 16 | Ht 65.5 in | Wt 111.0 lb

## 2023-02-11 DIAGNOSIS — J45909 Unspecified asthma, uncomplicated: Secondary | ICD-10-CM | POA: Diagnosis not present

## 2023-02-11 DIAGNOSIS — J0100 Acute maxillary sinusitis, unspecified: Secondary | ICD-10-CM | POA: Insufficient documentation

## 2023-02-11 DIAGNOSIS — J4531 Mild persistent asthma with (acute) exacerbation: Secondary | ICD-10-CM | POA: Diagnosis not present

## 2023-02-11 DIAGNOSIS — R052 Subacute cough: Secondary | ICD-10-CM | POA: Diagnosis not present

## 2023-02-11 DIAGNOSIS — J301 Allergic rhinitis due to pollen: Secondary | ICD-10-CM | POA: Diagnosis not present

## 2023-02-11 DIAGNOSIS — R059 Cough, unspecified: Secondary | ICD-10-CM | POA: Diagnosis not present

## 2023-02-11 DIAGNOSIS — J4520 Mild intermittent asthma, uncomplicated: Secondary | ICD-10-CM

## 2023-02-11 DIAGNOSIS — R0989 Other specified symptoms and signs involving the circulatory and respiratory systems: Secondary | ICD-10-CM | POA: Diagnosis not present

## 2023-02-11 MED ORDER — BUDESONIDE 180 MCG/ACT IN AEPB
1.0000 | INHALATION_SPRAY | Freq: Four times a day (QID) | RESPIRATORY_TRACT | 1 refills | Status: AC | PRN
Start: 2023-02-11 — End: ?

## 2023-02-11 MED ORDER — METHYLPREDNISOLONE ACETATE 80 MG/ML IJ SUSP
120.0000 mg | Freq: Once | INTRAMUSCULAR | Status: AC
Start: 2023-02-11 — End: 2023-02-11
  Administered 2023-02-11: 120 mg via INTRAMUSCULAR

## 2023-02-11 MED ORDER — AMOXICILLIN-POT CLAVULANATE 875-125 MG PO TABS
1.0000 | ORAL_TABLET | Freq: Two times a day (BID) | ORAL | 0 refills | Status: AC
Start: 2023-02-11 — End: 2023-02-21

## 2023-02-11 NOTE — Patient Instructions (Signed)
Asthma, Adult  Asthma is a long-term (chronic) condition that causes recurrent episodes in which the lower airways in the lungs become tight and narrow. The narrowing is caused by inflammation and tightening of the smooth muscle around the lower airways. Asthma episodes, also called asthma attacks or asthma flares, may cause coughing, making high-pitched whistling sounds when you breathe, most often when you breathe out (wheezing), shortness of breath, and chest pain. The airways may produce extra mucus caused by the inflammation and irritation. During an attack, it can be difficult to breathe. Asthma attacks can range from minor to life-threatening. Asthma cannot be cured, but medicines and lifestyle changes can help control it and treat acute attacks. It is important to keep your asthma well controlled so the condition does not interfere with your daily life. What are the causes? This condition is believed to be caused by inherited (genetic) and environmental factors, but its exact cause is not known. What can trigger an asthma attack? Many things can bring on an asthma attack or make symptoms worse. These triggers are different for every person. Common triggers include: Allergens and irritants like mold, dust, pet dander, cockroaches, pollen, air pollution, and chemical odors. Cigarette smoke. Weather changes and cold air. Stress and strong emotional responses such as crying or laughing hard. Certain medications such as aspirin or beta blockers. Infections and inflammatory conditions, such as the flu, a cold, pneumonia, or inflammation of the nasal membranes (rhinitis). Gastroesophageal reflux disease (GERD). What are the signs or symptoms? Symptoms may occur right after exposure to an asthma trigger or hours later and can vary by person. Common signs and symptoms include: Wheezing. Trouble breathing (shortness of breath). Excessive nighttime or early morning coughing. Chest  tightness. Tiredness (fatigue) with minimal activity. Difficulty talking in complete sentences. Poor exercise tolerance. How is this diagnosed? This condition is diagnosed based on: A physical exam and your medical history. Tests, which may include: Lung function studies to evaluate the flow of air in your lungs. Allergy tests. Imaging tests, such as X-rays. How is this treated? There is no cure, but symptoms can be controlled with proper treatment. Treatment usually involves: Identifying and avoiding your asthma triggers. Inhaled medicines. Two types are commonly used to treat asthma, depending on severity: Controller medicines. These help prevent asthma symptoms from occurring. They are taken every day. Fast-acting reliever or rescue medicines. These quickly relieve asthma symptoms. They are used as needed and provide short-term relief. Using other medicines, such as: Allergy medicines, such as antihistamines, if your asthma attacks are triggered by allergens. Immune medicines (immunomodulators). These are medicines that help control the immune system. Using supplemental oxygen. This is only needed during a severe episode. Creating an asthma action plan. An asthma action plan is a written plan for managing and treating your asthma attacks. This plan includes: A list of your asthma triggers and how to avoid them. Information about when medicines should be taken and when their dosage should be changed. Instructions about using a device called a peak flow meter. A peak flow meter measures how well the lungs are working and the severity of your asthma. It helps you monitor your condition. Follow these instructions at home: Take over-the-counter and prescription medicines only as told by your health care provider. Stay up to date on all vaccinations as recommended by your healthcare provider, including vaccines for the flu and pneumonia. Use a peak flow meter and keep track of your peak flow  readings. Understand and use your asthma   action plan to address any asthma flares. Do not smoke or allow anyone to smoke in your home. Contact a health care provider if: You have wheezing, shortness of breath, or a cough that is not responding to medicines. Your medicines are causing side effects, such as a rash, itching, swelling, or trouble breathing. You need to use a reliever medicine more than 2-3 times a week. Your peak flow reading is still at 50-79% of your personal best after following your action plan for 1 hour. You have a fever and shortness of breath. Get help right away if: You are getting worse and do not respond to treatment during an asthma attack. You are short of breath when at rest or when doing very little physical activity. You have difficulty eating, drinking, or talking. You have chest pain or tightness. You develop a fast heartbeat or palpitations. You have a bluish color to your lips or fingernails. You are light-headed or dizzy, or you faint. Your peak flow reading is less than 50% of your personal best. You feel too tired to breathe normally. These symptoms may be an emergency. Get help right away. Call 911. Do not wait to see if the symptoms will go away. Do not drive yourself to the hospital. Summary Asthma is a long-term (chronic) condition that causes recurrent episodes in which the airways become tight and narrow. Asthma episodes, also called asthma attacks or asthma flares, can cause coughing, wheezing, shortness of breath, and chest pain. Asthma cannot be cured, but medicines and lifestyle changes can help keep it well controlled and prevent asthma flares. Make sure you understand how to avoid triggers and how and when to use your medicines. Asthma attacks can range from minor to life-threatening. Get help right away if you have an asthma attack and do not respond to treatment with your usual rescue medicines. This information is not intended to replace  advice given to you by your health care provider. Make sure you discuss any questions you have with your health care provider. Document Revised: 03/27/2021 Document Reviewed: 03/18/2021 Elsevier Patient Education  2024 [employer]  

## 2023-02-11 NOTE — Progress Notes (Signed)
Subjective:  Patient ID: Jessica Thompson, female    DOB: 31-Jul-1950  Age: 72 y.o. MRN: [id number]  CC: Hypertension, Anemia, Sinusitis, and Asthma   HPI Jessica Thompson presents for f/up ----  Discussed the use of AI scribe software for clinical note transcription with the patient, who gave verbal consent to proceed.  History of Present Illness   The patient presents with a two-week history of unilateral nasal congestion, specifically on the right side, associated with a sensation of fluid in the ears. They report a light yellow-green nasal discharge, which they believe to be indicative of an infection. They deny any presence of blood in the phlegm. The patient also reports intermittent headaches and a slight sensation in their teeth, which they had previously discussed with their dentist who suggested it might be sinus related.  The patient notes that these symptoms began after starting Xyzal, which they believe also affected their vision, necessitating a change in eyeglass lenses. They also report a history of coughing, but it is intermittent, and they have only coughed up a small amount of phlegm. They deny any shortness of breath.  The patient has been trying to manage their symptoms with saline nasal irrigation in the mornings and evenings but feels the need to use it more frequently due to the severity of their nasal congestion. They deny any associated fever or sore throat.       Outpatient Medications Prior to Visit  Medication Sig Dispense Refill   albuterol (PROVENTIL) (2.5 MG/3ML) 0.083% nebulizer solution Take 3 mLs (2.5 mg total) by nebulization every 6 (six) hours as needed for wheezing or shortness of breath. 150 mL 5   CALCIUM CARB-CHOLECALCIFEROL PO Take 2 tablets by mouth daily. 650mg  / 1000 units (per tablet)     guaiFENesin (MUCINEX) 600 MG 12 hr tablet Take 600 mg by mouth daily.     indapamide (LOZOL) 1.25 MG tablet TAKE 1 TABLET BY MOUTH DAILY. 90 tablet 1    montelukast (SINGULAIR) 10 MG tablet TAKE 1 TABLET BY MOUTH EVERYDAY AT BEDTIME 90 tablet 1   Multiple Vitamins-Minerals (CENTRUM SILVER ADULT 50+ PO) Take 1 tablet by mouth daily.     revefenacin (YUPELRI) 175 MCG/3ML nebulizer solution Take 3 mLs (175 mcg total) by nebulization daily. 90 mL 1   rosuvastatin (CRESTOR) 5 MG tablet TAKE 1 TABLET (5 MG TOTAL) BY MOUTH DAILY. 90 tablet 1   sodium chloride (OCEAN) 0.65 % SOLN nasal spray Place 2 sprays into both nostrils daily.     triamcinolone (NASACORT) 55 MCG/ACT AERO nasal inhaler Place 2 sprays into the nose daily. Reported on 06/26/2015 50.7 mL 1   VENTOLIN HFA 108 (90 Base) MCG/ACT inhaler INHALE 2 PUFFS BY MOUTH EVERY 6 HOURS AS NEEDED FOR WHEEZE OR SHORTNESS OF BREATH 18 each 3   budesonide (PULMICORT) 180 MCG/ACT inhaler Inhale 1 puff into the lungs every 6 (six) hours as needed (wheezing). 3 each 1   No facility-administered medications prior to visit.    ROS Review of Systems  Constitutional:  Negative for appetite change, diaphoresis and unexpected weight change.  HENT:  Positive for congestion, postnasal drip, rhinorrhea, sinus pressure and sinus pain. Negative for nosebleeds, sore throat and trouble swallowing.   Respiratory:  Positive for cough and wheezing. Negative for chest tightness and shortness of breath.   Cardiovascular:  Negative for chest pain, palpitations and leg swelling.  Gastrointestinal: Negative.  Negative for abdominal pain, diarrhea, nausea and vomiting.  Genitourinary: Negative.  Negative for difficulty urinating.  Musculoskeletal: Negative.   Skin: Negative.   Hematological:  Negative for adenopathy. Does not bruise/bleed easily.  Psychiatric/Behavioral: Negative.      Objective:  BP (!) 158/70 (BP Location: Right Arm, Patient Position: Sitting, Cuff Size: Normal)   Pulse 76   Temp 98.2 F (36.8 C) (Oral)   Resp 16   Ht 5' 5.5" (1.664 m)   Wt 111 lb (50.3 kg)   SpO2 97%   BMI 18.19 kg/m   BP  Readings from Last 3 Encounters:  02/11/23 (!) 158/70  11/05/22 132/67  10/29/22 136/72    Wt Readings from Last 3 Encounters:  02/11/23 111 lb (50.3 kg)  11/05/22 115 lb (52.2 kg)  10/29/22 113 lb (51.3 kg)    Physical Exam Vitals reviewed.  HENT:     Right Ear: There is impacted cerumen.     Left Ear: There is impacted cerumen.     Nose: Mucosal edema, congestion and rhinorrhea present. No nasal tenderness. Rhinorrhea is purulent.  Eyes:     General: No scleral icterus.    Conjunctiva/sclera: Conjunctivae normal.  Cardiovascular:     Rate and Rhythm: Normal rate and regular rhythm.     Heart sounds: No murmur heard. Pulmonary:     Effort: Pulmonary effort is normal.     Breath sounds: Examination of the right-middle field reveals rhonchi and rales. Examination of the left-middle field reveals rhonchi and rales. Examination of the right-lower field reveals rhonchi and rales. Examination of the left-lower field reveals rhonchi and rales. Rhonchi and rales present. No decreased breath sounds or wheezing.  Abdominal:     General: Abdomen is flat.     Palpations: There is no mass.     Tenderness: There is no abdominal tenderness. There is no guarding.     Hernia: No hernia is present.  Musculoskeletal:        General: Normal range of motion.     Cervical back: Neck supple.     Right lower leg: No edema.     Left lower leg: No edema.  Lymphadenopathy:     Cervical: No cervical adenopathy.  Skin:    General: Skin is warm and dry.  Neurological:     General: No focal deficit present.     Mental Status: She is alert. Mental status is at baseline.  Psychiatric:        Mood and Affect: Mood normal.        Behavior: Behavior normal.     Lab Results  Component Value Date   WBC 4.6 10/29/2022   HGB 9.6 (L) 10/29/2022   HCT 31.0 (L) 10/29/2022   PLT 222.0 10/29/2022   GLUCOSE 88 07/30/2022   CHOL 149 04/29/2022   TRIG 66.0 04/29/2022   HDL 73.50 04/29/2022   LDLCALC 62  04/29/2022   ALT 15 02/12/2022   AST 22 02/12/2022   NA 141 07/30/2022   K 4.3 07/30/2022   CL 102 07/30/2022   CREATININE 0.91 07/30/2022   BUN 17 07/30/2022   CO2 28 07/30/2022   TSH 1.89 07/30/2022   HGBA1C 5.8 08/14/2021    DEXAScan  Result Date: 07/02/2022 EXAM: DUAL X-RAY ABSORPTIOMETRY (DXA) FOR BONE MINERAL DENSITY IMPRESSION: Referring Physician:  Etta Grandchild Your patient completed a bone mineral density test using GE Lunar iDXA system (analysis version: 16). Technologist: sec PATIENT: Name: Zuree, Wilkinson Patient ID: [id number] Birth Date: Jun 29, 1950 Height: 63.0 in. Sex: Female Measured: 07/02/2022 Weight: 113.0 lbs.  Indications: Advanced Age, Estrogen Deficient, Postmenopausal Fractures: None Treatments: Calcium (E943.0), Vitamin D (E933.5) ASSESSMENT: The BMD measured at Femur Neck Right is 0.694 g/cm2 with a T-score of -2.5. This patient is considered osteoporotic according to World Health Organization [facility]) criteria. The quality of the exam is good. Site Region Measured Date Measured Age YA BMD Significant CHANGE T-score DualFemur Neck Right 07/02/2022 73.8 -2.5 0.694 g/cm2 * DualFemur Neck Right 07/04/2016 67.9 -2.1 0.746 g/cm2 AP Spine L1-L4 07/02/2022 73.8 0.2 1.250 g/cm2 * AP Spine L1-L4 07/04/2016 67.9 [age] 1.278 g/cm2 DualFemur Total Mean 07/02/2022 73.8 -2.1 0.739 g/cm2 * DualFemur Total Mean 07/04/2016 67.9 -1.7 0.798 g/cm2 Left Forearm Radius 33% 07/02/2022 73.8 -1.4 0.751 g/cm2 World Health Organization [facility]) criteria for post-menopausal, Caucasian Women: Normal       T-score at or above -1 SD Osteopenia   T-score between -1 and -2.5 SD Osteoporosis T-score at or below -2.5 SD RECOMMENDATION: 1. All patients should optimize calcium and vitamin D intake. 2. Consider FDA-approved medical therapies in postmenopausal women and men aged 85 years and older, based on the following: a. A hip or vertebral (clinical or morphometric) fracture. b. T-score = -2.5 at the femoral neck  or spine after appropriate evaluation to exclude secondary causes. c. Low bone mass (T-score between -1.0 and -2.5 at the femoral neck or spine) and a 10-year probability of a hip fracture = 3% or a 10-year probability of a major osteoporosis-related fracture = 20% based on the [place]-adapted WHO algorithm. d. Clinician judgment and/or patient preferences may indicate treatment for people with 10-year fracture probabilities above or below these levels. FOLLOW-UP: Patients with diagnosis of osteoporosis or at high risk for fracture should have regular bone mineral density tests.? Patients eligible for Medicare are allowed routine testing every 2 years.? The testing frequency can be increased to one year for patients who have rapidly progressing disease, are receiving or discontinuing medical therapy to restore bone mass, or have additional risk factors. I have reviewed this study and agree with the findings. [facility] Electronically Signed   By: Romona Curls M.D.   On: 07/02/2022 14:04   DG Chest 2 View  Result Date: 02/11/2023 CLINICAL DATA:  Cough and congestion for 2-3 weeks. History of asthma. EXAM: CHEST - 2 VIEW COMPARISON:  Chest radiograph 07/24/2021 and 05/30/2021 FINDINGS: Cardiac silhouette and mediastinal contours are within normal limits. Mild atherosclerotic calcification within the aortic arch, similar to prior. There is flattening of the diaphragms and mild hyperinflation, similar to prior. The lungs are clear. No pleural effusion pneumothorax. Moderate anterior disc space narrowing and endplate osteophytosis of a lower thoracic disc level, unchanged. IMPRESSION: 1. No acute cardiopulmonary process. 2. Mild hyperinflation, similar to prior. Electronically Signed   By: Neita Garnet M.D.   On: 02/11/2023 16:15     Assessment & Plan:   Mild persistent asthma with exacerbation- Will treat with methylprednisolone.  Allergic rhinitis due to pollen, unspecified seasonality -      methylPREDNISolone Acetate  Subacute cough- Chest x-ray is negative for mass or infiltrate. -     DG Chest 2 View; Future  Acute non-recurrent maxillary sinusitis -     Amoxicillin-Pot Clavulanate; Take 1 tablet by mouth 2 (two) times daily for 10 days.  Dispense: 20 tablet; Refill: 0  Mild intermittent asthma without complication -     Budesonide; Inhale 1 puff into the lungs every 6 (six) hours as needed (wheezing).  Dispense: 3 each; Refill: 1  Follow-up: Return in about 3 months (around 05/14/2023).  Sanda Linger, MD

## 2023-02-23 ENCOUNTER — Other Ambulatory Visit: Payer: Self-pay | Admitting: Internal Medicine

## 2023-02-23 DIAGNOSIS — J4520 Mild intermittent asthma, uncomplicated: Secondary | ICD-10-CM

## 2023-03-21 ENCOUNTER — Other Ambulatory Visit: Payer: Self-pay | Admitting: Internal Medicine

## 2023-03-21 DIAGNOSIS — J4520 Mild intermittent asthma, uncomplicated: Secondary | ICD-10-CM

## 2023-04-24 ENCOUNTER — Other Ambulatory Visit: Payer: Self-pay | Admitting: Internal Medicine

## 2023-04-24 DIAGNOSIS — I10 Essential (primary) hypertension: Secondary | ICD-10-CM

## 2023-05-07 ENCOUNTER — Encounter: Payer: Self-pay | Admitting: Gastroenterology

## 2023-05-14 ENCOUNTER — Ambulatory Visit: Payer: Medicare HMO | Admitting: Internal Medicine

## 2023-05-14 DIAGNOSIS — H5211 Myopia, right eye: Secondary | ICD-10-CM | POA: Diagnosis not present

## 2023-05-14 DIAGNOSIS — H2511 Age-related nuclear cataract, right eye: Secondary | ICD-10-CM | POA: Diagnosis not present

## 2023-05-14 DIAGNOSIS — H524 Presbyopia: Secondary | ICD-10-CM | POA: Diagnosis not present

## 2023-05-14 DIAGNOSIS — H31002 Unspecified chorioretinal scars, left eye: Secondary | ICD-10-CM | POA: Diagnosis not present

## 2023-05-14 DIAGNOSIS — H25041 Posterior subcapsular polar age-related cataract, right eye: Secondary | ICD-10-CM | POA: Diagnosis not present

## 2023-05-20 ENCOUNTER — Encounter: Payer: Self-pay | Admitting: Internal Medicine

## 2023-05-20 ENCOUNTER — Ambulatory Visit (INDEPENDENT_AMBULATORY_CARE_PROVIDER_SITE_OTHER): Payer: Medicare HMO | Admitting: Internal Medicine

## 2023-05-20 VITALS — BP 138/72 | HR 75 | Temp 97.5°F | Resp 16 | Ht 65.5 in | Wt 111.8 lb

## 2023-05-20 DIAGNOSIS — E785 Hyperlipidemia, unspecified: Secondary | ICD-10-CM

## 2023-05-20 DIAGNOSIS — D563 Thalassemia minor: Secondary | ICD-10-CM

## 2023-05-20 DIAGNOSIS — I10 Essential (primary) hypertension: Secondary | ICD-10-CM

## 2023-05-20 DIAGNOSIS — J4520 Mild intermittent asthma, uncomplicated: Secondary | ICD-10-CM

## 2023-05-20 NOTE — Patient Instructions (Signed)
Asthma, Adult  Asthma is a long-term (chronic) condition that causes recurrent episodes in which the lower airways in the lungs become tight and narrow. The narrowing is caused by inflammation and tightening of the smooth muscle around the lower airways. Asthma episodes, also called asthma attacks or asthma flares, may cause coughing, making high-pitched whistling sounds when you breathe, most often when you breathe out (wheezing), shortness of breath, and chest pain. The airways may produce extra mucus caused by the inflammation and irritation. During an attack, it can be difficult to breathe. Asthma attacks can range from minor to life-threatening. Asthma cannot be cured, but medicines and lifestyle changes can help control it and treat acute attacks. It is important to keep your asthma well controlled so the condition does not interfere with your daily life. What are the causes? This condition is believed to be caused by inherited (genetic) and environmental factors, but its exact cause is not known. What can trigger an asthma attack? Many things can bring on an asthma attack or make symptoms worse. These triggers are different for every person. Common triggers include: Allergens and irritants like mold, dust, pet dander, cockroaches, pollen, air pollution, and chemical odors. Cigarette smoke. Weather changes and cold air. Stress and strong emotional responses such as crying or laughing hard. Certain medications such as aspirin or beta blockers. Infections and inflammatory conditions, such as the flu, a cold, pneumonia, or inflammation of the nasal membranes (rhinitis). Gastroesophageal reflux disease (GERD). What are the signs or symptoms? Symptoms may occur right after exposure to an asthma trigger or hours later and can vary by person. Common signs and symptoms include: Wheezing. Trouble breathing (shortness of breath). Excessive nighttime or early morning coughing. Chest  tightness. Tiredness (fatigue) with minimal activity. Difficulty talking in complete sentences. Poor exercise tolerance. How is this diagnosed? This condition is diagnosed based on: A physical exam and your medical history. Tests, which may include: Lung function studies to evaluate the flow of air in your lungs. Allergy tests. Imaging tests, such as X-rays. How is this treated? There is no cure, but symptoms can be controlled with proper treatment. Treatment usually involves: Identifying and avoiding your asthma triggers. Inhaled medicines. Two types are commonly used to treat asthma, depending on severity: Controller medicines. These help prevent asthma symptoms from occurring. They are taken every day. Fast-acting reliever or rescue medicines. These quickly relieve asthma symptoms. They are used as needed and provide short-term relief. Using other medicines, such as: Allergy medicines, such as antihistamines, if your asthma attacks are triggered by allergens. Immune medicines (immunomodulators). These are medicines that help control the immune system. Using supplemental oxygen. This is only needed during a severe episode. Creating an asthma action plan. An asthma action plan is a written plan for managing and treating your asthma attacks. This plan includes: A list of your asthma triggers and how to avoid them. Information about when medicines should be taken and when their dosage should be changed. Instructions about using a device called a peak flow meter. A peak flow meter measures how well the lungs are working and the severity of your asthma. It helps you monitor your condition. Follow these instructions at home: Take over-the-counter and prescription medicines only as told by your health care provider. Stay up to date on all vaccinations as recommended by your healthcare provider, including vaccines for the flu and pneumonia. Use a peak flow meter and keep track of your peak flow  readings. Understand and use your asthma   action plan to address any asthma flares. Do not smoke or allow anyone to smoke in your home. Contact a health care provider if: You have wheezing, shortness of breath, or a cough that is not responding to medicines. Your medicines are causing side effects, such as a rash, itching, swelling, or trouble breathing. You need to use a reliever medicine more than 2-3 times a week. Your peak flow reading is still at 50-79% of your personal best after following your action plan for 1 hour. You have a fever and shortness of breath. Get help right away if: You are getting worse and do not respond to treatment during an asthma attack. You are short of breath when at rest or when doing very little physical activity. You have difficulty eating, drinking, or talking. You have chest pain or tightness. You develop a fast heartbeat or palpitations. You have a bluish color to your lips or fingernails. You are light-headed or dizzy, or you faint. Your peak flow reading is less than 50% of your personal best. You feel too tired to breathe normally. These symptoms may be an emergency. Get help right away. Call 911. Do not wait to see if the symptoms will go away. Do not drive yourself to the hospital. Summary Asthma is a long-term (chronic) condition that causes recurrent episodes in which the airways become tight and narrow. Asthma episodes, also called asthma attacks or asthma flares, can cause coughing, wheezing, shortness of breath, and chest pain. Asthma cannot be cured, but medicines and lifestyle changes can help keep it well controlled and prevent asthma flares. Make sure you understand how to avoid triggers and how and when to use your medicines. Asthma attacks can range from minor to life-threatening. Get help right away if you have an asthma attack and do not respond to treatment with your usual rescue medicines. This information is not intended to replace  advice given to you by your health care provider. Make sure you discuss any questions you have with your health care provider. Document Revised: 03/27/2021 Document Reviewed: 03/18/2021 Elsevier Patient Education  2024 [employer]  

## 2023-05-20 NOTE — Progress Notes (Signed)
Subjective:  Patient ID: Jessica Thompson, female    DOB: 02/27/1951  Age: 72 y.o. MRN: [id number]  CC: Hypertension, Hyperlipidemia, and Anemia   HPI Jessica Thompson presents for f/up ---  Discussed the use of AI scribe software for clinical note transcription with the patient, who gave verbal consent to proceed.  History of Present Illness   The patient presents with an intermittent dry cough, which has been ongoing for an unspecified duration. She describes the cough as non-productive, with no associated hemoptysis. The patient also reports occasional nasal congestion. She has previously tried Robitussin for symptom relief, but discontinued due to concerns about potential liver damage.  In addition to the cough, the patient has been experiencing visual changes. She recently had her glasses prescription updated, but found she was unable to adjust to the new lenses. An eye examination revealed the presence of slowly progressing cataracts, which the patient is currently seeing through.  The patient has a history of hypertension, but feels that her blood pressure is well controlled. She is currently on a regimen of Ventolin for her respiratory symptoms, but has had issues with the pharmacy substituting albuterol. The patient has expressed a clear preference for Ventolin.  The patient has never had a positive mammogram and has chosen not to continue with this screening due to her age and lack of family history.  The patient's current medications include Ventolin for her respiratory symptoms. She has received a flu shot. She has no reported issues with her abdominal health and denies any swelling in her lower extremities.       Outpatient Medications Prior to Visit  Medication Sig Dispense Refill   albuterol (PROVENTIL) (2.5 MG/3ML) 0.083% nebulizer solution Take 3 mLs (2.5 mg total) by nebulization every 6 (six) hours as needed for wheezing or shortness of breath. 150 mL 5    budesonide (PULMICORT) 180 MCG/ACT inhaler Inhale 1 puff into the lungs every 6 (six) hours as needed (wheezing). 3 each 1   CALCIUM CARB-CHOLECALCIFEROL PO Take 2 tablets by mouth daily. 650mg  / 1000 units (per tablet)     guaiFENesin (MUCINEX) 600 MG 12 hr tablet Take 600 mg by mouth daily.     indapamide (LOZOL) 1.25 MG tablet TAKE 1 TABLET BY MOUTH EVERY DAY 90 tablet 1   montelukast (SINGULAIR) 10 MG tablet TAKE 1 TABLET BY MOUTH EVERYDAY AT BEDTIME 90 tablet 1   Multiple Vitamins-Minerals (CENTRUM SILVER ADULT 50+ PO) Take 1 tablet by mouth daily.     revefenacin (YUPELRI) 175 MCG/3ML nebulizer solution Take 3 mLs (175 mcg total) by nebulization daily. 90 mL 1   rosuvastatin (CRESTOR) 5 MG tablet TAKE 1 TABLET (5 MG TOTAL) BY MOUTH DAILY. 90 tablet 1   sodium chloride (OCEAN) 0.65 % SOLN nasal spray Place 2 sprays into both nostrils daily.     triamcinolone (NASACORT) 55 MCG/ACT AERO nasal inhaler Place 2 sprays into the nose daily. Reported on 06/26/2015 50.7 mL 1   VENTOLIN HFA 108 (90 Base) MCG/ACT inhaler INHALE 2 PUFFS BY MOUTH EVERY 6 HOURS AS NEEDED FOR WHEEZE OR SHORTNESS OF BREATH 18 each 3   No facility-administered medications prior to visit.    ROS Review of Systems  Constitutional:  Negative for appetite change, diaphoresis and fatigue.  HENT: Negative.  Negative for sore throat and trouble swallowing.   Respiratory:  Positive for cough. Negative for chest tightness, shortness of breath and wheezing.   Cardiovascular:  Negative for chest pain, palpitations and  leg swelling.  Gastrointestinal: Negative.  Negative for abdominal pain, constipation, diarrhea, nausea and vomiting.  Endocrine: Negative.   Genitourinary: Negative.  Negative for difficulty urinating.  Musculoskeletal: Negative.  Negative for arthralgias and myalgias.  Skin: Negative.   Neurological: Negative.  Negative for dizziness and weakness.  Hematological:  Negative for adenopathy. Does not bruise/bleed  easily.  Psychiatric/Behavioral: Negative.      Objective:  BP 138/72   Pulse 75   Temp (!) 97.5 F (36.4 C) (Oral)   Resp 16   Ht 5' 5.5" (1.664 m)   Wt 111 lb 12.8 oz (50.7 kg)   SpO2 98%   BMI 18.32 kg/m   BP Readings from Last 3 Encounters:  05/20/23 138/72  02/11/23 (!) 148/78  11/05/22 132/67    Wt Readings from Last 3 Encounters:  05/20/23 111 lb 12.8 oz (50.7 kg)  02/11/23 111 lb (50.3 kg)  11/05/22 115 lb (52.2 kg)    Physical Exam Vitals reviewed.  Constitutional:      Appearance: Normal appearance.  HENT:     Nose: Nose normal.     Mouth/Throat:     Mouth: Mucous membranes are moist.  Eyes:     General: No scleral icterus.    Conjunctiva/sclera: Conjunctivae normal.  Cardiovascular:     Rate and Rhythm: Normal rate and regular rhythm.     Heart sounds: No murmur heard. Pulmonary:     Effort: Pulmonary effort is normal.     Breath sounds: No stridor. No wheezing, rhonchi or rales.  Abdominal:     General: Abdomen is flat.     Palpations: There is no mass.     Tenderness: There is no abdominal tenderness. There is no guarding.     Hernia: No hernia is present.  Musculoskeletal:        General: Normal range of motion.     Cervical back: Neck supple.     Right lower leg: No edema.     Left lower leg: No edema.  Lymphadenopathy:     Cervical: No cervical adenopathy.  Skin:    General: Skin is warm and dry.  Neurological:     General: No focal deficit present.     Mental Status: She is alert. Mental status is at baseline.  Psychiatric:        Mood and Affect: Mood normal.        Behavior: Behavior normal.     Lab Results  Component Value Date   WBC 4.6 10/29/2022   HGB 9.6 (L) 10/29/2022   HCT 31.0 (L) 10/29/2022   PLT 222.0 10/29/2022   GLUCOSE 88 07/30/2022   CHOL 149 04/29/2022   TRIG 66.0 04/29/2022   HDL 73.50 04/29/2022   LDLCALC 62 04/29/2022   ALT 15 02/12/2022   AST 22 02/12/2022   NA 141 07/30/2022   K 4.3 07/30/2022    CL 102 07/30/2022   CREATININE 0.91 07/30/2022   BUN 17 07/30/2022   CO2 28 07/30/2022   TSH 1.89 07/30/2022   HGBA1C 5.8 08/14/2021    DEXAScan  Result Date: 07/02/2022 EXAM: DUAL X-RAY ABSORPTIOMETRY (DXA) FOR BONE MINERAL DENSITY IMPRESSION: Referring Physician:  Etta Grandchild Your patient completed a bone mineral density test using GE Lunar iDXA system (analysis version: 16). Technologist: sec PATIENT: Name: Niaya, Krimmel Patient ID: [id number] Birth Date: 05/14/1951 Height: 63.0 in. Sex: Female Measured: 07/02/2022 Weight: 113.0 lbs. Indications: Advanced Age, Estrogen Deficient, Postmenopausal Fractures: None Treatments: Calcium (E943.0), Vitamin D (E933.5)  ASSESSMENT: The BMD measured at Femur Neck Right is 0.694 g/cm2 with a T-score of -2.5. This patient is considered osteoporotic according to World Health Organization [facility]) criteria. The quality of the exam is good. Site Region Measured Date Measured Age YA BMD Significant CHANGE T-score DualFemur Neck Right 07/02/2022 73.8 -2.5 0.694 g/cm2 * DualFemur Neck Right 07/04/2016 67.9 -2.1 0.746 g/cm2 AP Spine L1-L4 07/02/2022 73.8 0.2 1.250 g/cm2 * AP Spine L1-L4 07/04/2016 67.9 0.5 1.278 g/cm2 DualFemur Total Mean 07/02/2022 73.8 -2.1 0.739 g/cm2 * DualFemur Total Mean 07/04/2016 67.9 -1.7 0.798 g/cm2 Left Forearm Radius 33% 07/02/2022 73.8 -1.4 0.751 g/cm2 World Health Organization [facility]) criteria for post-menopausal, Caucasian Women: Normal       T-score at or above -1 SD Osteopenia   T-score between -1 and -2.5 SD Osteoporosis T-score at or below -2.5 SD RECOMMENDATION: 1. All patients should optimize calcium and vitamin D intake. 2. Consider FDA-approved medical therapies in postmenopausal women and men aged 76 years and older, based on the following: a. A hip or vertebral (clinical or morphometric) fracture. b. T-score = -2.5 at the femoral neck or spine after appropriate evaluation to exclude secondary causes. c. Low bone mass (T-score  between -1.0 and -2.5 at the femoral neck or spine) and a 10-year probability of a hip fracture = 3% or a 10-year probability of a major osteoporosis-related fracture = 20% based on the [place]-adapted WHO algorithm. d. Clinician judgment and/or patient preferences may indicate treatment for people with 10-year fracture probabilities above or below these levels. FOLLOW-UP: Patients with diagnosis of osteoporosis or at high risk for fracture should have regular bone mineral density tests.? Patients eligible for Medicare are allowed routine testing every 2 years.? The testing frequency can be increased to one year for patients who have rapidly progressing disease, are receiving or discontinuing medical therapy to restore bone mass, or have additional risk factors. I have reviewed this study and agree with the findings. [facility] Electronically Signed   By: Romona Curls M.D.   On: 07/02/2022 14:04    Assessment & Plan:   Thalassemia trait - I will monitor her CBC. -     CBC with Differential/Platelet; Future  Mild intermittent asthma without complication -     CBC with Differential/Platelet; Future  Primary hypertension - Her BP is well controlled. -     Basic metabolic panel; Future  Hyperlipidemia LDL goal <100- Will monitor her lipids. -     Lipid panel; Future -     Lipoprotein A (LPA); Future -     Hepatic function panel; Future     Follow-up: Return in about 6 months (around 11/17/2023).  Sanda Linger, MD

## 2023-05-26 ENCOUNTER — Other Ambulatory Visit (INDEPENDENT_AMBULATORY_CARE_PROVIDER_SITE_OTHER): Payer: Medicare HMO

## 2023-05-26 DIAGNOSIS — E785 Hyperlipidemia, unspecified: Secondary | ICD-10-CM

## 2023-05-26 DIAGNOSIS — I10 Essential (primary) hypertension: Secondary | ICD-10-CM | POA: Diagnosis not present

## 2023-05-26 DIAGNOSIS — J4520 Mild intermittent asthma, uncomplicated: Secondary | ICD-10-CM

## 2023-05-26 DIAGNOSIS — D563 Thalassemia minor: Secondary | ICD-10-CM

## 2023-05-26 LAB — HEPATIC FUNCTION PANEL
ALT: 18 U/L (ref 0–35)
AST: 26 U/L (ref 0–37)
Albumin: 4 g/dL (ref 3.5–5.2)
Alkaline Phosphatase: 37 U/L — ABNORMAL LOW (ref 39–117)
Bilirubin, Direct: 0.1 mg/dL (ref 0.0–0.3)
Total Bilirubin: 0.5 mg/dL (ref 0.2–1.2)
Total Protein: 7.6 g/dL (ref 6.0–8.3)

## 2023-05-26 LAB — CBC WITH DIFFERENTIAL/PLATELET
Basophils Relative: 0.3 % (ref 0.0–3.0)
Eosinophils Relative: 6 % — ABNORMAL HIGH (ref 0.0–5.0)
HCT: 30.1 % — ABNORMAL LOW (ref 36.0–46.0)
Hemoglobin: 9 g/dL — ABNORMAL LOW (ref 12.0–15.0)
Lymphocytes Relative: 32 % (ref 12.0–46.0)
MCHC: 29.8 g/dL — ABNORMAL LOW (ref 30.0–36.0)
MCV: 71.9 fL — ABNORMAL LOW (ref 78.0–100.0)
Monocytes Relative: 3 % (ref 3.0–12.0)
Neutrophils Relative %: 59 % (ref 43.0–77.0)
Platelets: 200 10*3/uL (ref 150.0–400.0)
RBC: 4.18 Mil/uL (ref 3.87–5.11)
RDW: 17.8 % — ABNORMAL HIGH (ref 11.5–15.5)
WBC: 4.4 10*3/uL (ref 4.0–10.5)

## 2023-05-26 LAB — LIPID PANEL
Cholesterol: 151 mg/dL (ref 0–200)
HDL: 66.7 mg/dL (ref >39.00)
LDL Cholesterol: 73 mg/dL (ref 0–99)
NonHDL: 84.57
Total CHOL/HDL Ratio: 2
Triglycerides: 60 mg/dL (ref 0.0–149.0)
VLDL: 12 mg/dL (ref 0.0–40.0)

## 2023-05-26 LAB — BASIC METABOLIC PANEL
BUN: 20 mg/dL (ref 6–23)
CO2: 31 meq/L (ref 19–32)
Calcium: 9.3 mg/dL (ref 8.4–10.5)
Chloride: 104 meq/L (ref 96–112)
Creatinine, Ser: 0.94 mg/dL (ref 0.40–1.20)
GFR: 59.66 mL/min — ABNORMAL LOW (ref >60.00)
Glucose, Bld: 96 mg/dL (ref 70–99)
Potassium: 4 meq/L (ref 3.5–5.1)
Sodium: 139 meq/L (ref 135–145)

## 2023-06-01 LAB — LIPOPROTEIN A (LPA): Lipoprotein (a): 56 nmol/L (ref <75)

## 2023-07-22 DIAGNOSIS — Z008 Encounter for other general examination: Secondary | ICD-10-CM | POA: Diagnosis not present

## 2023-08-06 ENCOUNTER — Other Ambulatory Visit: Payer: Self-pay | Admitting: Internal Medicine

## 2023-08-06 DIAGNOSIS — E785 Hyperlipidemia, unspecified: Secondary | ICD-10-CM

## 2023-08-19 ENCOUNTER — Other Ambulatory Visit: Payer: Self-pay | Admitting: Internal Medicine

## 2023-08-19 DIAGNOSIS — J4520 Mild intermittent asthma, uncomplicated: Secondary | ICD-10-CM

## 2023-08-21 ENCOUNTER — Telehealth: Payer: Self-pay

## 2023-08-21 ENCOUNTER — Encounter: Payer: Self-pay | Admitting: Internal Medicine

## 2023-08-21 ENCOUNTER — Other Ambulatory Visit (HOSPITAL_COMMUNITY): Payer: Self-pay

## 2023-08-21 NOTE — Telephone Encounter (Signed)
 Pharmacy Patient Advocate Encounter   Received notification from Pt Calls Messages that prior authorization for Pulmicort Flexhaler is required/requested.   Insurance verification completed.   The patient is insured through [facility] .   Per test claim: PA required; PA submitted to above mentioned insurance via CoverMyMeds Key/confirmation #/EOC  [id number] Status is pending

## 2023-08-21 NOTE — Telephone Encounter (Signed)
 PA is needed for her Pulmicort Flexhaler 180 mcg inhaler. Please help.

## 2023-08-22 ENCOUNTER — Encounter: Payer: Self-pay | Admitting: Internal Medicine

## 2023-08-25 NOTE — Telephone Encounter (Signed)
 Pharmacy Patient Advocate Encounter  Received notification from [employer] that Prior Authorization for PULMICORT Linzie Collin has been DENIED.  Full denial letter will be uploaded to the media tab. See denial reason below.   PA #/Case ID/Reference #: [id number]

## 2023-08-25 NOTE — Telephone Encounter (Signed)
 FYI

## 2023-09-08 ENCOUNTER — Telehealth: Payer: Self-pay

## 2023-09-08 NOTE — Telephone Encounter (Signed)
 Called and spoke with patient regarding their osteoporosis diagnosis. Informed patient that it looked as though she had been prescribed prolia in the past but did not follow through with receiving the injection.  Patient informed me this was due to her not feeling comfortable with having the injection done until she has performed other channels of therapy. Patient currently supplementing vitamin D and calcium and will wait to follow up in May.

## 2023-09-09 ENCOUNTER — Encounter: Payer: Self-pay | Admitting: Internal Medicine

## 2023-09-09 NOTE — Telephone Encounter (Signed)
 Copied from CRM [id number]. Topic: Clinical - Prescription Issue >> Sep 09, 2023  3:52 PM Gurney Maxin H wrote: Reason for CRM: Patient states [employer] wants more information on why patient needs drug budesonide (PULMICORT) 180 MCG/ACT inhaler and not something else. Patient states office should have received denial letter because she did, not enough information was sent in to insurance company to have medication. Patient states she needs a formulary exception for medication and an appeal submitted.   Eber Jones [phone]

## 2023-09-14 NOTE — Telephone Encounter (Signed)
Could you please help us with this.

## 2023-09-21 ENCOUNTER — Telehealth: Payer: Self-pay | Admitting: Pharmacist

## 2023-09-21 NOTE — Telephone Encounter (Signed)
 I reviewed the messages regarding the appeal for the Pulmicort Flexinhaler.  Below is the rejection from the patient's insurance company:   In effort to submit the most effective appeal possible for this patient, we need sound clinical justification as to why the patient unable to try the insurances preferred alternatives listed above.  Once this information is provided, an appeal can be started.  Thank you, Dellie Burns, PharmD Clinical Pharmacist  [place]  Direct Dial: [phone]

## 2023-09-24 ENCOUNTER — Other Ambulatory Visit: Payer: Self-pay | Admitting: Pharmacist

## 2023-09-24 DIAGNOSIS — J4520 Mild intermittent asthma, uncomplicated: Secondary | ICD-10-CM

## 2023-09-24 NOTE — Telephone Encounter (Signed)
 Appeal has been completed and sent back to [employer].

## 2023-09-24 NOTE — Progress Notes (Signed)
   09/25/2023 Name: Jessica Thompson MRN: [id number] DOB: 1951/06/03  Chief Complaint  Patient presents with   Medication Access    NOVALI VOLLMAN is a 73 y.o. year old female who presented for a telephone visit.   They were referred to the pharmacist by their PCP for assistance in managing medication access.   Pt uses pulmicort first then albuterol PRN Insurance in denying Pulmicort inhaler - prefers budesonide or Alvesco.  Alvesco is still high copay $94 per test claim.  Jaz submitted appeal  for Pulmicort 09/24/23 - takes 30 days for decision  Recommend trial of AirSupra PRN (albuterol/budesonide combo). Pt knows to take Airsupra in place of Pulmicort and Albuterol inhaler while trialing.  If patient feels it works well, we can apply for AZ&Me for AirSupra. Left sample for patient and AZ&me application  Follow up plan: Wait for pt to notify me if she wants to proceed with AirSupra or not  Arbutus Leas, PharmD, BCPS, CPP Clinical Pharmacist Practitioner [facility] [phone]

## 2023-09-25 MED ORDER — AIRSUPRA 90-80 MCG/ACT IN AERO: 2.0000 | INHALATION_SPRAY | Freq: Four times a day (QID) | RESPIRATORY_TRACT | Status: DC | PRN

## 2023-09-30 ENCOUNTER — Other Ambulatory Visit (HOSPITAL_COMMUNITY): Payer: Self-pay

## 2023-10-15 ENCOUNTER — Other Ambulatory Visit: Payer: Self-pay | Admitting: Internal Medicine

## 2023-10-15 DIAGNOSIS — J4520 Mild intermittent asthma, uncomplicated: Secondary | ICD-10-CM

## 2023-11-11 ENCOUNTER — Ambulatory Visit (INDEPENDENT_AMBULATORY_CARE_PROVIDER_SITE_OTHER): Payer: Medicare HMO

## 2023-11-11 VITALS — Ht 65.5 in | Wt 113.0 lb

## 2023-11-11 DIAGNOSIS — Z0000 Encounter for general adult medical examination without abnormal findings: Secondary | ICD-10-CM | POA: Diagnosis not present

## 2023-11-11 NOTE — Patient Instructions (Addendum)
 Jessica Thompson , Thank you for taking time out of your busy schedule to complete your Annual Wellness Visit with me. I enjoyed our conversation and look forward to speaking with you again next year. I, as well as your care team,  appreciate your ongoing commitment to your health goals. Please review the following plan we discussed and let me know if I can assist you in the future. Your Game plan/ To Do List   Follow up Visits: Next Medicare AWV with our clinical staff: 11/11/2024   Have you seen your provider in the last 6 months (3 months if uncontrolled diabetes)? Yes Next Office Visit with your provider: 11/18/2023  Clinician Recommendations:  Aim for 30 minutes of exercise or brisk walking, 6-8 glasses of water, and 5 servings of fruits and vegetables each day.       This is a list of the screening recommended for you and due dates:  Health Maintenance  Topic Date Due   Flu Shot  01/22/2024   Medicare Annual Wellness Visit  11/10/2024   Colon Cancer Screening  05/08/2027   DTaP/Tdap/Td vaccine (3 - Td or Tdap) 06/08/2030   Pneumonia Vaccine  Completed   DEXA scan (bone density measurement)  Completed   Hepatitis C Screening  Completed   Zoster (Shingles) Vaccine  Completed   HPV Vaccine  Aged Out   Meningitis B Vaccine  Aged Out   COVID-19 Vaccine  Discontinued    Advanced directives: (In Chart) A copy of your advanced directives are scanned into your chart should your provider ever need it. Advance Care Planning is important because it:  [x]  Makes sure you receive the medical care that is consistent with your values, goals, and preferences  [x]  It provides guidance to your family and loved ones and reduces their decisional burden about whether or not they are making the right decisions based on your wishes.  Follow the link provided in your after visit summary or read over the paperwork we have mailed to you to help you started getting your Advance Directives in place. If you need  assistance in completing these, please reach out to us  so that we can help you!

## 2023-11-11 NOTE — Progress Notes (Signed)
 Subjective:  Please attest and cosign this visit due to patients primary care provider not being in the office at the time the visit was completed.  (Pt of Dr Sandra Crouch)   Jessica Thompson is a 73 y.o. who presents for a Medicare Wellness preventive visit.  As a reminder, Annual Wellness Visits don't include a physical exam, and some assessments may be limited, especially if this visit is performed virtually. We may recommend an in-person follow-up visit with your provider if needed.  Visit Complete: Virtual I connected with  Jessica Thompson on 11/11/23 by a audio enabled telemedicine application and verified that I am speaking with the correct person using two identifiers.  Patient Location: Home  Provider Location: Office/Clinic  I discussed the limitations of evaluation and management by telemedicine. The patient expressed understanding and agreed to proceed.  Vital Signs: Because this visit was a virtual/telehealth visit, some criteria may be missing or patient reported. Any vitals not documented were not able to be obtained and vitals that have been documented are patient reported.  VideoDeclined- This patient declined Librarian, academic. Therefore the visit was completed with audio only.  Persons Participating in Visit: Patient.  AWV Questionnaire: Yes: Patient Medicare AWV questionnaire was completed by the patient on 11/07/2023; I have confirmed that all information answered by patient is correct and no changes since this date.  Cardiac Risk Factors include: advanced age (>60men, >50 women);dyslipidemia;hypertension     Objective:     Today's Vitals   11/11/23 1549  Weight: 113 lb (51.3 kg)  Height: 5' 5.5" (1.664 m)   Body mass index is 18.52 kg/m.     11/11/2023    3:49 PM 11/05/2022    4:02 PM 12/25/2021    1:13 PM 03/23/2019    1:03 PM  Advanced Directives  Does Patient Have a Medical Advance Directive? Yes Yes No Yes  Type  of Estate agent of [place];Living will Healthcare Power of [place];Living will  Healthcare Power of [place];Living will  Does patient want to make changes to medical advance directive? No - Patient declined     Copy of Healthcare Power of Attorney in Chart? Yes - validated most recent copy scanned in chart (See row information) Yes - validated most recent copy scanned in chart (See row information)  No - copy requested  Would patient like information on creating a medical advance directive?   Yes (MAU/Ambulatory/Procedural Areas - Information given)     Current Medications (verified) Outpatient Encounter Medications as of 11/11/2023  Medication Sig   albuterol  (PROVENTIL ) (2.5 MG/3ML) 0.083% nebulizer solution Take 3 mLs (2.5 mg total) by nebulization every 6 (six) hours as needed for wheezing or shortness of breath.   budesonide  (PULMICORT ) 180 MCG/ACT inhaler Inhale 1 puff into the lungs every 6 (six) hours as needed (wheezing).   CALCIUM  CARB-CHOLECALCIFEROL PO Take 2 tablets by mouth daily. 650mg  / 1000 units (per tablet)   guaiFENesin  (MUCINEX ) 600 MG 12 hr tablet Take 600 mg by mouth daily.   indapamide  (LOZOL ) 1.25 MG tablet TAKE 1 TABLET BY MOUTH EVERY DAY   montelukast  (SINGULAIR ) 10 MG tablet TAKE 1 TABLET BY MOUTH EVERYDAY AT BEDTIME   Multiple Vitamins-Minerals (CENTRUM SILVER ADULT 50+ PO) Take 1 tablet by mouth daily.   revefenacin  (YUPELRI ) 175 MCG/3ML nebulizer solution Take 3 mLs (175 mcg total) by nebulization daily.   rosuvastatin  (CRESTOR ) 5 MG tablet TAKE 1 TABLET (5 MG TOTAL) BY MOUTH DAILY.  sodium chloride  (OCEAN) 0.65 % SOLN nasal spray Place 2 sprays into both nostrils daily.   VENTOLIN  HFA 108 (90 Base) MCG/ACT inhaler INHALE 2 PUFFS BY MOUTH EVERY 6 HOURS AS NEEDED FOR WHEEZE OR SHORTNESS OF BREATH   Albuterol -Budesonide  (AIRSUPRA ) 90-80 MCG/ACT AERO Inhale 2 puffs into the lungs every 6 (six) hours as needed.   No facility-administered  encounter medications on file as of 11/11/2023.    Allergies (verified) Cabbage, Cheese, Corn-containing products, Erythromycin, Peanut-containing drug products, Soy allergy (obsolete), Wheat, Xyzal  [levocetirizine], and Mushroom extract complex (obsolete)   History: Past Medical History:  Diagnosis Date   Allergy    Anemia    Betathalasemia minor   Asthma    Sinusitis    Past Surgical History:  Procedure Laterality Date   POLYPECTOMY     sinus sugery     Family History  Problem Relation Age of Onset   Hyperlipidemia Mother    Anemia Mother    Hypertension Father    Emphysema Father    Heart disease Brother    Liver disease Brother    Social History   Socioeconomic History   Marital status: Widowed    Spouse name: Not on file   Number of children: Not on file   Years of education: Not on file   Highest education level: Master's degree (e.g., MA, MS, MEng, MEd, MSW, MBA)  Occupational History   Occupation: retired  Tobacco Use   Smoking status: Never    Passive exposure: Never   Smokeless tobacco: Never  Vaping Use   Vaping status: Never Used  Substance and Sexual Activity   Alcohol use: No   Drug use: No   Sexual activity: Not Currently  Other Topics Concern   Not on file  Social History Narrative   Widowed   Social Drivers of Health   Financial Resource Strain: Low Risk  (11/11/2023)   Overall Financial Resource Strain (CARDIA)    Difficulty of Paying Living Expenses: Not very hard  Food Insecurity: Food Insecurity Present (11/11/2023)   Hunger Vital Sign    Worried About Running Out of Food in the Last Year: Sometimes true    Ran Out of Food in the Last Year: Never true  Transportation Needs: No Transportation Needs (11/11/2023)   PRAPARE - Administrator, Civil Service (Medical): No    Lack of Transportation (Non-Medical): No  Physical Activity: Insufficiently Active (11/11/2023)   Exercise Vital Sign    Days of Exercise per Week: 1 day     Minutes of Exercise per Session: 120 min  Stress: No Stress Concern Present (11/11/2023)   [employer] of Occupational Health - Occupational Stress Questionnaire    Feeling of Stress : Not at all  Social Connections: Moderately Integrated (11/11/2023)   Social Connection and Isolation Panel [NHANES]    Frequency of Communication with Friends and Family: More than three times a week    Frequency of Social Gatherings with Friends and Family: Once a week    Attends Religious Services: More than 4 times per year    Active Member of [employer] or Organizations: Yes    Attends Banker Meetings: More than 4 times per year    Marital Status: Widowed    Tobacco Counseling Counseling given: No    Clinical Intake:  Pre-visit preparation completed: Yes  Pain : No/denies pain     BMI - recorded: 18.52 Nutritional Status: BMI <19  Underweight Nutritional Risks: None Diabetes: No  Lab  Results  Component Value Date   HGBA1C 5.8 08/14/2021     How often do you need to have someone help you when you read instructions, pamphlets, or other written materials from your doctor or pharmacy?: 1 - Never  Interpreter Needed?: No  Information entered by :: Kandy Orris, CMA   Activities of Daily Living     11/11/2023    3:51 PM 11/07/2023    9:13 AM  In your present state of health, do you have any difficulty performing the following activities:  Hearing? 0 0  Vision? 0 0  Difficulty concentrating or making decisions? 0 0  Walking or climbing stairs? 0 0  Dressing or bathing? 0 0  Doing errands, shopping? 0 0  Preparing Food and eating ? N N  Using the Toilet? N N  In the past six months, have you accidently leaked urine? N N  Do you have problems with loss of bowel control? N N  Managing your Medications? N N  Managing your Finances? N N  Housekeeping or managing your Housekeeping? N N    Patient Care Team: Arcadio Knuckles, MD as PCP - General (Internal  Medicine) Szabat, Tino Foreman, [facility] (Inactive) as Pharmacist (Pharmacist) Alvina Axon, MD as Consulting Physician (Ophthalmology)  Indicate any recent Medical Services you may have received from other than [facility] providers in the past year (date may be approximate).     Assessment:    This is a routine wellness examination for [place].  Hearing/Vision screen Hearing Screening - Comments:: Denies hearing difficulties   Vision Screening - Comments:: Wears rx glasses - up to date with routine eye exams with Dr Terrall Ferraris   Goals Addressed               This Visit's Progress     Patient Stated (pt-stated)        Patient states she plans to continue eating healthy and exercising.       Depression Screen     11/11/2023    3:53 PM 11/05/2022    4:03 PM 12/25/2021   12:45 PM 05/30/2021    2:55 PM 04/11/2020    1:51 PM 03/23/2019   11:22 AM 09/02/2018   11:09 AM  PHQ 2/9 Scores  PHQ - 2 Score 0 0 0 0 0 0 0  PHQ- 9 Score 0          Fall Risk     11/11/2023    3:52 PM 11/07/2023    9:13 AM 05/20/2023    2:04 PM 11/04/2022    5:52 PM 12/25/2021    1:13 PM  Fall Risk   Falls in the past year? 0 0 0 0 0  Number falls in past yr: 0 0 0 0 0  Injury with Fall? 0 0 0 0 0  Risk for fall due to : No Fall Risks  No Fall Risks No Fall Risks No Fall Risks  Follow up Falls evaluation completed;Falls prevention discussed  Falls evaluation completed Falls prevention discussed;Education provided;Falls evaluation completed Falls evaluation completed    MEDICARE RISK AT HOME:  Medicare Risk at Home Any stairs in or around the home?: Yes (outside) If so, are there any without handrails?: No Home free of loose throw rugs in walkways, pet beds, electrical cords, etc?: Yes Adequate lighting in your home to reduce risk of falls?: Yes Life alert?: No Use of a cane, walker or w/c?: No Grab bars in the bathroom?: Yes Shower chair or bench in  shower?: Yes Elevated toilet seat or a handicapped toilet?:  Yes  TIMED UP AND GO:  Was the test performed?  No  Cognitive Function: 6CIT completed        11/11/2023    3:55 PM 11/05/2022    4:04 PM 12/25/2021    2:01 PM  6CIT Screen  What Year? 0 points 0 points 0 points  What month? 0 points 0 points 0 points  What time? 0 points 0 points 0 points  Count back from 20 0 points 0 points 0 points  Months in reverse 0 points 0 points 0 points  Repeat phrase 0 points 0 points 0 points  Total Score 0 points 0 points 0 points    Immunizations Immunization History  Administered Date(s) Administered   Influenza-Unspecified 02/21/2014, 04/10/2020, 04/13/2021, 04/21/2023   PFIZER(Purple Top)SARS-COV-2 Vaccination 08/24/2019, 09/22/2019   Pfizer Covid-19 Vaccine Bivalent Booster 5y-11y 04/13/2021   Pneumococcal Conjugate-13 05/02/2015   Pneumococcal Polysaccharide-23 10/05/2013, 04/11/2020   Tdap 07/05/2013, 06/08/2020   Zoster Recombinant(Shingrix ) 04/14/2020, 06/30/2020    Screening Tests Health Maintenance  Topic Date Due   INFLUENZA VACCINE  01/22/2024   Medicare Annual Wellness (AWV)  11/10/2024   Colonoscopy  05/08/2027   DTaP/Tdap/Td (3 - Td or Tdap) 06/08/2030   Pneumonia Vaccine 15+ Years old  Completed   DEXA SCAN  Completed   Hepatitis C Screening  Completed   Zoster Vaccines- Shingrix   Completed   HPV VACCINES  Aged Out   Meningococcal B Vaccine  Aged Out   COVID-19 Vaccine  Discontinued    Health Maintenance  There are no preventive care reminders to display for this patient. Health Maintenance Items Addressed: 11/11/2023   Additional Screening:  Vision Screening: Recommended annual ophthalmology exams for early detection of glaucoma and other disorders of the eye.  Dental Screening: Recommended annual dental exams for proper oral hygiene  Community Resource Referral / Chronic Care Management: CRR required this visit?  No   CCM required this visit?  No   Plan:    I have personally reviewed and noted the  following in the patient's chart:   Medical and social history Use of alcohol, tobacco or illicit drugs  Current medications and supplements including opioid prescriptions. Patient is not currently taking opioid prescriptions. Functional ability and status Nutritional status Physical activity Advanced directives List of other physicians Hospitalizations, surgeries, and ER visits in previous 12 months Vitals Screenings to include cognitive, depression, and falls Referrals and appointments  In addition, I have reviewed and discussed with patient certain preventive protocols, quality metrics, and best practice recommendations. A written personalized care plan for preventive services as well as general preventive health recommendations were provided to patient.   Patria Bookbinder, CMA   11/11/2023   After Visit Summary: (MyChart) Due to this being a telephonic visit, the after visit summary with patients personalized plan was offered to patient via MyChart   Notes: Nothing significant to report at this time.

## 2023-11-18 ENCOUNTER — Ambulatory Visit: Payer: Medicare HMO | Admitting: Internal Medicine

## 2023-11-18 ENCOUNTER — Encounter: Payer: Self-pay | Admitting: Internal Medicine

## 2023-11-18 VITALS — BP 134/80 | HR 77 | Temp 97.6°F | Ht 65.5 in | Wt 110.2 lb

## 2023-11-18 DIAGNOSIS — J320 Chronic maxillary sinusitis: Secondary | ICD-10-CM

## 2023-11-18 DIAGNOSIS — N1831 Chronic kidney disease, stage 3a: Secondary | ICD-10-CM

## 2023-11-18 DIAGNOSIS — J301 Allergic rhinitis due to pollen: Secondary | ICD-10-CM

## 2023-11-18 DIAGNOSIS — J0100 Acute maxillary sinusitis, unspecified: Secondary | ICD-10-CM | POA: Insufficient documentation

## 2023-11-18 DIAGNOSIS — J4520 Mild intermittent asthma, uncomplicated: Secondary | ICD-10-CM

## 2023-11-18 DIAGNOSIS — I10 Essential (primary) hypertension: Secondary | ICD-10-CM | POA: Diagnosis not present

## 2023-11-18 DIAGNOSIS — J340 Abscess, furuncle and carbuncle of nose: Secondary | ICD-10-CM

## 2023-11-18 DIAGNOSIS — D563 Thalassemia minor: Secondary | ICD-10-CM

## 2023-11-18 DIAGNOSIS — J339 Nasal polyp, unspecified: Secondary | ICD-10-CM | POA: Diagnosis not present

## 2023-11-18 LAB — IBC + FERRITIN
Ferritin: 47.1 ng/mL (ref 10.0–291.0)
Iron: 97 ug/dL (ref 42–145)
Saturation Ratios: 33 % (ref 20.0–50.0)
TIBC: 294 ug/dL (ref 250.0–450.0)
Transferrin: 210 mg/dL — ABNORMAL LOW (ref 212.0–360.0)

## 2023-11-18 LAB — BASIC METABOLIC PANEL WITH GFR
BUN: 19 mg/dL (ref 6–23)
CO2: 32 meq/L (ref 19–32)
Calcium: 9.8 mg/dL (ref 8.4–10.5)
Chloride: 102 meq/L (ref 96–112)
Creatinine, Ser: 1.02 mg/dL (ref 0.40–1.20)
GFR: 53.9 mL/min — ABNORMAL LOW (ref >60.00)
Glucose, Bld: 93 mg/dL (ref 70–99)
Potassium: 4.1 meq/L (ref 3.5–5.1)
Sodium: 139 meq/L (ref 135–145)

## 2023-11-18 LAB — VITAMIN B12: Vitamin B-12: 1247 pg/mL — ABNORMAL HIGH (ref 211–911)

## 2023-11-18 MED ORDER — METHYLPREDNISOLONE 4 MG PO TBPK: ORAL_TABLET | ORAL | 0 refills | Status: AC

## 2023-11-18 MED ORDER — AMOXICILLIN-POT CLAVULANATE 875-125 MG PO TABS: 1.0000 | ORAL_TABLET | Freq: Two times a day (BID) | ORAL | 0 refills | Status: AC

## 2023-11-18 NOTE — Patient Instructions (Signed)

## 2023-11-18 NOTE — Progress Notes (Unsigned)
 Subjective:  Patient ID: Jessica Thompson, female    DOB: 05-05-1951  Age: 73 y.o. MRN: [id number]  CC: Medical Management of Chronic Issues (6 month f/u. Ear and nose congestion. ), Anemia, Hypertension, and Sinusitis   HPI RICKELL WIEHE presents for f/up -----  Discussed the use of AI scribe software for clinical note transcription with the patient, who gave verbal consent to proceed.  History of Present Illness   Jessica Thompson is a 73 year old female who presents with nasal congestion and suspected nasal polyps.  She experiences persistent nasal congestion, predominantly on one side, and is unable to blow her nose effectively on that side. She suspects the regrowth of a nasal polyp, having undergone five or six previous surgeries for polyps. She uses nasal sprays but is uncertain of their efficacy.  She describes yellow nasal discharge, particularly at night, which resolves during the day. No sinus pain, epistaxis, cough, fever, chills, or night sweats. She experiences wheezing, managed with inhalants, and her nasal symptoms have been persistent since her initial clinic visit without full resolution.  Her past ear examination revealed clear ears and good hearing, though she has difficulty hearing in crowded environments. She has not had a recent sinus x-ray.  She uses inhalants twice daily to manage wheezing, especially when outdoors or engaging in physical activities, and continues to perform necessary outdoor work.       Outpatient Medications Prior to Visit  Medication Sig Dispense Refill   albuterol  (PROVENTIL ) (2.5 MG/3ML) 0.083% nebulizer solution Take 3 mLs (2.5 mg total) by nebulization every 6 (six) hours as needed for wheezing or shortness of breath. 150 mL 5   budesonide  (PULMICORT ) 180 MCG/ACT inhaler Inhale 1 puff into the lungs every 6 (six) hours as needed (wheezing). 3 each 1   CALCIUM  CARB-CHOLECALCIFEROL PO Take 2 tablets by mouth daily. 650mg  / 1000  units (per tablet)     guaiFENesin  (MUCINEX ) 600 MG 12 hr tablet Take 600 mg by mouth daily.     montelukast  (SINGULAIR ) 10 MG tablet TAKE 1 TABLET BY MOUTH EVERYDAY AT BEDTIME 90 tablet 1   Multiple Vitamins-Minerals (CENTRUM SILVER ADULT 50+ PO) Take 1 tablet by mouth daily.     revefenacin  (YUPELRI ) 175 MCG/3ML nebulizer solution Take 3 mLs (175 mcg total) by nebulization daily. 90 mL 1   rosuvastatin  (CRESTOR ) 5 MG tablet TAKE 1 TABLET (5 MG TOTAL) BY MOUTH DAILY. 90 tablet 1   sodium chloride  (OCEAN) 0.65 % SOLN nasal spray Place 2 sprays into both nostrils daily.     VENTOLIN  HFA 108 (90 Base) MCG/ACT inhaler INHALE 2 PUFFS BY MOUTH EVERY 6 HOURS AS NEEDED FOR WHEEZE OR SHORTNESS OF BREATH 18 each 3   Albuterol -Budesonide  (AIRSUPRA ) 90-80 MCG/ACT AERO Inhale 2 puffs into the lungs every 6 (six) hours as needed.     indapamide  (LOZOL ) 1.25 MG tablet TAKE 1 TABLET BY MOUTH EVERY DAY 90 tablet 1   No facility-administered medications prior to visit.    ROS Review of Systems  Objective:  BP 134/80 (BP Location: Left Arm, Patient Position: Sitting, Cuff Size: Small)   Pulse 77   Temp 97.6 F (36.4 C) (Oral)   Ht 5' 5.5" (1.664 m)   Wt 110 lb 3.2 oz (50 kg)   SpO2 99%   BMI 18.06 kg/m   BP Readings from Last 3 Encounters:  11/18/23 134/80  05/20/23 138/72  02/11/23 (!) 148/78    Wt Readings from Last 3 Encounters:  11/18/23 110 lb 3.2 oz (50 kg)  11/11/23 113 lb (51.3 kg)  05/20/23 111 lb 12.8 oz (50.7 kg)    Physical Exam HENT:     Nose: Mucosal edema, congestion and rhinorrhea present. Rhinorrhea is clear.     Right Nostril: Occlusion present. No foreign body or epistaxis.     Left Nostril: No foreign body, epistaxis or occlusion.     Right Turbinates: Enlarged, swollen and pale.     Left Turbinates: Swollen and pale. Not enlarged.     Right Sinus: No maxillary sinus tenderness or frontal sinus tenderness.     Left Sinus: No maxillary sinus tenderness or frontal  sinus tenderness.     Lab Results  Component Value Date   WBC 4.9 11/18/2023   HGB 9.4 (L) 11/18/2023   HCT 30.7 (L) 11/18/2023   PLT 208.0 11/18/2023   GLUCOSE 93 11/18/2023   CHOL 151 05/26/2023   TRIG 60.0 05/26/2023   HDL 66.70 05/26/2023   LDLCALC 73 05/26/2023   ALT 18 05/26/2023   AST 26 05/26/2023   NA 139 11/18/2023   K 4.1 11/18/2023   CL 102 11/18/2023   CREATININE 1.02 11/18/2023   BUN 19 11/18/2023   CO2 32 11/18/2023   TSH 1.89 07/30/2022   HGBA1C 5.8 08/14/2021    DEXAScan Result Date: 07/02/2022 EXAM: DUAL X-RAY ABSORPTIOMETRY (DXA) FOR BONE MINERAL DENSITY IMPRESSION: Referring Physician:  Arcadio Knuckles Your patient completed a bone mineral density test using GE Lunar iDXA system (analysis version: 16). Technologist: sec PATIENT: Name: Thompson, Jessica Patient ID: [id number] Birth Date: 11-27-1950 Height: 63.0 in. Sex: Female Measured: 07/02/2022 Weight: 113.0 lbs. Indications: Advanced Age, Estrogen Deficient, Postmenopausal Fractures: None Treatments: Calcium  (E943.0), Vitamin D  (E933.5) ASSESSMENT: The BMD measured at Femur Neck Right is 0.694 g/cm2 with a T-score of -2.5. This patient is considered osteoporotic according to World Health Organization [facility]) criteria. The quality of the exam is good. Site Region Measured Date Measured Age YA BMD Significant CHANGE T-score DualFemur Neck Right 07/02/2022 73.8 -2.5 0.694 g/cm2 * DualFemur Neck Right 07/04/2016 67.9 -2.1 0.746 g/cm2 AP Spine L1-L4 07/02/2022 73.8 0.4 1.250 g/cm2 * AP Spine L1-L4 07/04/2016 67.9 0.5 1.278 g/cm2 DualFemur Total Mean 07/02/2022 73.8 -2.1 0.739 g/cm2 * DualFemur Total Mean 07/04/2016 67.9 -1.7 0.798 g/cm2 Left Forearm Radius 33% 07/02/2022 73.8 -1.4 0.751 g/cm2 World Health Organization [facility]) criteria for post-menopausal, Caucasian Women: Normal       T-score at or above -1 SD Osteopenia   T-score between -1 and -2.5 SD Osteoporosis T-score at or below -2.5 SD RECOMMENDATION: 1. All  patients should optimize calcium  and vitamin D  intake. 2. Consider FDA-approved medical therapies in postmenopausal women and men aged 74 years and older, based on the following: a. A hip or vertebral (clinical or morphometric) fracture. b. T-score = -2.5 at the femoral neck or spine after appropriate evaluation to exclude secondary causes. c. Low bone mass (T-score between -1.0 and -2.5 at the femoral neck or spine) and a 10-year probability of a hip fracture = 3% or a 10-year probability of a major osteoporosis-related fracture = 20% based on the [place] -adapted WHO algorithm. d. Clinician judgment and/or patient preferences may indicate treatment for people with 10-year fracture probabilities above or below these levels. FOLLOW-UP: Patients with diagnosis of osteoporosis or at high risk for fracture should have regular bone mineral density tests.? Patients eligible for Medicare are allowed routine testing every 2 years.? The testing frequency can be increased to one  year for patients who have rapidly progressing disease, are receiving or discontinuing medical therapy to restore bone mass, or have additional risk factors. I have reviewed this study and agree with the findings. [facility] Electronically Signed   By: Tita Form M.D.   On: 07/02/2022 14:04    Assessment & Plan:  Allergic rhinitis due to pollen, unspecified seasonality -     methylPREDNISolone ; TAKE AS DIRECTED  Dispense: 21 tablet; Refill: 0  Nasal polyposis -     methylPREDNISolone ; TAKE AS DIRECTED  Dispense: 21 tablet; Refill: 0 -     CT MAXILLOFACIAL W CONTRAST; Future  Thalassemia trait -     CBC with Differential/Platelet; Future -     IBC + Ferritin; Future -     Vitamin B12; Future  Primary hypertension -     Basic metabolic panel with GFR; Future  Acute non-recurrent maxillary sinusitis -     Amoxicillin -Pot Clavulanate; Take 1 tablet by mouth 2 (two) times daily for 7 days.  Dispense: 14 tablet; Refill:  0 -     CT MAXILLOFACIAL W CONTRAST; Future  Mild intermittent asthma without complication  Chronic maxillary sinusitis -     CT MAXILLOFACIAL W CONTRAST; Future  Stage 3a chronic kidney disease (HCC)     Follow-up: Return in about 6 months (around 05/20/2024).  Sandra Crouch, MD

## 2023-11-19 DIAGNOSIS — N1831 Chronic kidney disease, stage 3a: Secondary | ICD-10-CM | POA: Insufficient documentation

## 2023-11-19 DIAGNOSIS — J320 Chronic maxillary sinusitis: Secondary | ICD-10-CM | POA: Insufficient documentation

## 2023-11-19 LAB — CBC WITH DIFFERENTIAL/PLATELET
Basophils Absolute: 0 10*3/uL (ref 0.0–0.1)
Basophils Relative: 0.9 % (ref 0.0–3.0)
Eosinophils Absolute: 0.4 10*3/uL (ref 0.0–0.7)
Eosinophils Relative: 7.5 % — ABNORMAL HIGH (ref 0.0–5.0)
HCT: 30.7 % — ABNORMAL LOW (ref 36.0–46.0)
Hemoglobin: 9.4 g/dL — ABNORMAL LOW (ref 12.0–15.0)
Lymphocytes Relative: 52.3 % — ABNORMAL HIGH (ref 12.0–46.0)
Lymphs Abs: 2.6 10*3/uL (ref 0.7–4.0)
MCHC: 30.4 g/dL (ref 30.0–36.0)
MCV: 70.4 fl — ABNORMAL LOW (ref 78.0–100.0)
Monocytes Absolute: 0.3 10*3/uL (ref 0.1–1.0)
Monocytes Relative: 5.7 % (ref 3.0–12.0)
Neutro Abs: 1.7 10*3/uL (ref 1.4–7.7)
Neutrophils Relative %: 33.6 % — ABNORMAL LOW (ref 43.0–77.0)
Platelets: 208 10*3/uL (ref 150.0–400.0)
RBC: 4.37 Mil/uL (ref 3.87–5.11)
RDW: 16.9 % — ABNORMAL HIGH (ref 11.5–15.5)
WBC: 4.9 10*3/uL (ref 4.0–10.5)

## 2023-11-20 ENCOUNTER — Ambulatory Visit: Payer: Self-pay | Admitting: Internal Medicine

## 2023-12-07 ENCOUNTER — Other Ambulatory Visit: Payer: Self-pay | Admitting: Internal Medicine

## 2023-12-07 DIAGNOSIS — J4520 Mild intermittent asthma, uncomplicated: Secondary | ICD-10-CM

## 2023-12-07 NOTE — Telephone Encounter (Unsigned)
 Copied from CRM [id number]. Topic: Clinical - Medication Refill >> Dec 07, 2023  3:33 PM Adrionna Y wrote: Medication: budesonide  (PULMICORT ) 180 MCG/ACT inhaler   Has the patient contacted their pharmacy? Yes (Agent: If no, request that the patient contact the pharmacy for the refill. If patient does not wish to contact the pharmacy document the reason why and proceed with request.) (Agent: If yes, when and what did the pharmacy advise?)  This is the patient's preferred pharmacy:  [id number] [place], [place] - [address] [place] [address] Phone: [phone] Fax: [phone]   Is this the correct pharmacy for this prescription? Yes If no, delete pharmacy and type the correct one.   Has the prescription been filled recently? No  Is the patient out of the medication? Yes  Has the patient been seen for an appointment in the last year OR does the patient have an upcoming appointment? Yes  Can we respond through MyChart? Yes  Agent: Please be advised that Rx refills may take up to 3 business days. We ask that you follow-up with your pharmacy.

## 2023-12-08 MED ORDER — BUDESONIDE 180 MCG/ACT IN AEPB: 1.0000 | INHALATION_SPRAY | Freq: Four times a day (QID) | RESPIRATORY_TRACT | 1 refills | Status: AC | PRN

## 2024-01-12 ENCOUNTER — Ambulatory Visit: Payer: Self-pay

## 2024-01-12 NOTE — Telephone Encounter (Signed)
 FYI Only or Action Required?: FYI only for provider.  Patient was last seen in primary care on 11/18/2023 by Joshua Debby CROME, MD.  Called Nurse Triage reporting Fire ant bite.  Symptoms began several days ago.  Interventions attempted: OTC medications: hydrocortisone, benadryl, claritin.  Symptoms are: gradually worsening.  Triage Disposition: See PCP When Office is Open (Within 3 Days)  Patient/caregiver understands and will follow disposition?: Yes  Copied from CRM [id number]. Topic: Clinical - Red Word Triage >> Jan 12, 2024  9:57 AM Jessica Thompson wrote: Red Word that prompted transfer to Nurse Triage: Bit by fire ants that's still spreading Reason for Disposition  [1] SEVERE local itching (i.e., interferes with work, school, sleep) AND [2] not improved after 24 hours of hydrocortisone cream  Answer Assessment - Initial Assessment Questions 1. SEVERITY: How many stings are there?     Severe  2. ONSET: When did it occur?      Couple days 3. LOCATION: Where is the sting located?  How many stings?     Back, spreading to shoulders 4. SWELLING: How big is the swelling? (e.g., inches or cm)     puffy 5. REDNESS: Is the area red or pink? If Yes, ask: What size is the area of redness? (e.g., inches or cm). When did the redness start?     Yes rashy 6. PAIN:  Is there any pain? If Yes, ask: How bad is it?  (Scale 0-10; or none, mild, moderate, severe)     Mild 7. ITCHING: Is there any itching? If Yes, ask: How bad is it?      intense 8. RESPIRATORY DISTRESS: Describe your breathing.     No issue 9. PRIOR REACTIONS: Have you had any severe allergic reactions to stings in the past? If Yes, ask: What happened?     Never stung before 10. OTHER SYMPTOMS: Do you have any other symptoms? (e.g., abdomen pain, face or tongue swelling, new rash elsewhere, vomiting)       denies Additional info; Using hydrocortisone cream with minimal relief, she is on daily  Claritin, took one dose of Benadryl before calling in today.  Protocols used: [employer]

## 2024-01-13 ENCOUNTER — Ambulatory Visit (INDEPENDENT_AMBULATORY_CARE_PROVIDER_SITE_OTHER): Admitting: Internal Medicine

## 2024-01-13 ENCOUNTER — Encounter: Payer: Self-pay | Admitting: Internal Medicine

## 2024-01-13 VITALS — BP 128/64 | HR 70 | Temp 97.8°F | Ht 65.5 in | Wt 108.0 lb

## 2024-01-13 DIAGNOSIS — R21 Rash and other nonspecific skin eruption: Secondary | ICD-10-CM | POA: Insufficient documentation

## 2024-01-13 DIAGNOSIS — N1831 Chronic kidney disease, stage 3a: Secondary | ICD-10-CM

## 2024-01-13 DIAGNOSIS — I10 Essential (primary) hypertension: Secondary | ICD-10-CM | POA: Diagnosis not present

## 2024-01-13 MED ORDER — METHYLPREDNISOLONE ACETATE 40 MG/ML IJ SUSP
40.0000 mg | Freq: Once | INTRAMUSCULAR | Status: AC
  Administered 2024-01-13: 40 mg via INTRAMUSCULAR

## 2024-01-13 MED ORDER — PREDNISONE 10 MG PO TABS: ORAL_TABLET | ORAL | 0 refills | Status: DC

## 2024-01-13 NOTE — Patient Instructions (Signed)
 You had the steroid shot today  Please take all new medication as prescribed- the prednisone   Also, please take OTC Pepcid 20 mg twice per day until you are better  Ok to continue the Benadryl 25 - 50 mg every 8 hrs as needed for hives and itching  Please continue all other medications as before, including the Singulair   Please have the pharmacy call with any other refills you may need.  Please keep your appointments with your specialists as you may have planned

## 2024-01-13 NOTE — Assessment & Plan Note (Signed)
 Lab Results  Component Value Date   CREATININE 1.02 11/18/2023   Stable overall, cont to avoid nephrotoxins

## 2024-01-13 NOTE — Assessment & Plan Note (Signed)
 BP Readings from Last 3 Encounters:  01/13/24 128/64  11/18/23 134/80  05/20/23 138/72   Stable, pt to continue medical treatment  - diet,wt control

## 2024-01-13 NOTE — Assessment & Plan Note (Signed)
 C/w hives, today for depomedrol 80 mg IM, prednisone  taper, pepcid 20 bid otc, and benadryl 25 - 50 mg qid prn

## 2024-01-13 NOTE — Progress Notes (Signed)
 Patient ID: Jessica Thompson, female   DOB: 05-22-1951, 73 y.o.   MRN: [id number]        Chief Complaint: follow up hives       HPI:  Jessica Thompson is a 73 y.o. female here with c/o intense itching and hive like rash to extremities and torso that come and go, but after 1 wk of prn benadryl, she presents for other tx.  No lip or other swelling.  No recent med change,  no etiology she is aware.  No prior hx of same.  Pt denies chest pain, increased sob or doe, wheezing, orthopnea, PND, increased LE swelling, palpitations, dizziness or syncope.   Pt denies polydipsia, polyuria, or new focal neuro s/s.          Wt Readings from Last 3 Encounters:  01/13/24 108 lb (49 kg)  11/18/23 110 lb 3.2 oz (50 kg)  11/11/23 113 lb (51.3 kg)   BP Readings from Last 3 Encounters:  01/13/24 128/64  11/18/23 134/80  05/20/23 138/72         Past Medical History:  Diagnosis Date   Allergy    Anemia    Betathalasemia minor   Asthma    Sinusitis    Past Surgical History:  Procedure Laterality Date   POLYPECTOMY     sinus sugery      reports that she has never smoked. She has never been exposed to tobacco smoke. She has never used smokeless tobacco. She reports that she does not drink alcohol and does not use drugs. family history includes Anemia in her mother; Emphysema in her father; Heart disease in her brother; Hyperlipidemia in her mother; Hypertension in her father; Liver disease in her brother. Allergies  Allergen Reactions   Cabbage Other (See Comments)    congestion   Cheese Other (See Comments)    congestion   Corn-Containing Products Other (See Comments)    congestion   Erythromycin Other (See Comments)    Abdominal pain   Peanut-Containing Drug Products Other (See Comments)    congestion   Soy Allergy (Obsolete) Other (See Comments)    congestion   Wheat Other (See Comments)    congestion   Xyzal  [Levocetirizine]    Mushroom Extract Complex (Obsolete) Hives, Rash and  Other (See Comments)    Congestion    Current Outpatient Medications on File Prior to Visit  Medication Sig Dispense Refill   albuterol  (PROVENTIL ) (2.5 MG/3ML) 0.083% nebulizer solution Take 3 mLs (2.5 mg total) by nebulization every 6 (six) hours as needed for wheezing or shortness of breath. 150 mL 5   budesonide  (PULMICORT ) 180 MCG/ACT inhaler Inhale 1 puff into the lungs every 6 (six) hours as needed (wheezing). 3 each 1   CALCIUM  CARB-CHOLECALCIFEROL PO Take 2 tablets by mouth daily. 650mg  / 1000 units (per tablet)     guaiFENesin  (MUCINEX ) 600 MG 12 hr tablet Take 600 mg by mouth daily.     montelukast  (SINGULAIR ) 10 MG tablet TAKE 1 TABLET BY MOUTH EVERYDAY AT BEDTIME 90 tablet 1   Multiple Vitamins-Minerals (CENTRUM SILVER ADULT 50+ PO) Take 1 tablet by mouth daily.     revefenacin  (YUPELRI ) 175 MCG/3ML nebulizer solution Take 3 mLs (175 mcg total) by nebulization daily. 90 mL 1   rosuvastatin  (CRESTOR ) 5 MG tablet TAKE 1 TABLET (5 MG TOTAL) BY MOUTH DAILY. 90 tablet 1   sodium chloride  (OCEAN) 0.65 % SOLN nasal spray Place 2 sprays into both nostrils daily.  VENTOLIN  HFA 108 (90 Base) MCG/ACT inhaler INHALE 2 PUFFS BY MOUTH EVERY 6 HOURS AS NEEDED FOR WHEEZE OR SHORTNESS OF BREATH 18 each 3   No current facility-administered medications on file prior to visit.        ROS:  All others reviewed and negative.  Objective        PE:  BP 128/64   Pulse 70   Temp 97.8 F (36.6 C) (Temporal)   Ht 5' 5.5 (1.664 m)   Wt 108 lb (49 kg)   SpO2 98%   BMI 17.70 kg/m                 Constitutional: Pt appears in NAD               HENT: Head: NCAT.                Right Ear: External ear normal.                 Left Ear: External ear normal.                Eyes: . Pupils are equal, round, and reactive to light. Conjunctivae and EOM are normal               Nose: without d/c or deformity               Neck: Neck supple. Gross normal ROM               Cardiovascular: Normal rate and  regular rhythm.                 Pulmonary/Chest: Effort normal and breath sounds without rales or wheezing.                Abd:  Soft, NT, ND, + BS, no organomegaly               Neurological: Pt is alert. At baseline orientation, motor grossly intact               Skin: Skin is warm. LE edema - none, diffuse hive like rash to torso and proximal extremities               Psychiatric: Pt behavior is normal without agitation   Micro: none  Cardiac tracings I have personally interpreted today:  none  Pertinent Radiological findings (summarize): none   Lab Results  Component Value Date   WBC 4.9 11/18/2023   HGB 9.4 (L) 11/18/2023   HCT 30.7 (L) 11/18/2023   PLT 208.0 11/18/2023   GLUCOSE 93 11/18/2023   CHOL 151 05/26/2023   TRIG 60.0 05/26/2023   HDL 66.70 05/26/2023   LDLCALC 73 05/26/2023   ALT 18 05/26/2023   AST 26 05/26/2023   NA 139 11/18/2023   K 4.1 11/18/2023   CL 102 11/18/2023   CREATININE 1.02 11/18/2023   BUN 19 11/18/2023   CO2 32 11/18/2023   TSH 1.89 07/30/2022   HGBA1C 5.8 08/14/2021   Assessment/Plan:  [name] is a 73 y.o. Black or African American [2] female with  has a past medical history of Allergy, Anemia, Asthma, and Sinusitis.  Rash C/w hives, today for depomedrol 80 mg IM, prednisone  taper, pepcid 20 bid otc, and benadryl 25 - 50 mg qid prn  Stage 3a chronic kidney disease (HCC) Lab Results  Component Value Date   CREATININE 1.02 11/18/2023   Stable overall, cont to avoid nephrotoxins  Primary hypertension BP Readings from Last 3 Encounters:  01/13/24 128/64  11/18/23 134/80  05/20/23 138/72   Stable, pt to continue medical treatment  - diet,wt control  Followup: Return if symptoms worsen or fail to improve.  Lynwood Rush, MD 01/13/2024 7:37 PM [facility]

## 2024-01-22 ENCOUNTER — Other Ambulatory Visit: Payer: Self-pay | Admitting: Internal Medicine

## 2024-01-22 DIAGNOSIS — E785 Hyperlipidemia, unspecified: Secondary | ICD-10-CM

## 2024-01-25 NOTE — Telephone Encounter (Signed)
 Last OV 01/13/24 Next OV 05/26/24  Last refill 08/06/23 Qty #90/1   Allergy warning, forwarding to Dr. Joshua,

## 2024-02-18 ENCOUNTER — Other Ambulatory Visit: Payer: Self-pay | Admitting: Internal Medicine

## 2024-02-18 DIAGNOSIS — J4520 Mild intermittent asthma, uncomplicated: Secondary | ICD-10-CM

## 2024-04-20 ENCOUNTER — Other Ambulatory Visit: Payer: Self-pay | Admitting: Internal Medicine

## 2024-04-20 DIAGNOSIS — J418 Mixed simple and mucopurulent chronic bronchitis: Secondary | ICD-10-CM

## 2024-04-20 DIAGNOSIS — J4520 Mild intermittent asthma, uncomplicated: Secondary | ICD-10-CM

## 2024-05-25 ENCOUNTER — Ambulatory Visit: Admitting: Internal Medicine

## 2024-05-25 DIAGNOSIS — H5211 Myopia, right eye: Secondary | ICD-10-CM | POA: Diagnosis not present

## 2024-05-25 DIAGNOSIS — H31002 Unspecified chorioretinal scars, left eye: Secondary | ICD-10-CM | POA: Diagnosis not present

## 2024-05-25 DIAGNOSIS — H25041 Posterior subcapsular polar age-related cataract, right eye: Secondary | ICD-10-CM | POA: Diagnosis not present

## 2024-05-25 DIAGNOSIS — H2511 Age-related nuclear cataract, right eye: Secondary | ICD-10-CM | POA: Diagnosis not present

## 2024-05-26 ENCOUNTER — Encounter: Payer: Self-pay | Admitting: Internal Medicine

## 2024-05-26 ENCOUNTER — Ambulatory Visit: Admitting: Internal Medicine

## 2024-05-26 ENCOUNTER — Ambulatory Visit: Payer: Self-pay | Admitting: Internal Medicine

## 2024-05-26 VITALS — BP 144/92 | HR 60 | Temp 97.6°F | Resp 16 | Ht 65.5 in | Wt 113.8 lb

## 2024-05-26 DIAGNOSIS — J339 Nasal polyp, unspecified: Secondary | ICD-10-CM

## 2024-05-26 DIAGNOSIS — J301 Allergic rhinitis due to pollen: Secondary | ICD-10-CM | POA: Diagnosis not present

## 2024-05-26 DIAGNOSIS — I10 Essential (primary) hypertension: Secondary | ICD-10-CM

## 2024-05-26 DIAGNOSIS — M810 Age-related osteoporosis without current pathological fracture: Secondary | ICD-10-CM | POA: Diagnosis not present

## 2024-05-26 DIAGNOSIS — Z0000 Encounter for general adult medical examination without abnormal findings: Secondary | ICD-10-CM | POA: Diagnosis not present

## 2024-05-26 DIAGNOSIS — N1831 Chronic kidney disease, stage 3a: Secondary | ICD-10-CM | POA: Diagnosis not present

## 2024-05-26 DIAGNOSIS — E785 Hyperlipidemia, unspecified: Secondary | ICD-10-CM | POA: Diagnosis not present

## 2024-05-26 DIAGNOSIS — J0100 Acute maxillary sinusitis, unspecified: Secondary | ICD-10-CM | POA: Diagnosis not present

## 2024-05-26 DIAGNOSIS — Z0001 Encounter for general adult medical examination with abnormal findings: Secondary | ICD-10-CM

## 2024-05-26 LAB — CBC WITH DIFFERENTIAL/PLATELET
Basophils Relative: 0 % (ref 0.0–3.0)
Eosinophils Relative: 9 % — ABNORMAL HIGH (ref 0.0–5.0)
HCT: 31.9 % — ABNORMAL LOW (ref 36.0–46.0)
Hemoglobin: 9.7 g/dL — ABNORMAL LOW (ref 12.0–15.0)
Lymphocytes Relative: 44 % (ref 12.0–46.0)
MCHC: 30.3 g/dL (ref 30.0–36.0)
MCV: 70.3 fl — ABNORMAL LOW (ref 78.0–100.0)
Monocytes Relative: 4 % (ref 3.0–12.0)
Neutrophils Relative %: 43 % (ref 43.0–77.0)
Platelets: 213 K/uL (ref 150.0–400.0)
RBC: 4.53 Mil/uL (ref 3.87–5.11)
RDW: 17.7 % — ABNORMAL HIGH (ref 11.5–15.5)
WBC: 5.2 K/uL (ref 4.0–10.5)

## 2024-05-26 LAB — HEPATIC FUNCTION PANEL
ALT: 21 U/L (ref 0–35)
AST: 31 U/L (ref 0–37)
Albumin: 4.6 g/dL (ref 3.5–5.2)
Alkaline Phosphatase: 38 U/L — ABNORMAL LOW (ref 39–117)
Bilirubin, Direct: 0 mg/dL (ref 0.0–0.3)
Total Bilirubin: 0.5 mg/dL (ref 0.2–1.2)
Total Protein: 8.2 g/dL (ref 6.0–8.3)

## 2024-05-26 LAB — URINALYSIS, ROUTINE W REFLEX MICROSCOPIC
Bilirubin Urine: NEGATIVE
Hgb urine dipstick: NEGATIVE
Ketones, ur: NEGATIVE
Nitrite: NEGATIVE
Specific Gravity, Urine: 1.01 (ref 1.000–1.030)
Total Protein, Urine: NEGATIVE
Urine Glucose: NEGATIVE
Urobilinogen, UA: 0.2 (ref 0.0–1.0)
pH: 7 (ref 5.0–8.0)

## 2024-05-26 LAB — LIPID PANEL
Cholesterol: 153 mg/dL (ref 0–200)
HDL: 78.2 mg/dL (ref >39.00)
LDL Cholesterol: 65 mg/dL (ref 0–99)
NonHDL: 74.79
Total CHOL/HDL Ratio: 2
Triglycerides: 49 mg/dL (ref 0.0–149.0)
VLDL: 9.8 mg/dL (ref 0.0–40.0)

## 2024-05-26 LAB — BASIC METABOLIC PANEL WITH GFR
BUN: 17 mg/dL (ref 6–23)
CO2: 29 meq/L (ref 19–32)
Calcium: 10 mg/dL (ref 8.4–10.5)
Chloride: 102 meq/L (ref 96–112)
Creatinine, Ser: 0.84 mg/dL (ref 0.40–1.20)
GFR: 67.8 mL/min (ref >60.00)
Glucose, Bld: 88 mg/dL (ref 70–99)
Potassium: 4.6 meq/L (ref 3.5–5.1)
Sodium: 138 meq/L (ref 135–145)

## 2024-05-26 LAB — PHOSPHORUS: Phosphorus: 3.9 mg/dL (ref 2.3–4.6)

## 2024-05-26 LAB — VITAMIN D 25 HYDROXY (VIT D DEFICIENCY, FRACTURES): VITD: 59.1 ng/mL (ref 30.00–100.00)

## 2024-05-26 LAB — TSH: TSH: 2.86 u[IU]/mL (ref 0.35–5.50)

## 2024-05-26 MED ORDER — XHANCE 93 MCG/ACT NA EXHU: 2.0000 | INHALANT_SUSPENSION | Freq: Two times a day (BID) | NASAL | 1 refills | Status: AC

## 2024-05-26 MED ORDER — DENOSUMAB 60 MG/ML ~~LOC~~ SOSY: 60.0000 mg | PREFILLED_SYRINGE | Freq: Once | SUBCUTANEOUS | Status: AC

## 2024-05-26 MED ORDER — METHYLPREDNISOLONE 4 MG PO TBPK: ORAL_TABLET | ORAL | 0 refills | Status: AC

## 2024-05-26 MED ORDER — CEFPODOXIME PROXETIL 200 MG PO TABS: 200.0000 mg | ORAL_TABLET | Freq: Two times a day (BID) | ORAL | 0 refills | Status: AC

## 2024-05-26 MED ORDER — DENOSUMAB 60 MG/ML ~~LOC~~ SOSY: 60.0000 mg | PREFILLED_SYRINGE | Freq: Once | SUBCUTANEOUS | Status: DC

## 2024-05-26 MED ORDER — FOSTEUM PLUS PO CAPS: 1.0000 | ORAL_CAPSULE | Freq: Two times a day (BID) | ORAL | 5 refills | Status: AC

## 2024-05-26 NOTE — Patient Instructions (Signed)

## 2024-05-26 NOTE — Progress Notes (Unsigned)
 Subjective:  Patient ID: Jessica Thompson, female    DOB: 03/21/1951  Age: 73 y.o. MRN: [id number]  CC: Hypertension, Chronic Kidney Disease (3a), Annual Exam, Anemia, Sinusitis, Allergic Rhinitis , and Hyperlipidemia   HPI [name] presents for f/up ----  Discussed the use of AI scribe software for clinical note transcription with the patient, who gave verbal consent to proceed.  History of Present Illness Jessica Thompson is a 73 year old female who presents with nasal congestion and suspected sinus infection.  She has been experiencing nasal congestion for the past two weeks, primarily affecting one side of her nose and sometimes extending to her ears. She notes yellow nasal discharge. No lung-related breathing difficulties, sore throat, or sinus pain.  She is not taking prednisone  at the moment but continues to use Singulair  (montelukast ) and a Pulmicort  inhaler. She occasionally experiences wheezing. No fever, chills, or coughing.  She has a recent diagnosis of stage 3A chronic kidney disease and is concerned about the potential impact of her medications on her kidney function.     Outpatient Medications Prior to Visit  Medication Sig Dispense Refill   albuterol  (PROVENTIL ) (2.5 MG/3ML) 0.083% nebulizer solution INHALE 3 ML BY NEBULIZATION EVERY 6 HOURS AS NEEDED FOR WHEEZING OR SHORTNESS OF BREATH 150 mL 5   budesonide  (PULMICORT ) 180 MCG/ACT inhaler Inhale 1 puff into the lungs every 6 (six) hours as needed (wheezing). 3 each 1   CALCIUM  CARB-CHOLECALCIFEROL PO Take 2 tablets by mouth daily. 650mg  / 1000 units (per tablet)     guaiFENesin  (MUCINEX ) 600 MG 12 hr tablet Take 600 mg by mouth daily.     montelukast  (SINGULAIR ) 10 MG tablet TAKE 1 TABLET BY MOUTH EVERYDAY AT BEDTIME 90 tablet 1   Multiple Vitamins-Minerals (CENTRUM SILVER ADULT 50+ PO) Take 1 tablet by mouth daily.     revefenacin  (YUPELRI ) 175 MCG/3ML nebulizer solution Take 3 mLs (175 mcg total)  by nebulization daily. 90 mL 1   rosuvastatin  (CRESTOR ) 5 MG tablet TAKE 1 TABLET (5 MG TOTAL) BY MOUTH DAILY. 90 tablet 1   sodium chloride  (OCEAN) 0.65 % SOLN nasal spray Place 2 sprays into both nostrils daily.     VENTOLIN  HFA 108 (90 Base) MCG/ACT inhaler INHALE 2 PUFFS BY MOUTH EVERY 6 HOURS AS NEEDED FOR WHEEZE OR SHORTNESS OF BREATH 18 each 3   predniSONE  (DELTASONE ) 10 MG tablet 3 tabs by mouth per day for 3 days,2tabs per day for 3 days,1tab per day for 3 days 18 tablet 0   No facility-administered medications prior to visit.    ROS Review of Systems  Constitutional: Negative.  Negative for appetite change, chills, diaphoresis, fatigue and fever.  HENT:  Positive for congestion, postnasal drip, rhinorrhea, sinus pressure and sinus pain. Negative for ear pain, facial swelling, nosebleeds, sore throat and trouble swallowing.   Eyes: Negative.  Negative for visual disturbance.  Respiratory: Negative.  Negative for choking, shortness of breath and wheezing.   Cardiovascular:  Negative for chest pain, palpitations and leg swelling.  Gastrointestinal: Negative.  Negative for abdominal pain, constipation, diarrhea, nausea and vomiting.  Endocrine: Negative.   Genitourinary: Negative.  Negative for difficulty urinating and dysuria.  Musculoskeletal: Negative.  Negative for arthralgias and myalgias.  Skin: Negative.   Neurological:  Negative for dizziness, weakness, light-headedness and headaches.  Hematological:  Negative for adenopathy. Does not bruise/bleed easily.  Psychiatric/Behavioral: Negative.      Objective:  BP 138/88 (BP Location: Left Arm, Patient Position:  Sitting, Cuff Size: Normal)   Pulse 60   Temp 97.6 F (36.4 C) (Oral)   Resp 16   Ht 5' 5.5 (1.664 m)   Wt 113 lb 12.8 oz (51.6 kg)   SpO2 99%   BMI 18.65 kg/m   BP Readings from Last 3 Encounters:  05/26/24 138/88  01/13/24 128/64  11/18/23 134/80    Wt Readings from Last 3 Encounters:  05/26/24 113 lb  12.8 oz (51.6 kg)  01/13/24 108 lb (49 kg)  11/18/23 110 lb 3.2 oz (50 kg)    Physical Exam Vitals reviewed.  Constitutional:      Appearance: Normal appearance.  HENT:     Nose: Mucosal edema, congestion and rhinorrhea present. Rhinorrhea is purulent.     Right Nostril: No foreign body, epistaxis, septal hematoma or occlusion.     Left Nostril: No foreign body, epistaxis, septal hematoma or occlusion.     Right Turbinates: Swollen and pale. Not enlarged.     Left Turbinates: Swollen and pale. Not enlarged.     Right Sinus: No maxillary sinus tenderness or frontal sinus tenderness.     Left Sinus: No maxillary sinus tenderness or frontal sinus tenderness.     Mouth/Throat:     Mouth: Mucous membranes are moist.  Eyes:     General: No scleral icterus.    Conjunctiva/sclera: Conjunctivae normal.  Cardiovascular:     Rate and Rhythm: Normal rate and regular rhythm. Frequent Extrasystoles are present.    Heart sounds: Normal heart sounds, S1 normal and S2 normal. No murmur heard.    No friction rub. No gallop.     Comments: EKG---- SR with frequent PVC's (new), 66 bpm NS T wave changes No LVH or Q waves  Pulmonary:     Effort: Pulmonary effort is normal.     Breath sounds: No stridor. No wheezing, rhonchi or rales.  Abdominal:     General: Abdomen is flat.     Palpations: There is no mass.     Tenderness: There is no abdominal tenderness. There is no guarding.     Hernia: No hernia is present.  Musculoskeletal:        General: No swelling.     Cervical back: Neck supple.     Right lower leg: No edema.     Left lower leg: No edema.  Lymphadenopathy:     Cervical: No cervical adenopathy.  Skin:    General: Skin is warm and dry.     Coloration: Skin is not pale.  Neurological:     General: No focal deficit present.     Mental Status: She is alert. Mental status is at baseline.  Psychiatric:        Mood and Affect: Mood normal.        Behavior: Behavior normal.      Lab Results  Component Value Date   WBC 5.2 05/26/2024   HGB 9.7 (L) 05/26/2024   HCT 31.9 (L) 05/26/2024   PLT 213.0 05/26/2024   GLUCOSE 88 05/26/2024   CHOL 153 05/26/2024   TRIG 49.0 05/26/2024   HDL 78.20 05/26/2024   LDLCALC 65 05/26/2024   ALT 21 05/26/2024   AST 31 05/26/2024   NA 138 05/26/2024   K 4.6 05/26/2024   CL 102 05/26/2024   CREATININE 0.84 05/26/2024   BUN 17 05/26/2024   CO2 29 05/26/2024   TSH 2.86 05/26/2024   HGBA1C 5.8 08/14/2021    DEXAScan Result Date: 07/02/2022 EXAM: DUAL X-RAY  ABSORPTIOMETRY (DXA) FOR BONE MINERAL DENSITY IMPRESSION: Referring Physician:  DEBBY LITTIE MOLT Your patient completed a bone mineral density test using GE Lunar iDXA system (analysis version: 16). Technologist: sec PATIENT: Name: Roiza, Wiedel Patient ID: [id number] Birth Date: 1950/11/20 Height: 63.0 in. Sex: Female Measured: 07/02/2022 Weight: 113.0 lbs. Indications: Advanced Age, Estrogen Deficient, Postmenopausal Fractures: None Treatments: Calcium  (E943.0), Vitamin D  (E933.5) ASSESSMENT: The BMD measured at Femur Neck Right is 0.694 g/cm2 with a T-score of -2.5. This patient is considered osteoporotic according to World Health Organization [facility]) criteria. The quality of the exam is good. Site Region Measured Date Measured Age YA BMD Significant CHANGE T-score DualFemur Neck Right 07/02/2022 73.8 -2.5 0.694 g/cm2 * DualFemur Neck Right 07/04/2016 67.9 -2.1 0.746 g/cm2 AP Spine L1-L4 07/02/2022 73.8 0.4 1.250 g/cm2 * AP Spine L1-L4 07/04/2016 67.9 0.5 1.278 g/cm2 DualFemur Total Mean 07/02/2022 73.8 -2.1 0.739 g/cm2 * DualFemur Total Mean 07/04/2016 67.9 -1.7 0.798 g/cm2 Left Forearm Radius 33% 07/02/2022 73.8 -1.4 0.751 g/cm2 World Health Organization [facility]) criteria for post-menopausal, Caucasian Women: Normal       T-score at or above -1 SD Osteopenia   T-score between -1 and -2.5 SD Osteoporosis T-score at or below -2.5 SD RECOMMENDATION: 1. All patients should optimize  calcium  and vitamin D  intake. 2. Consider FDA-approved medical therapies in postmenopausal women and men aged 92 years and older, based on the following: a. A hip or vertebral (clinical or morphometric) fracture. b. T-score = -2.5 at the femoral neck or spine after appropriate evaluation to exclude secondary causes. c. Low bone mass (T-score between -1.0 and -2.5 at the femoral neck or spine) and a 10-year probability of a hip fracture = 3% or a 10-year probability of a major osteoporosis-related fracture = 20% based on the [place] -adapted WHO algorithm. d. Clinician judgment and/or patient preferences may indicate treatment for people with 10-year fracture probabilities above or below these levels. FOLLOW-UP: Patients with diagnosis of osteoporosis or at high risk for fracture should have regular bone mineral density tests.? Patients eligible for Medicare are allowed routine testing every 2 years.? The testing frequency can be increased to one year for patients who have rapidly progressing disease, are receiving or discontinuing medical therapy to restore bone mass, or have additional risk factors. I have reviewed this study and agree with the findings. [facility] Electronically Signed   By: Norman Hopper M.D.   On: 07/02/2022 14:04    Assessment & Plan:   Encounter for general adult medical examination with abnormal findings- Exam completed, labs reviewed, vaccines reviewed, no cancer screenings indicated, pt ed material was given.   Primary hypertension- BP is adequately well controlled. -     EKG 12-Lead -     Basic metabolic panel with GFR; Future -     CBC with Differential/Platelet; Future -     TSH; Future -     Hepatic function panel; Future -     Urinalysis, Routine w reflex microscopic; Future  Age-related osteoporosis without current pathological fracture -     Basic metabolic panel with GFR; Future -     Phosphorus; Future -     VITAMIN D  25 Hydroxy (Vit-D Deficiency,  Fractures); Future -     Denosumab  -     Fosteum Plus; Take 1 capsule by mouth in the morning and at bedtime.  Dispense: 60 capsule; Refill: 5  Hyperlipidemia LDL goal <100- LDL goal achieved. Doing well on the statin  -  Lipid panel; Future -     TSH; Future -     Hepatic function panel; Future  Nasal polyposis -     Xhance ; Place 2 Inhalations into the nose 2 (two) times daily.  Dispense: 48 mL; Refill: 1 -     methylPREDNISolone ; TAKE AS DIRECTED  Dispense: 21 tablet; Refill: 0 -     Ambulatory referral to ENT  Allergic rhinitis due to pollen, unspecified seasonality -     methylPREDNISolone ; TAKE AS DIRECTED  Dispense: 21 tablet; Refill: 0  Stage 3a chronic kidney disease (HCC) -     Urinalysis, Routine w reflex microscopic; Future  Acute non-recurrent maxillary sinusitis -     Cefpodoxime  Proxetil; Take 1 tablet (200 mg total) by mouth 2 (two) times daily for 10 days.  Dispense: 20 tablet; Refill: 0     Follow-up: Return in about 6 months (around 11/24/2024).  Debby Molt, MD

## 2024-05-27 ENCOUNTER — Encounter: Payer: Self-pay | Admitting: Internal Medicine

## 2024-05-27 ENCOUNTER — Telehealth: Payer: Self-pay

## 2024-05-27 ENCOUNTER — Other Ambulatory Visit (HOSPITAL_COMMUNITY): Payer: Self-pay

## 2024-05-27 NOTE — Telephone Encounter (Signed)
 Prolia  VOB initiated via [email or web address]  Next Prolia  inj DUE: NEW START   PHARMACY: $645.79

## 2024-05-27 NOTE — Telephone Encounter (Signed)
 Pt ready for scheduling for PROLIA  on or after : 05/27/24  Option# 1: Buy/Bill (Office supplied medication)  Out-of-pocket cost due at time of clinic visit: $357  Number of injection/visits approved: 2  Primary: [employer] Prolia  co-insurance: 20% Admin fee co-insurance: 20%  Secondary: --- Prolia  co-insurance:  Admin fee co-insurance:   Medical Benefit Details: Date Benefits were checked: 05/27/24 Deductible: NO/ Coinsurance: 20%/ Admin Fee: 20%  Prior Auth: APPROVED PA# [id number] Expiration Date: 05/27/24-05/27/25  # of doses approved: 2 ----------------------------------------------------------------------- Option# 2- Med Obtained from pharmacy:  Pharmacy benefit: Copay $645.79 (Paid to pharmacy) Admin Fee: 20% (Pay at clinic)  Prior Auth: N/A PA# Expiration Date:   # of doses approved:   If patient wants fill through the pharmacy benefit please send prescription to: [facility], and include estimated need by date in rx notes. Pharmacy will ship medication directly to the office.  Patient NOT eligible for Prolia  Copay Card. Copay Card can make patient's cost as little as $25. Link to apply: [email or web address]  ** This summary of benefits is an estimation of the patient's out-of-pocket cost. Exact cost may very based on individual plan coverage.

## 2024-05-27 NOTE — Telephone Encounter (Addendum)
 MEDICAL PA SUBMITTED VIA NOVOLOGIX (LATENT).  Authorization Number : [id number]    APPROVED -

## 2024-05-27 NOTE — Telephone Encounter (Signed)
 Jessica Thompson

## 2024-06-21 ENCOUNTER — Telehealth: Payer: Self-pay

## 2024-06-21 ENCOUNTER — Other Ambulatory Visit (HOSPITAL_COMMUNITY): Payer: Self-pay

## 2024-06-21 NOTE — Telephone Encounter (Signed)
 Copied from CRM #[id number]. Topic: Clinical - Medication Prior Auth >> Jun 21, 2024  3:13 PM Tysheama G wrote: Reason for CRM: Patient needs a PA for her medication budesonide  (PULMICORT ) 180 MCG/ACT inhaler for the year of 2026.

## 2024-06-24 ENCOUNTER — Other Ambulatory Visit: Payer: Self-pay | Admitting: Internal Medicine

## 2024-06-24 ENCOUNTER — Other Ambulatory Visit (HOSPITAL_COMMUNITY): Payer: Self-pay

## 2024-06-24 ENCOUNTER — Telehealth: Payer: Self-pay

## 2024-06-24 DIAGNOSIS — J4520 Mild intermittent asthma, uncomplicated: Secondary | ICD-10-CM

## 2024-06-24 NOTE — Telephone Encounter (Signed)
 Patient has been made aware Via voice message on her VM.

## 2024-06-24 NOTE — Telephone Encounter (Signed)
 Pharmacy Patient Advocate Encounter   Received notification from Pt Calls Messages that prior authorization for Pulmicort  Flexhaler 180mcg is required/requested.   Insurance verification completed.   The patient is insured through [facility].   Per test claim: PA required; PA submitted to above mentioned insurance via Latent Key/confirmation #/EOC [id number] Status is pending

## 2024-06-24 NOTE — Telephone Encounter (Signed)
 Pharmacy Patient Advocate Encounter  Received notification from [facility] that Prior Authorization for Pulmicort  Flexhaler has been APPROVED from 06/24/24 to 06/22/25   PA #/Case ID/Reference #: [id number]

## 2024-06-27 ENCOUNTER — Other Ambulatory Visit (HOSPITAL_COMMUNITY): Payer: Self-pay

## 2024-07-17 ENCOUNTER — Other Ambulatory Visit: Payer: Self-pay | Admitting: Internal Medicine

## 2024-07-17 DIAGNOSIS — E785 Hyperlipidemia, unspecified: Secondary | ICD-10-CM

## 2024-11-11 ENCOUNTER — Ambulatory Visit
